# Patient Record
Sex: Female | Born: 1950 | Race: White | Hispanic: No | Marital: Married | Smoking: Never smoker
Health system: Southern US, Community
[De-identification: ages and names within clinical notes are randomized; demographics above are authoritative.]

## PROBLEM LIST (undated history)

## (undated) DIAGNOSIS — I10 Essential (primary) hypertension: Secondary | ICD-10-CM

## (undated) DIAGNOSIS — R55 Syncope and collapse: Secondary | ICD-10-CM

## (undated) DIAGNOSIS — R252 Cramp and spasm: Secondary | ICD-10-CM

## (undated) DIAGNOSIS — Z9289 Personal history of other medical treatment: Secondary | ICD-10-CM

## (undated) DIAGNOSIS — J349 Unspecified disorder of nose and nasal sinuses: Secondary | ICD-10-CM

## (undated) DIAGNOSIS — R911 Solitary pulmonary nodule: Secondary | ICD-10-CM

## (undated) DIAGNOSIS — C4491 Basal cell carcinoma of skin, unspecified: Secondary | ICD-10-CM

## (undated) DIAGNOSIS — I208 Other forms of angina pectoris: Secondary | ICD-10-CM

## (undated) DIAGNOSIS — I2089 Other forms of angina pectoris: Secondary | ICD-10-CM

## (undated) DIAGNOSIS — E7800 Pure hypercholesterolemia, unspecified: Secondary | ICD-10-CM

## (undated) DIAGNOSIS — Z9889 Other specified postprocedural states: Secondary | ICD-10-CM

## (undated) DIAGNOSIS — C4490 Unspecified malignant neoplasm of skin, unspecified: Secondary | ICD-10-CM

## (undated) HISTORY — PX: CARDIAC CATHETERIZATION: SHX172

## (undated) HISTORY — DX: Other forms of angina pectoris: I20.89

## (undated) HISTORY — DX: Other forms of angina pectoris: I20.8

## (undated) HISTORY — PX: ABDOMINAL HYSTERECTOMY: SHX81

## (undated) HISTORY — DX: Unspecified disorder of nose and nasal sinuses: J34.9

## (undated) HISTORY — PX: CHOLECYSTECTOMY: SHX55

## (undated) HISTORY — DX: Syncope and collapse: R55

## (undated) HISTORY — PX: BASAL CELL CARCINOMA EXCISION: SHX1214

## (undated) HISTORY — DX: Essential (primary) hypertension: I10

## (undated) HISTORY — DX: Pure hypercholesterolemia, unspecified: E78.00

## (undated) HISTORY — DX: Personal history of other medical treatment: Z92.89

## (undated) HISTORY — DX: Basal cell carcinoma of skin, unspecified: C44.91

## (undated) HISTORY — DX: Solitary pulmonary nodule: R91.1

## (undated) HISTORY — DX: Cramp and spasm: R25.2

## (undated) HISTORY — PX: SQUAMOUS CELL CARCINOMA EXCISION: SHX2433

## (undated) HISTORY — DX: Other specified postprocedural states: Z98.890

## (undated) HISTORY — DX: Unspecified malignant neoplasm of skin, unspecified: C44.90

---

## 1998-05-04 ENCOUNTER — Other Ambulatory Visit: Admission: RE | Admit: 1998-05-04 | Discharge: 1998-05-04 | Payer: Self-pay | Admitting: *Deleted

## 2000-01-19 ENCOUNTER — Encounter: Admission: RE | Admit: 2000-01-19 | Discharge: 2000-01-19 | Payer: Self-pay | Admitting: Emergency Medicine

## 2000-01-19 ENCOUNTER — Encounter: Payer: Self-pay | Admitting: Emergency Medicine

## 2000-02-21 HISTORY — PX: CARDIAC CATHETERIZATION: SHX172

## 2000-05-09 ENCOUNTER — Other Ambulatory Visit: Admission: RE | Admit: 2000-05-09 | Discharge: 2000-05-09 | Payer: Self-pay | Admitting: *Deleted

## 2000-05-11 ENCOUNTER — Ambulatory Visit (HOSPITAL_COMMUNITY): Admission: RE | Admit: 2000-05-11 | Discharge: 2000-05-11 | Payer: Self-pay | Admitting: Cardiology

## 2000-05-29 ENCOUNTER — Ambulatory Visit (HOSPITAL_COMMUNITY): Admission: RE | Admit: 2000-05-29 | Discharge: 2000-05-29 | Payer: Self-pay | Admitting: Cardiology

## 2000-05-29 ENCOUNTER — Encounter: Payer: Self-pay | Admitting: Cardiology

## 2000-06-15 ENCOUNTER — Encounter: Payer: Self-pay | Admitting: Emergency Medicine

## 2000-06-15 ENCOUNTER — Encounter: Admission: RE | Admit: 2000-06-15 | Discharge: 2000-06-15 | Payer: Self-pay | Admitting: Emergency Medicine

## 2000-07-13 ENCOUNTER — Encounter: Payer: Self-pay | Admitting: Emergency Medicine

## 2000-07-13 ENCOUNTER — Encounter: Admission: RE | Admit: 2000-07-13 | Discharge: 2000-07-13 | Payer: Self-pay | Admitting: Emergency Medicine

## 2000-11-01 ENCOUNTER — Ambulatory Visit (HOSPITAL_COMMUNITY): Admission: RE | Admit: 2000-11-01 | Discharge: 2000-11-01 | Payer: Self-pay | Admitting: Cardiology

## 2002-04-10 ENCOUNTER — Encounter: Payer: Self-pay | Admitting: Emergency Medicine

## 2002-04-10 ENCOUNTER — Encounter: Admission: RE | Admit: 2002-04-10 | Discharge: 2002-04-10 | Payer: Self-pay | Admitting: Emergency Medicine

## 2002-04-10 ENCOUNTER — Other Ambulatory Visit: Admission: RE | Admit: 2002-04-10 | Discharge: 2002-04-10 | Payer: Self-pay | Admitting: *Deleted

## 2003-04-23 ENCOUNTER — Encounter: Admission: RE | Admit: 2003-04-23 | Discharge: 2003-04-23 | Payer: Self-pay | Admitting: Emergency Medicine

## 2003-06-02 ENCOUNTER — Encounter: Admission: RE | Admit: 2003-06-02 | Discharge: 2003-06-02 | Payer: Self-pay | Admitting: Emergency Medicine

## 2003-06-04 ENCOUNTER — Other Ambulatory Visit: Admission: RE | Admit: 2003-06-04 | Discharge: 2003-06-04 | Payer: Self-pay | Admitting: Obstetrics and Gynecology

## 2006-02-20 DIAGNOSIS — R55 Syncope and collapse: Secondary | ICD-10-CM

## 2006-02-20 HISTORY — DX: Syncope and collapse: R55

## 2006-03-15 ENCOUNTER — Encounter: Admission: RE | Admit: 2006-03-15 | Discharge: 2006-03-15 | Payer: Self-pay | Admitting: Cardiology

## 2007-01-08 ENCOUNTER — Encounter: Admission: RE | Admit: 2007-01-08 | Discharge: 2007-01-08 | Payer: Self-pay | Admitting: Obstetrics and Gynecology

## 2008-02-17 ENCOUNTER — Encounter: Admission: RE | Admit: 2008-02-17 | Discharge: 2008-02-17 | Payer: Self-pay | Admitting: Obstetrics and Gynecology

## 2008-09-06 ENCOUNTER — Ambulatory Visit: Payer: Self-pay | Admitting: Internal Medicine

## 2008-09-07 ENCOUNTER — Inpatient Hospital Stay (HOSPITAL_COMMUNITY): Admission: EM | Admit: 2008-09-07 | Discharge: 2008-09-07 | Payer: Self-pay | Admitting: Emergency Medicine

## 2010-01-12 ENCOUNTER — Ambulatory Visit: Payer: Self-pay | Admitting: Cardiology

## 2010-05-26 ENCOUNTER — Other Ambulatory Visit: Payer: Self-pay | Admitting: Obstetrics and Gynecology

## 2010-05-26 DIAGNOSIS — Z1231 Encounter for screening mammogram for malignant neoplasm of breast: Secondary | ICD-10-CM

## 2010-05-29 LAB — TROPONIN I: Troponin I: 0.01 ng/mL (ref 0.00–0.06)

## 2010-05-29 LAB — CARDIAC PANEL(CRET KIN+CKTOT+MB+TROPI)
CK, MB: 2.9 ng/mL (ref 0.3–4.0)
Relative Index: 1.2 (ref 0.0–2.5)
Total CK: 236 U/L — ABNORMAL HIGH (ref 7–177)
Troponin I: 0.02 ng/mL (ref 0.00–0.06)

## 2010-05-29 LAB — DIFFERENTIAL
Basophils Absolute: 0 10*3/uL (ref 0.0–0.1)
Basophils Relative: 0 % (ref 0–1)
Eosinophils Absolute: 0.2 10*3/uL (ref 0.0–0.7)
Eosinophils Relative: 4 % (ref 0–5)
Lymphocytes Relative: 44 % (ref 12–46)
Lymphs Abs: 2 10*3/uL (ref 0.7–4.0)
Monocytes Absolute: 0.4 10*3/uL (ref 0.1–1.0)
Monocytes Relative: 9 % (ref 3–12)
Neutro Abs: 2 10*3/uL (ref 1.7–7.7)
Neutrophils Relative %: 43 % (ref 43–77)

## 2010-05-29 LAB — POCT CARDIAC MARKERS
CKMB, poc: 2.2 ng/mL (ref 1.0–8.0)
Myoglobin, poc: 69.2 ng/mL (ref 12–200)
Troponin i, poc: <0.05 ng/mL (ref 0.00–0.09)
Troponin i, poc: <0.05 ng/mL (ref 0.00–0.09)

## 2010-05-29 LAB — BASIC METABOLIC PANEL
BUN: 8 mg/dL (ref 6–23)
Chloride: 109 mEq/L (ref 96–112)
Glucose, Bld: 96 mg/dL (ref 70–99)
Potassium: 3.5 mEq/L (ref 3.5–5.1)

## 2010-05-29 LAB — CBC
HCT: 37.5 % (ref 36.0–46.0)
Hemoglobin: 12.8 g/dL (ref 12.0–15.0)
MCHC: 34.2 g/dL (ref 30.0–36.0)
MCV: 88.6 fL (ref 78.0–100.0)
Platelets: 188 10*3/uL (ref 150–400)
RBC: 4.23 MIL/uL (ref 3.87–5.11)
RDW: 12.9 % (ref 11.5–15.5)
WBC: 4.6 10*3/uL (ref 4.0–10.5)

## 2010-05-29 LAB — CK TOTAL AND CKMB (NOT AT ARMC)
CK, MB: 3 ng/mL (ref 0.3–4.0)
Relative Index: 1.1 (ref 0.0–2.5)
Total CK: 264 U/L — ABNORMAL HIGH (ref 7–177)

## 2010-05-29 LAB — TSH: TSH: 2.358 u[IU]/mL (ref 0.350–4.500)

## 2010-06-01 ENCOUNTER — Ambulatory Visit: Payer: Self-pay

## 2010-06-03 ENCOUNTER — Ambulatory Visit
Admission: RE | Admit: 2010-06-03 | Discharge: 2010-06-03 | Disposition: A | Discharge status: Home / Self Care | Payer: PRIVATE HEALTH INSURANCE | Source: Ambulatory Visit | Attending: Obstetrics and Gynecology | Admitting: Obstetrics and Gynecology

## 2010-06-03 DIAGNOSIS — Z1231 Encounter for screening mammogram for malignant neoplasm of breast: Secondary | ICD-10-CM

## 2010-06-14 ENCOUNTER — Other Ambulatory Visit: Payer: Self-pay | Admitting: Cardiology

## 2010-06-14 DIAGNOSIS — E785 Hyperlipidemia, unspecified: Secondary | ICD-10-CM

## 2010-06-21 ENCOUNTER — Other Ambulatory Visit: Payer: Self-pay | Admitting: *Deleted

## 2010-06-21 DIAGNOSIS — E7800 Pure hypercholesterolemia, unspecified: Secondary | ICD-10-CM

## 2010-06-29 ENCOUNTER — Other Ambulatory Visit: Payer: Self-pay | Admitting: Cardiology

## 2010-07-05 NOTE — Discharge Summary (Signed)
[name], [name]              ACCOUNT NO.:  [id number]   MEDICAL RECORD NO.:  [id number]          PATIENT TYPE:  INP   LOCATION:  6532                         FACILITY:  [facility]   PHYSICIAN:  Colleen Can. Deborah Chalk, M.D.DATE OF BIRTH:  [birth year]   DATE OF ADMISSION:  09/06/2008  DATE OF DISCHARGE:  09/07/2008                               DISCHARGE SUMMARY   DISCHARGE DIAGNOSES:  1. Dizziness and headache with probable vasovagal response.  2. Hypertension.  3. 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  4. Hyperlipidemia.   HISTORY OF PRESENT ILLNESS:  [name] is a 60 year old white female who has  had a longstanding history of hypertension, hyperlipidemia as well as  being overweight.  She had cardiac catheterization in 2002 and had  normal coronaries, but has suffered from intermittent chest pain since  that time and is felt to have some degree of microvascular angina.  She  presents to the emergency room after having a headache that progressed  into dizziness associated with bilateral arm numbness and some mild  chest discomfort.  Her blood pressure was checked and was noted to be  over 200 systolic.  Her symptoms occurred while she was at church.  She  had been under more stress and being involved with more committee work  as well as most recently with [employer].  She was brought to  the hospital and was subsequently watched overnight for further  evaluation.   Please see the history and physical for further patient presentation and  profile.   LABORATORY DATA ON ADMISSION:  Her CBC was completely within normal  limits.  Her chemistries were within normal limits as well.  Troponins  were negative x2.  CK-MBs were negative x2.  TSH level is still pending.  Her chest x-ray showed no acute cardiopulmonary process.  Her CT scan of  the head showed no acute process except for mild sinusitis.  Her EKG was  unremarkable.   HOSPITAL COURSE:  The patient was admitted overnight.  Her blood  pressure was in the 140 systolic range.  She was started on some low-  dose hydrochlorothiazide.  She ruled out negative for myocardial  infarction.  By the following morning, her symptoms had basically abated  and she was back to her baseline.  She is felt to be a satisfactory  candidate for discharge today.  It will be our plan for her to have  outpatient stress testing later this week.   DISCHARGE CONDITION:  Stable.   DISCHARGE DIET:  Low-salt heart-healthy.   DISCHARGE MEDICATIONS:  We will be adding hydrochlorothiazide 25 mg a  day.  She will continue aspirin 81 mg a day, Lovaza 1 g three times a  day, Diovan 80 mg half a tablet daily, metoprolol ER 25 mg a day,  Crestor 5 mg a day, and Welchol 625 three tablets at bedtime.   Our office will make arrangements for her to have stress testing later  on this week.  We will then see her back in approximately 10 days,  certainly sooner  if any problems would arise in the interim.   Greater than 30 minutes was spent for discharge.      Sharlee Blew, N.P.      Colleen Can. Deborah Chalk, M.D.  Electronically Signed    LC/MEDQ  D:  09/07/2008  T:  09/07/2008  Job:  [id number]

## 2010-07-05 NOTE — H&P (Signed)
[name], [name] NO.:  [id number]   MEDICAL RECORD NO.:  [id number]          PATIENT TYPE:  EMS   LOCATION:  MAJO                         FACILITY:  [facility]   PHYSICIAN:  Wendi Snipes, MD DATE OF BIRTH:  Mar 12, 1950   DATE OF ADMISSION:  09/06/2008  DATE OF DISCHARGE:                              HISTORY & PHYSICAL   CARDIOLOGIST:  Colleen Can. Deborah Chalk, M.D.   CHIEF COMPLAINTS:  Dizziness and headache.   HISTORY OF PRESENT ILLNESS:  This is a 60 year old white female with a  history of chronic angina and hypertension who presents with dizziness  with the feeling of passing out, transient hypertension, and headache.  She states that she was sitting in church when the symptoms occurred.  The symptoms started with a headache, progressed to dizziness.  This was  associated with arm numbness and some mild chest discomfort.  She does  see Dr. Deborah Chalk for chronic angina and she had a normal cardiac  catheterization in 2002.  She never lost consciousness fully.  However,  blood pressure was checked and her systolic  was approximately 200.  She  does not report any otherwise, syncope, palpitations, increased lower  extremity edema, palpable paroxysmal nocturnal dyspnea, or orthopnea.  She also denies recent illnesses though she has been stressed at work  lately with the [employer].   PAST MEDICAL HISTORY:  1. Chronic angina, normal catheterization in 2002.  2. Hypertension.  3. Hyperlipidemia.   ALLERGIES:  No known drug allergies.   MEDICATIONS ON ADMISSION:  WelChol, Lovasa, Crestor, Lopressor, aspirin.  She did not know the doses   SOCIAL HISTORY:  She lives in [place] with her husband.  She works at  her church.  She does not smoke.   FAMILY HISTORY:  Mother had a myocardial infarction in her the 36s.  Her  father had his first myocardial infarction his 81s.   REVIEW OF SYSTEMS:  All 14 systems were reviewed, were negative except  as  mentioned in detail in the HPI.   PHYSICAL EXAMINATION:  VITAL SIGNS:  Blood pressure is 143/79,  respiratory rate is 16.  Her pulse is 59.  Oxygen saturation 98% on 2  liters nasal cannula.  GENERAL:  She is a 60 year old white female appearing her stated age, in  no acute distress.  HEENT:  Moist mucous membranes.  Pupils are equal, round, react to light  and accommodation.  Anicteric sclera.  NECK:  No jugular venous distention.  No thyromegaly.  CARDIOVASCULAR:  Regular rate and rhythm.  No murmurs, rubs or gallops.  LUNGS:  Clear to auscultation bilaterally.  ABDOMEN:  Nontender, nondistended.  Positive bowel sounds.  No masses.  EXTREMITIES:  No clubbing, cyanosis, or edema and 2+ pulses throughout.  NEUROLOGIC:  Alert and oriented x3.  Cranial nerves II-XII grossly  intact.  No focal neurologic deficits.  SKIN:  Warm, dry and intact.  No  rashes.  PSYCHIATRIC:  Mood and affect are appropriate.   RADIOLOGY:  Head CT without contrast showed no acute process with mild  sinusitis.  Chest x-ray showed no acute  cardiopulmonary process.   LABORATORY:  White blood cell count is 4.6, hematocrit is 37.5.  Her  platelet count is 188,000.  Her potassium is 3.5.  Her creatinine is  0.9.  Her cardiac markers are negative.   ASSESSMENT/PLAN:  This is a 60 year old white female with a history of  hypertension and angina, here with presyncope that was associated with  high blood pressure.  1. Presyncope.  This is very likely a vasovagal reaction;  however,      will check echocardiogram and thyroid panel.  Consider 24-hour      urine metanephrines to rule out pheochromocytoma if the symptoms      recur.  2. Hypertension.  Her blood pressure is currently mildly elevated.  We      add hydrochlorothiazide 25 mg daily to her regimen.      Wendi Snipes, MD  Electronically Signed     BHH/MEDQ  D:  09/07/2008  T:  09/07/2008  Job:  [id number]

## 2010-07-08 NOTE — Cardiovascular Report (Signed)
[place]  Patient:    [name], [name]                     MRN: [id number] Proc. Date: 05/11/00 Adm. Date:  [id number] Disc. Date: [id number] Attending:  Eleanora Neighbor                        Cardiac Catheterization  INDICATIONS:  Ms. Clifton James was referred for evaluation of a classical, exertional, anterior chest pain, classical for angina pectoris.  She has a history of hypertension.  She also has a history of hypercholesterolemia.  She has a strongly positive family history of heart disease.  PROCEDURE:  Left heart catheterization with selective coronary angiography, left ventricular angiography with abdominal aortography.  TYPE AND SITE OF ENTRY:  Percutaneous right femoral artery with Perclose.  CATHETERS:  A 6 French 4 curved Judkins right and left coronary catheters, 6 French pigtail ventriculographic catheter.  CONTRAST MATERIAL:  Omnipaque.  MEDICATIONS GIVEN PRIOR TO THE PROCEDURE:  Valium 10 mg p.o.  MEDICATIONS GIVEN DURING THE PROCEDURE:  Versed 2 mg IV.  COMMENTS:  The patient tolerated the procedure well.  HEMODYNAMIC DATA:  The aortic pressure was 129/68, LV is 130/16.  There was no aortic valve gradient noted on pullback.  ANGIOGRAPHIC DATA: 1. Left main:  Left main coronary artery is normal. 2. Left anterior descending:  The left anterior descending is a moderate sized    vessel that extends to and around the apex.  Somewhat tortuous distally.    There is a large diagonal vessel.  It is otherwise normal. 3. Intermediate coronary artery is normal. 4. Left circumflex:  Left circumflex is small but normal. 5. Right coronary artery:  Right coronary artery is a large dominant    vessel.  It is normal.  LEFT VENTRICULOGRAPHY:  Left ventricular angiogram was performed in the RAO position.  Overall cardiac size and silhouette were normal.  Left ventricular function was normal.  ABDOMINAL AORTOGRAM:  Abdominal aortogram  was performed using 30 cc of contrast at 20 cc/sec.  This revealed what was felt to be a significant right renal artery stenosis in the midportion of the vessel.  There was a LAO hand injection taken and it was very suggestive that that was in fact the situation.  It is hard to really define the exact severity of this lesion, but it dose appear to be quite focal in nature.  OVERALL IMPRESSION: 1. Normal left ventricular function. 2. Normal coronary arteries. 3. Probable right renal artery stenosis. DD:  05/11/00 TD:  05/14/00 Job: [id number]

## 2010-07-20 ENCOUNTER — Encounter: Payer: Self-pay | Admitting: *Deleted

## 2010-07-21 ENCOUNTER — Encounter: Payer: Self-pay | Admitting: Cardiology

## 2010-07-21 ENCOUNTER — Other Ambulatory Visit: Payer: Self-pay | Admitting: Cardiology

## 2010-07-21 ENCOUNTER — Ambulatory Visit (INDEPENDENT_AMBULATORY_CARE_PROVIDER_SITE_OTHER): Payer: PRIVATE HEALTH INSURANCE | Admitting: Cardiology

## 2010-07-21 ENCOUNTER — Other Ambulatory Visit (INDEPENDENT_AMBULATORY_CARE_PROVIDER_SITE_OTHER): Payer: PRIVATE HEALTH INSURANCE | Admitting: *Deleted

## 2010-07-21 DIAGNOSIS — E7800 Pure hypercholesterolemia, unspecified: Secondary | ICD-10-CM

## 2010-07-21 DIAGNOSIS — I209 Angina pectoris, unspecified: Secondary | ICD-10-CM | POA: Insufficient documentation

## 2010-07-21 DIAGNOSIS — I10 Essential (primary) hypertension: Secondary | ICD-10-CM

## 2010-07-21 DIAGNOSIS — E785 Hyperlipidemia, unspecified: Secondary | ICD-10-CM

## 2010-07-21 DIAGNOSIS — E782 Mixed hyperlipidemia: Secondary | ICD-10-CM | POA: Insufficient documentation

## 2010-07-21 LAB — HEPATIC FUNCTION PANEL
ALT: 25 U/L (ref 0–35)
AST: 34 U/L (ref 0–37)
Total Protein: 7 g/dL (ref 6.0–8.3)

## 2010-07-21 LAB — LDL CHOLESTEROL, DIRECT: Direct LDL: 159.9 mg/dL

## 2010-07-21 LAB — LIPID PANEL
Cholesterol: 225 mg/dL — ABNORMAL HIGH (ref 0–200)
HDL: 57 mg/dL (ref >39.00)
Total CHOL/HDL Ratio: 4
VLDL: 35.6 mg/dL (ref 0.0–40.0)

## 2010-07-21 LAB — BASIC METABOLIC PANEL
BUN: 13 mg/dL (ref 6–23)
Chloride: 104 mEq/L (ref 96–112)
Potassium: 3.6 mEq/L (ref 3.5–5.1)

## 2010-07-21 NOTE — Progress Notes (Signed)
Subjective:   [name] is seen today for followup visit. She has carried a diagnosis of microvascular angina with remote catheterization in 2002 which was normal. She had a stress Cardiolite study in July of 2010 which was normal. She had a vasovagal response in July of 2010 leading to hospitalization. In general, she has done well with weight loss and exercise program that has resulted in better control of blood pressure and hyperlipemia.  She currently has fatigue and shortness of breath. She has occasional chest discomfort but overall is doing reasonably well. 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 and in the past, that has led to periods of recurrent chest pain. I've encouraged her to continue to exercise and lose weight. She continues to have recurrent chest discomfort, she may be a candidate for repeat catheterization.  Current Outpatient Prescriptions  Medication Sig Dispense Refill  . aspirin 81 MG tablet Take 81 mg by mouth daily.        . Cetirizine HCl (ZYRTEC PO) Take 1 tablet by mouth as needed.        . colesevelam (WELCHOL) 625 MG tablet Take 1,875 mg by mouth at bedtime.        . hydrochlorothiazide 25 MG tablet Take 25 mg by mouth daily.        . metoprolol succinate (TOPROL-XL) 25 MG 24 hr tablet 25 mg daily.       Marland Kitchen omega-3 acid ethyl esters (LOVAZA) 1 G capsule Take 2 g by mouth at bedtime.        . rosuvastatin (CRESTOR) 5 MG tablet 1/2 TABLET EVERY OTHER DAY OR AS DIRECTED  15 each  3  . valsartan (DIOVAN) 40 MG tablet Take 20 mg by mouth daily.        Marland Kitchen DISCONTD: metoprolol succinate (TOPROL-XL) 25 MG 24 hr tablet TAKE ONE TABLET BY MOUTH TWICE DAILY  60 tablet  6    No Known Allergies  There is no problem list on file for this patient.   History  Smoking status  . Never Smoker   Smokeless tobacco  . Never Used    History  Alcohol Use No    Family History  Problem Relation Age of Onset  . Heart disease Father   . Heart attack  Mother     questionable  . Lung cancer Mother   . Coronary artery disease      strong family history     Review of Systems:   The patient denies any heat or cold intolerance.  No weight gain or weight loss.  The patient denies headaches or blurry vision.  There is no cough or sputum production.  The patient denies dizziness.  There is no hematuria or hematochezia.  The patient denies any muscle aches or arthritis.  The patient denies any rash.  The patient denies frequent falling or instability.  There is no history of depression or anxiety.  All other systems were reviewed and are negative.   Physical Exam:   Weight is 171. Blood pressure 116/70 sitting, heart rate 60.The head is normocephalic and atraumatic.  Pupils are equally round and reactive to light.  Sclerae nonicteric.  Conjunctiva is clear.  Oropharynx is unremarkable.  There's adequate oral airway.  Neck is supple there are no masses.  Thyroid is not enlarged.  There is no lymphadenopathy.  Lungs are clear.  Chest is symmetric.  Heart shows a regular rate and rhythm.  S1 and S2 are normal.  There is no murmur click or gallop.  Abdomen is soft normal bowel sounds.  There is no organomegaly.  Genital and rectal deferred.  Extremities are without edema.  Peripheral pulses are adequate.  Neurologically intact.  Full range of motion.  The patient is not depressed.  Skin is warm and dry.  Assessment / Plan:

## 2010-07-21 NOTE — Assessment & Plan Note (Signed)
We'll continue Crestor. We'll defer management of lipids to primary care with Dr.Badger

## 2010-07-21 NOTE — Assessment & Plan Note (Signed)
She has long history of intermittent chest discomfort and shortness of breath and fatigue. She had a negative catheterization was similar symptoms in 2002 and has had followup negative stress Cardiolite studies. We have treated this as a microvascular angina and she has improved with exercise and weight loss. I've encouraged her to return exercise and allow enough margin in her life for some appropriate rest.

## 2010-07-29 ENCOUNTER — Telehealth: Payer: Self-pay | Admitting: *Deleted

## 2010-07-29 NOTE — Telephone Encounter (Signed)
Message copied by Adolphus Birchwood on Fri Jul 29, 2010  2:00 PM ------      Message from: Roger Shelter      Created: Wed Jul 27, 2010  8:43 AM       Work on diet, recheck in six months.

## 2010-07-29 NOTE — Telephone Encounter (Signed)
Left message on cell # with lab results and to continue to work on diet.  Pt told to call back with any concerns and to have labs rechecked in six months.

## 2010-08-16 ENCOUNTER — Other Ambulatory Visit: Payer: Self-pay | Admitting: Cardiology

## 2010-10-19 ENCOUNTER — Ambulatory Visit (INDEPENDENT_AMBULATORY_CARE_PROVIDER_SITE_OTHER): Payer: PRIVATE HEALTH INSURANCE | Admitting: Cardiology

## 2010-10-19 ENCOUNTER — Encounter: Payer: Self-pay | Admitting: Cardiology

## 2010-10-19 DIAGNOSIS — R079 Chest pain, unspecified: Secondary | ICD-10-CM

## 2010-10-19 DIAGNOSIS — I10 Essential (primary) hypertension: Secondary | ICD-10-CM

## 2010-10-19 DIAGNOSIS — I209 Angina pectoris, unspecified: Secondary | ICD-10-CM

## 2010-10-19 DIAGNOSIS — I208 Other forms of angina pectoris: Secondary | ICD-10-CM

## 2010-10-19 DIAGNOSIS — E785 Hyperlipidemia, unspecified: Secondary | ICD-10-CM

## 2010-10-19 MED ORDER — NITROGLYCERIN 0.4 MG SL SUBL: 0.4000 mg | SUBLINGUAL_TABLET | SUBLINGUAL | Status: DC | PRN

## 2010-10-19 MED ORDER — ROSUVASTATIN CALCIUM 5 MG PO TABS: ORAL_TABLET | ORAL | Status: DC

## 2010-10-19 MED ORDER — ISOSORBIDE MONONITRATE ER 30 MG PO TB24: 30.0000 mg | ORAL_TABLET | Freq: Every day | ORAL | Status: DC

## 2010-10-19 NOTE — Assessment & Plan Note (Addendum)
Patient has chronic chest pain with moderate to heavy exertion.   This has been present for over 10 years.  She is currently at her baseline.  She had a normal cath in 3/02 and a normal cardiolite in 2012.  She is thought to have microvascular angina.  It is responsive to nitrates.  She is on a beta blocker, ARB, and low dose statin.  I am going to have her try Imdur 30 mg daily to see if it helps her symptoms.  If this does not help, ranolazine may be an option.  She should continue ASA 81 mg daily.

## 2010-10-19 NOTE — Patient Instructions (Signed)
Increase Crestor to 5mg  every other day.  Start Imdur (isosorbide) 30mg  daily.   Use nitroglycerin as needed for chest pain.  Schedule an appointment for fasting lab in 2 months--lipid profile/liver profile 786.50  413.0  Your physician wants you to follow-up in: 6 months with Dr Shirlee Latch. (February [id number]. You will receive a reminder letter in the mail two months in advance. If you don't receive a letter, please call our office to schedule the follow-up appointment.

## 2010-10-19 NOTE — Assessment & Plan Note (Signed)
Patient has probable microvascular angina as well as significantly elevated LDL.  She has a possible family history of premature CAD.  I think that her LDL should at least be below 100.  I am going to increase her statin to Crestor 5 mg qod with repeat lipids/LFTs in 2 months.  If she tolerates this, increase to 5 mg daily.  Increased statin may also help with microvascular angina via its positive effect on the endothelium.

## 2010-10-19 NOTE — Progress Notes (Signed)
PCP: Dr. Cyndia Bent  60 yo with history of microvascular angina and hyperlipidemia presents for cardiology followup.  She has been seen by Dr. Deborah Chalk in the past and is seen by me for the first time today.  Earlier in the summer, she had been having more chest pain symptoms.  Over the last few weeks, she has returned to baseline.  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  She has had chest pain for over 10 years.  She will get tightness in her chest sometimes radiating to the left arm as well as dyspnea when walking up hills or if she carries a heavy load up steps.  This tends to resolve if she takes nitroglycerine.  She walks 30-40 minutes a day for exercise with her husband and had no chest pain with this.  She rarely (1-2 times a year) gets a severe episode of chest pain at rest.  She had one of these several weeks ago.    She has had trouble in the past tolerating statins.  She is doing well now on a very low dose of Crestor but LDL is still high.   Labs (5/12): K 3.6, creatinine 0.9, TGs 178, HDL 57, LDL 160  ECG: NSR, normal  PMH: 1. Microvascular angina: Patient had left heart cath in 3/02 with normal coronaries.  She had a stress cardiolite in 7/10 that was normal.   2. Right renal artery stenosis noted on 3/12 cath 3. HTN 4. Vasovagal symptoms 5. Hyperlipidemia: Myalgias with several statins, tolerating low dose Crestor.   SH: Married, lives with husband [place] of [place].  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  3 daughters.  Nonsmoker, no ETOH.   FH: Mother died at 98 with lung cancer, she thinks mother had an MI in her 30s.  Father had an MI in his 8s.   ROS: All systems reviewed and negative except as per HPI.   Current Outpatient Prescriptions  Medication Sig Dispense Refill  . aspirin 81 MG tablet Take 81 mg by mouth daily.        . Cetirizine HCl (ZYRTEC PO) Take 1 tablet by mouth as needed.         . hydrochlorothiazide 25 MG tablet Take 25 mg by mouth daily.        . metoprolol succinate (TOPROL-XL) 25 MG 24 hr tablet 25 mg daily.       Marland Kitchen omega-3 acid ethyl esters (LOVAZA) 1 G capsule Take 2 g by mouth at bedtime.        . valsartan (DIOVAN) 80 MG tablet Take 20 mg by mouth daily.        . WELCHOL 625 MG tablet TAKE THREE TABLETS BY MOUTH EVERY DAY.  90 each  5  . DISCONTD: rosuvastatin (CRESTOR) 5 MG tablet 1/2 TABLET EVERY OTHER DAY OR AS DIRECTED  15 each  3  . isosorbide mononitrate (IMDUR) 30 MG 24 hr tablet Take 1 tablet (30 mg total) by mouth daily.  30 tablet  6  . nitroGLYCERIN (NITROSTAT) 0.4 MG SL tablet Place 1 tablet (0.4 mg total) under the tongue every 5 (five) minutes as needed for chest pain.  100 tablet  3  . rosuvastatin (CRESTOR) 5 MG tablet Take one tablet every other day  15 tablet  3    BP 104/69  Pulse 63  Ht 5\' 4"  (1.626 m)  Wt 172 lb  12.8 oz (78.382 kg)  BMI 29.66 kg/m2 General: NAD Neck: No JVD, no thyromegaly or thyroid nodule.  Lungs: Clear to auscultation bilaterally with normal respiratory effort. CV: Nondisplaced PMI.  Heart regular S1/S2, no S3/S4, no murmur.  No peripheral edema.  No carotid bruit.  Normal pedal pulses.  Abdomen: Soft, nontender, no hepatosplenomegaly, no distention.  Neurologic: Alert and oriented x 3.  Psych: Normal affect. Extremities: No clubbing or cyanosis.

## 2010-10-19 NOTE — Assessment & Plan Note (Signed)
BP is under good control.  Patient has possible right renal artery stenosis, but at this point I do not think there is an indication for further evaluation given well-controlled BP and normal creatinine.   Followup in 6 months.

## 2010-10-28 ENCOUNTER — Other Ambulatory Visit: Payer: Self-pay | Admitting: Nurse Practitioner

## 2010-12-15 ENCOUNTER — Other Ambulatory Visit (INDEPENDENT_AMBULATORY_CARE_PROVIDER_SITE_OTHER): Payer: PRIVATE HEALTH INSURANCE | Admitting: *Deleted

## 2010-12-15 DIAGNOSIS — I208 Other forms of angina pectoris: Secondary | ICD-10-CM

## 2010-12-15 DIAGNOSIS — R079 Chest pain, unspecified: Secondary | ICD-10-CM

## 2010-12-15 LAB — HEPATIC FUNCTION PANEL
ALT: 25 U/L (ref 0–35)
AST: 30 U/L (ref 0–37)
Alkaline Phosphatase: 45 U/L (ref 39–117)
Bilirubin, Direct: 0 mg/dL (ref 0.0–0.3)
Total Bilirubin: 0.5 mg/dL (ref 0.3–1.2)
Total Protein: 7.2 g/dL (ref 6.0–8.3)

## 2010-12-15 LAB — LIPID PANEL: Cholesterol: 196 mg/dL (ref 0–200)

## 2010-12-19 ENCOUNTER — Telehealth: Payer: Self-pay | Admitting: *Deleted

## 2010-12-19 DIAGNOSIS — E785 Hyperlipidemia, unspecified: Secondary | ICD-10-CM

## 2010-12-19 MED ORDER — ROSUVASTATIN CALCIUM 5 MG PO TABS: 5.0000 mg | ORAL_TABLET | Freq: Every day | ORAL | Status: DC

## 2010-12-19 NOTE — Telephone Encounter (Signed)
Notes Recorded by Jacqlyn Krauss, RN on 12/19/2010 at 2:36 PM Pt will increase crestor to 5mg  daily. She will return for fasting lipid/liver profile 02/16/11. Notes Recorded by Jacqlyn Krauss, RN on 12/19/2010 at 1:58 PM I discussed results with pt. Notes Recorded by Marca Ancona, MD on 12/18/2010 at 11:04 PM Better LDL. Would increase Crestor to 5 mg daily and check lipids/LFTs in 2 months.

## 2011-02-07 ENCOUNTER — Other Ambulatory Visit: Payer: Self-pay | Admitting: Nurse Practitioner

## 2011-02-07 DIAGNOSIS — R079 Chest pain, unspecified: Secondary | ICD-10-CM

## 2011-02-07 DIAGNOSIS — E785 Hyperlipidemia, unspecified: Secondary | ICD-10-CM

## 2011-02-07 DIAGNOSIS — I208 Other forms of angina pectoris: Secondary | ICD-10-CM

## 2011-02-07 MED ORDER — VALSARTAN 80 MG PO TABS: 20.0000 mg | ORAL_TABLET | Freq: Every day | ORAL | Status: DC

## 2011-02-07 MED ORDER — METOPROLOL SUCCINATE ER 25 MG PO TB24: 25.0000 mg | ORAL_TABLET | Freq: Every day | ORAL | Status: DC

## 2011-02-07 MED ORDER — OMEGA-3-ACID ETHYL ESTERS 1 G PO CAPS: 2.0000 g | ORAL_CAPSULE | Freq: Every day | ORAL | Status: DC

## 2011-02-07 MED ORDER — COLESEVELAM HCL 625 MG PO TABS: 1875.0000 mg | ORAL_TABLET | Freq: Every day | ORAL | Status: DC

## 2011-02-07 MED ORDER — ROSUVASTATIN CALCIUM 5 MG PO TABS: 5.0000 mg | ORAL_TABLET | Freq: Every day | ORAL | Status: DC

## 2011-02-16 ENCOUNTER — Other Ambulatory Visit: Payer: PRIVATE HEALTH INSURANCE | Admitting: *Deleted

## 2011-02-17 ENCOUNTER — Other Ambulatory Visit: Payer: Self-pay | Admitting: Cardiology

## 2011-04-12 ENCOUNTER — Other Ambulatory Visit (INDEPENDENT_AMBULATORY_CARE_PROVIDER_SITE_OTHER): Payer: PRIVATE HEALTH INSURANCE

## 2011-04-12 DIAGNOSIS — E785 Hyperlipidemia, unspecified: Secondary | ICD-10-CM

## 2011-04-12 LAB — LIPID PANEL
Cholesterol: 170 mg/dL (ref 0–200)
Total CHOL/HDL Ratio: 3
Triglycerides: 203 mg/dL — ABNORMAL HIGH (ref 0.0–149.0)

## 2011-04-12 LAB — HEPATIC FUNCTION PANEL
ALT: 26 U/L (ref 0–35)
Albumin: 4 g/dL (ref 3.5–5.2)
Total Bilirubin: 0.4 mg/dL (ref 0.3–1.2)

## 2011-04-19 ENCOUNTER — Ambulatory Visit (INDEPENDENT_AMBULATORY_CARE_PROVIDER_SITE_OTHER): Payer: PRIVATE HEALTH INSURANCE | Admitting: Cardiology

## 2011-04-19 ENCOUNTER — Encounter: Payer: Self-pay | Admitting: Cardiology

## 2011-04-19 VITALS — BP 110/78 | HR 63 | Ht 64.0 in | Wt 179.4 lb

## 2011-04-19 DIAGNOSIS — R079 Chest pain, unspecified: Secondary | ICD-10-CM

## 2011-04-19 DIAGNOSIS — E785 Hyperlipidemia, unspecified: Secondary | ICD-10-CM

## 2011-04-19 DIAGNOSIS — I209 Angina pectoris, unspecified: Secondary | ICD-10-CM

## 2011-04-19 NOTE — Patient Instructions (Addendum)
Your physician recommends that you return for  a FASTING lipid profile /BMET 786.50  This is scheduled for Tuesday August 27,2013 after 8:30am and before 1:30pm  Your physician wants you to follow-up in: 6 months with Dr Shirlee Latch.(August 2013 --a day or two after your lab on August 27,2013) You will receive a reminder letter in the mail two months in advance. If you don't receive a letter, please call our office to schedule the follow-up appointment.

## 2011-04-20 ENCOUNTER — Other Ambulatory Visit: Payer: Self-pay | Admitting: Cardiology

## 2011-04-20 NOTE — Assessment & Plan Note (Signed)
Patient has chronic chest pain with moderate to heavy exertion and also at rest.  She will have severe episodes several times a year.   This has been present for over 10 years.  She is currently at her baseline.  She had a normal cath in 3/02 and a normal cardiolite in 2012.  She is thought to have microvascular angina.  It is responsive to nitrates.  She is on a beta blocker, ARB, and low dose statin. She was unable to tolerate Imdur.  Ranolazine may be an option in the future.  She should continue ASA 81 mg daily.

## 2011-04-20 NOTE — Progress Notes (Signed)
PCP: Dr. Cyndia Bent  61 yo with history of microvascular angina and hyperlipidemia presents for cardiology followup. She has had chest pain for over 10 years.  She will get tightness in her chest sometimes radiating to the left arm as well as dyspnea when walking up hills or if she carries a heavy load up steps.  This tends to resolve if she takes nitroglycerin.  She walks 30-40 minutes a day for exercise with her husband and had no chest pain with this.  She rarely (1-2 times a year) gets a severe episode of chest pain at rest.   Since last appointment, she had 2 episodes of severe chest pain.   Both were at rest and both resolved with NTG.  She has had not had any recent chest pain.  At last appointment, I tried her on Imdur but she was unable to tolerate it because it made her heart feel like it was racing.  She is tolerating Crestor, alternating 5 mg daily with 2.5 mg daily.  She gets too much leg pain if she increases the Crestor to 5 mg daily.   Labs (5/12): K 3.6, creatinine 0.9, TGs 178, HDL 57, LDL 160 Labs (2/13): HDL 58, LDL 86, TGs 203  ECG: NSR, normal  PMH: 1. Microvascular angina: Patient had left heart cath in 3/02 with normal coronaries.  She had a stress cardiolite in 7/10 that was normal.  Unable to tolerate Imdur. 2. Right renal artery stenosis noted on 3/12 cath 3. HTN 4. Vasovagal symptoms 5. Hyperlipidemia: Myalgias with several statins, tolerating low dose Crestor.   SH: Married, lives with husband [place] of [place].  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  3 daughters.  Nonsmoker, no ETOH.   FH: Mother died at 49 with lung cancer, she thinks mother had an MI in her 30s.  Father had an MI in his 53s.    Current Outpatient Prescriptions  Medication Sig Dispense Refill  . aspirin 81 MG tablet Take 81 mg by mouth daily.        . Cetirizine HCl (ZYRTEC PO) Take 1 tablet by mouth as needed.        . colesevelam (WELCHOL) 625 MG tablet Take 3 tablets (1,875 mg  total) by mouth daily.  90 tablet  6  . hydrochlorothiazide 25 MG tablet TAKE ONE TABLET BY MOUTH EVERY DAY  30 tablet  5  . metoprolol succinate (TOPROL-XL) 25 MG 24 hr tablet Take 1 tablet (25 mg total) by mouth daily.  30 tablet  6  . nitroGLYCERIN (NITROSTAT) 0.4 MG SL tablet Place 1 tablet (0.4 mg total) under the tongue every 5 (five) minutes as needed for chest pain.  100 tablet  3  . rosuvastatin (CRESTOR) 5 MG tablet Take 1 tablet (5 mg total) by mouth daily.  30 tablet  6  . valsartan (DIOVAN) 80 MG tablet Take 0.5 tablets (40 mg total) by mouth daily.  60 tablet  6  . DISCONTD: omega-3 acid ethyl esters (LOVAZA) 1 G capsule Take 2 capsules (2 g total) by mouth daily.  60 capsule  6  . LOVAZA 1 G capsule TAKE 2 CAPSULES BY MOUTH ONCE DAILY  60 capsule  6    BP 110/78  Pulse 63  Ht 5\' 4"  (1.626 m)  Wt 179 lb 6.4 oz (81.375 kg)  BMI 30.79 kg/m2 General: NAD Neck: No JVD, no thyromegaly or thyroid nodule.  Lungs: Clear to auscultation bilaterally with normal respiratory  effort. CV: Nondisplaced PMI.  Heart regular S1/S2, no S3/S4, no murmur.  No peripheral edema.  No carotid bruit.  Normal pedal pulses.  Abdomen: Soft, nontender, no hepatosplenomegaly, no distention.  Neurologic: Alert and oriented x 3.  Psych: Normal affect. Extremities: No clubbing or cyanosis.

## 2011-04-20 NOTE — Assessment & Plan Note (Signed)
Lipids are much better on current statin regimen.  I would ideally like to see LDL < 70 (known vascular disease with renal artery stenosis), but she has not been able to tolerate a higher dose of Crestor.

## 2011-05-08 ENCOUNTER — Other Ambulatory Visit: Payer: Self-pay | Admitting: Nurse Practitioner

## 2011-06-01 ENCOUNTER — Other Ambulatory Visit: Payer: Self-pay | Admitting: Cardiology

## 2011-06-01 NOTE — Telephone Encounter (Signed)
Refilled diovan 

## 2011-07-10 ENCOUNTER — Other Ambulatory Visit: Payer: Self-pay | Admitting: Cardiology

## 2011-07-12 ENCOUNTER — Other Ambulatory Visit: Payer: Self-pay | Admitting: *Deleted

## 2011-07-12 DIAGNOSIS — E785 Hyperlipidemia, unspecified: Secondary | ICD-10-CM

## 2011-07-12 MED ORDER — ROSUVASTATIN CALCIUM 5 MG PO TABS: 5.0000 mg | ORAL_TABLET | Freq: Every day | ORAL | Status: DC

## 2011-10-17 ENCOUNTER — Ambulatory Visit (INDEPENDENT_AMBULATORY_CARE_PROVIDER_SITE_OTHER): Payer: PRIVATE HEALTH INSURANCE | Admitting: *Deleted

## 2011-10-17 DIAGNOSIS — I10 Essential (primary) hypertension: Secondary | ICD-10-CM

## 2011-10-17 DIAGNOSIS — E785 Hyperlipidemia, unspecified: Secondary | ICD-10-CM

## 2011-10-17 LAB — BASIC METABOLIC PANEL
BUN: 11 mg/dL (ref 6–23)
CO2: 30 mEq/L (ref 19–32)
Chloride: 103 mEq/L (ref 96–112)
Creatinine, Ser: 0.8 mg/dL (ref 0.4–1.2)
Glucose, Bld: 91 mg/dL (ref 70–99)

## 2011-10-17 LAB — LDL CHOLESTEROL, DIRECT: Direct LDL: 102.3 mg/dL

## 2011-10-18 ENCOUNTER — Other Ambulatory Visit: Payer: Self-pay | Admitting: Nurse Practitioner

## 2011-10-19 ENCOUNTER — Other Ambulatory Visit: Payer: Self-pay | Admitting: *Deleted

## 2011-10-19 DIAGNOSIS — E876 Hypokalemia: Secondary | ICD-10-CM

## 2011-11-03 ENCOUNTER — Other Ambulatory Visit (INDEPENDENT_AMBULATORY_CARE_PROVIDER_SITE_OTHER): Payer: PRIVATE HEALTH INSURANCE

## 2011-11-03 DIAGNOSIS — E876 Hypokalemia: Secondary | ICD-10-CM

## 2011-11-03 LAB — BASIC METABOLIC PANEL
CO2: 27 mEq/L (ref 19–32)
Chloride: 104 mEq/L (ref 96–112)
Sodium: 140 mEq/L (ref 135–145)

## 2011-11-06 ENCOUNTER — Ambulatory Visit: Payer: PRIVATE HEALTH INSURANCE | Admitting: Cardiology

## 2011-11-06 ENCOUNTER — Other Ambulatory Visit: Payer: Self-pay | Admitting: Cardiology

## 2011-11-06 MED ORDER — HYDROCHLOROTHIAZIDE 25 MG PO TABS: 25.0000 mg | ORAL_TABLET | Freq: Every day | ORAL | Status: DC

## 2011-11-14 ENCOUNTER — Ambulatory Visit: Payer: PRIVATE HEALTH INSURANCE | Admitting: Cardiology

## 2011-11-16 ENCOUNTER — Emergency Department (HOSPITAL_COMMUNITY)
Admission: EM | Admit: 2011-11-16 | Discharge: 2011-11-16 | Disposition: A | Discharge status: Home / Self Care | Payer: PRIVATE HEALTH INSURANCE | Attending: Emergency Medicine | Admitting: Emergency Medicine

## 2011-11-16 ENCOUNTER — Emergency Department (HOSPITAL_COMMUNITY): Payer: PRIVATE HEALTH INSURANCE

## 2011-11-16 ENCOUNTER — Encounter (HOSPITAL_COMMUNITY): Payer: Self-pay | Admitting: Emergency Medicine

## 2011-11-16 DIAGNOSIS — Z7982 Long term (current) use of aspirin: Secondary | ICD-10-CM | POA: Insufficient documentation

## 2011-11-16 DIAGNOSIS — I209 Angina pectoris, unspecified: Secondary | ICD-10-CM

## 2011-11-16 DIAGNOSIS — I2510 Atherosclerotic heart disease of native coronary artery without angina pectoris: Secondary | ICD-10-CM | POA: Insufficient documentation

## 2011-11-16 DIAGNOSIS — E785 Hyperlipidemia, unspecified: Secondary | ICD-10-CM

## 2011-11-16 DIAGNOSIS — R079 Chest pain, unspecified: Secondary | ICD-10-CM

## 2011-11-16 DIAGNOSIS — I10 Essential (primary) hypertension: Secondary | ICD-10-CM

## 2011-11-16 LAB — CBC WITH DIFFERENTIAL/PLATELET
Basophils Absolute: 0 10*3/uL (ref 0.0–0.1)
Basophils Relative: 1 % (ref 0–1)
Eosinophils Absolute: 0.2 10*3/uL (ref 0.0–0.7)
Eosinophils Relative: 4 % (ref 0–5)
HCT: 38.8 % (ref 36.0–46.0)
Hemoglobin: 13.2 g/dL (ref 12.0–15.0)
Lymphocytes Relative: 46 % (ref 12–46)
MCH: 29.5 pg (ref 26.0–34.0)
MCHC: 34 g/dL (ref 30.0–36.0)
Monocytes Absolute: 0.4 10*3/uL (ref 0.1–1.0)
Neutro Abs: 1.8 10*3/uL (ref 1.7–7.7)
RDW: 12.9 % (ref 11.5–15.5)

## 2011-11-16 LAB — COMPREHENSIVE METABOLIC PANEL
AST: 40 U/L — ABNORMAL HIGH (ref 0–37)
Albumin: 3.8 g/dL (ref 3.5–5.2)
Calcium: 9.8 mg/dL (ref 8.4–10.5)
Chloride: 103 mEq/L (ref 96–112)
Creatinine, Ser: 0.87 mg/dL (ref 0.50–1.10)
Total Protein: 7.2 g/dL (ref 6.0–8.3)

## 2011-11-16 LAB — TROPONIN I: Troponin I: <0.3 ng/mL (ref <0.30)

## 2011-11-16 LAB — CK TOTAL AND CKMB (NOT AT ARMC)
CK, MB: 2.4 ng/mL (ref 0.3–4.0)
Relative Index: 1.5 (ref 0.0–2.5)
Total CK: 160 U/L (ref 7–177)

## 2011-11-16 LAB — POCT I-STAT TROPONIN I

## 2011-11-16 MED ORDER — POTASSIUM CHLORIDE CRYS ER 20 MEQ PO TBCR
40.0000 meq | EXTENDED_RELEASE_TABLET | Freq: Once | ORAL | Status: AC
  Administered 2011-11-16: 40 meq via ORAL
  Filled 2011-11-16: qty 2

## 2011-11-16 MED ORDER — ASPIRIN 81 MG PO CHEW
324.0000 mg | CHEWABLE_TABLET | Freq: Once | ORAL | Status: AC
  Administered 2011-11-16: 324 mg via ORAL
  Filled 2011-11-16: qty 4

## 2011-11-16 NOTE — Consult Note (Signed)
Consult Note  Patient ID: [name] MRN: [id number], SOB: 1951/01/18 61 y.o. Date of Encounter: 11/16/2011, 4:05 PM  Primary Physician: Eartha Inch, MD Primary Cardiologist: Dr. Shirlee Latch  Chief Complaint: chest pain  HPI: 61 y.o. female w/ PMHx significant for chronic microvascular angina (No CAD by cath 2002, nl myoview 2010), HTN, HLD, Syncope, and probable right renal artery stenosis by cath who presented to [facility] on 11/16/2011 with complaints of chest pain.  She was last seen in clinic by Dr. Shirlee Latch on 04/19/11 at which time it was noted she has had chest pain for over 20yrs that can occur with exertion or at rest and usually resolves with NTG. She was tried on Imdur but could not tolerate it because of palpitations. It was noted that she may benefit from Ranexa.   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 Upon return on Sunday she developed nausea, vomiting and diarrhea ~ q30 mins for ~8hrs as well as fever and achy muscles. (Other people from the trip also developed these GI symptoms). From Monday until today she felt weak and achy, but no further n/v/d. She was able to tolerate liquids, but very little food. Today was feeling better and decided to go out to eat for some soup. Around 1pm she was in the car going out to eat when she developed sudden onset substernal chest pressure with radiation to her neck. Felt like she couldn't swallow and was sob. Took 2 SL NTG without relief so she presented to the ER. The pain was relieved by the time she arrived in the ER and lasted a total of . This was the same type of rest pain she has had in the past, but slightly more severe.   In the ED EKG revealed NSR 68bpm nonspecific T wave flattening V3-6. CXR is without acute cardiopulmonary abnormalities. Labs are significant for normal poc troponin, K+ 3.3, AST/ALT 40/40, otherwise unremarkable CBC/BMET. No further chest pain  since arrival.    Past Medical History  Diagnosis Date  . Hypertension   . Hypercholesteremia   . Syncope 2008    syncopal episode, probably related to excessive stress, activity, and fatigue      . 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 Shortness of breath   . Edema of lower extremity   . Fatigue   . Sinus problem   . History of cardiovascular stress test 09/09/2008    EF - 72%  /  normal nuclear study    2002 Cardiac Cath HEMODYNAMIC DATA: The aortic pressure was 129/68, LV is 130/16. There was no aortic valve gradient noted on pullback.  ANGIOGRAPHIC DATA:  1. Left main: Left main coronary artery is normal.  2. Left anterior descending: The left anterior descending is a moderate sized vessel that extends to and around the apex. Somewhat tortuous distally. There is a large diagonal vessel. It is otherwise normal.  3. Intermediate coronary artery is normal.  4. Left circumflex: Left circumflex is small but normal.  5. Right coronary artery: Right coronary artery is a large dominant vessel. It is normal.  LEFT VENTRICULOGRAPHY: Left ventricular angiogram was performed in the RAO position. Overall cardiac size and silhouette were normal. Left ventricular function was normal.  ABDOMINAL AORTOGRAM: Abdominal aortogram was performed using 30 cc of contrast at 20 cc/sec. This revealed what was felt to be a significant right renal  artery stenosis in the midportion of the vessel. There was a LAO hand injection taken and it was very suggestive that that was in fact the situation. It is hard to really define the exact severity of this lesion, but it dose appear to be quite focal in nature.  OVERALL IMPRESSION:  1. Normal left ventricular function.  2. Normal coronary arteries.  3. Probable right renal artery stenosis.  Surgical History:  Past Surgical History  Procedure Date  . Cardiac catheterization 2002    normal     Home Meds: Medication Sig  aspirin EC 81 MG  tablet Take 81 mg by mouth daily.  colesevelam (WELCHOL) 625 MG tablet Take 1,875 mg by mouth daily.  hydrochlorothiazide (HYDRODIURIL) 25 MG tablet Take 1 tablet (25 mg total) by mouth daily.  metoprolol succinate (TOPROL-XL) 25 MG 24 hr tablet Take 1 tablet (25 mg total) by mouth daily.  omega-3 acid ethyl esters (LOVAZA) 1 G capsule Take by mouth daily.  rosuvastatin (CRESTOR) 5 MG tablet Take 1 tablet (5 mg total) by mouth daily.  valsartan (DIOVAN) 80 MG tablet Take 20 mg by mouth daily.  nitroGLYCERIN (NITROSTAT) 0.4 MG SL tablet Place 1 tablet (0.4 mg total) under the tongue every 5 (five) minutes as needed for chest pain.    Allergies: No Known Allergies  History   Social History  . Marital Status: Married    Spouse Name: N/A    Number of Children: N/A  . Years of Education: N/A   Occupational History  . Not on file.   Social History Main Topics  . Smoking status: Never Smoker   . Smokeless tobacco: Never Used  . Alcohol Use: No  . Drug Use: No  . Sexually Active: Not on file   Other Topics Concern  . Not on file   Social History Narrative  . No narrative on file     Family History  Problem Relation Age of Onset  . Heart disease Father   . Heart attack Mother     questionable  . Lung cancer Mother   . Coronary artery disease      strong family history     Review of Systems: General: (+)  chills, fever; negative for night sweats or weight changes.  Cardiovascular: (+)  chest pain, shortness of breath, dyspnea on exertion; negative for edema, orthopnea, palpitations, paroxysmal nocturnal dyspnea  Dermatological: negative for rash Respiratory: negative for cough or wheezing Urologic: negative for hematuria Abdominal: (+)nausea, vomiting, diarrhea, abd pain; negative for  bright red blood per rectum, melena, or hematemesis Neurologic: negative for visual changes, syncope, or dizziness All other systems reviewed and are otherwise negative except as noted  above.  Labs:   Component Value Date   WBC 4.4 11/16/2011   HGB 13.2 11/16/2011   HCT 38.8 11/16/2011   MCV 86.8 11/16/2011   PLT 193 11/16/2011    Lab 11/16/11 1335  NA 141  K 3.3*  CL 103  CO2 29  BUN 11  CREATININE 0.87  CALCIUM 9.8  PROT 7.2  BILITOT 0.4  ALKPHOS 46  ALT 40*  AST 40*  GLUCOSE 91   No results found for this basename: CKTOTAL:4,CKMB:4,TROPONINI:4 in the last 72 hours  Radiology/Studies:   11/16/2011 - Chest 2 View Findings: Cardiomediastinal silhouette is unremarkable.  No acute infiltrate or pleural effusion.  No pulmonary edema.  Bony thorax is unremarkable.  IMPRESSION: No active disease.  No significant change.        EKG:  11/16/11 @ 1326 - NSR 68bpm T wave flattening V3-6  Physical Exam: Blood pressure 117/68, pulse 66, temperature 98.4 F (36.9 C), temperature source Oral, resp. rate 16, SpO2 94.00%. General: Well developed, white female in no acute distress. Head: Normocephalic, atraumatic, sclera non-icteric, nares are without discharge Neck: Supple. Negative for carotid bruits. JVD not elevated. Lungs: Clear bilaterally to auscultation without wheezes, rales, or rhonchi. Breathing is unlabored. Heart: RRR with S1 S2. No murmurs, rubs, or gallops appreciated. Abdomen: Soft, non-tender, non-distended with normoactive bowel sounds. No rebound/guarding. No obvious abdominal masses. Msk:  Strength and tone appear normal for age. Extremities: No edema. No clubbing or cyanosis. Distal pedal pulses are intact and equal bilaterally. Neuro: Alert and oriented X 3. Moves all extremities spontaneously. Psych:  Responds to questions appropriately with a normal affect.    ASSESSMENT AND PLAN:  61 y.o. female w/ PMHx significant for chronic microvascular angina (No CAD by cath 2002, nl myoview 2010), HTN, HLD, Syncope, and probable right renal artery stenosis by cath who presented to [facility] on 11/16/2011 with complaints of chest pain.  1. Chest  pain 2. Chronic microvascular angina with nl coronaries by cath 2002 and nl myoview 2010 3. Recent GI illness w/ n/v/d 4. Hypokalemia 5. Hypertension 6. Hyperlipidemia  Patient has history of chronic angina with normal coronaries by cath 2002 and nl myoview 2010. She had GI illness 4 days ago that she is now recovered from, but had sudden onset substernal chest pain at rest today that is identical to her chronic chest pain, but slightly more severe. It was eventually relieved with NTG after ~ 45 minutes. She is now chest pain free. Poc troponin is normal. She is afebrile without leukocytosis. CXR without acute findings. EKG does show nonspecific T wave flattening in V3-6, but no ST changes. Do not suspect ACS or myocarditis. She is not tachycardic, tachypneic, or hypoxic. No signs or symptoms of DVT. Low suspicion for PE. Will check one set of cardiac enzymes. If normal she can be discharged from a cardiac standpoint with follow up with Dr. Shirlee Latch (has appt already on 10/15). She was instructed to return to the ED if symptoms return and are not relieved with NTG. Supplement K+. Encourage po intake.   Signed, HOPE, JESSICA PA-C 11/16/2011, 4:05 PM  Patient seen and examined.  Agree with findings of J Hope as noted above. Patient with history of microvascular disease with angina.  Has intermittent CP relieved with NTG.  She is recovering for GI illness that was severe.  Today had episode of CP that was like her usual but to 2 NTG to resolve.   Currently asymptomatic.  On exam:  LUngs:  CTA; Cardiac:  RRR No S3.  No murmurs  Abd:  No signif tenderness.  Ext:  No edema EKG without acute changes.  Initial trop neg.  K was low at 3.3.  Overall I am not convinced of active angina.  Her symptms earlier today may have been a little more signif than usual because of recent illness (K low, may be a little dry).   I would recomm check I more trop  Replete K.  If lab ok D/C home.  Has  appt on Oct 14.in clinic  .  2.  HTN  Adequate control.  3.  HL  Continue statin.  Dietrich Pates 5:08 PM 11/16/2011

## 2011-11-16 NOTE — ED Notes (Signed)
I-stat troponin 0.00 

## 2011-11-16 NOTE — ED Notes (Signed)
Unable to talk to the doctor yet.  Now there is an admitting doctor at the bedside

## 2011-11-16 NOTE — ED Notes (Signed)
Pt alert talking to the med tech

## 2011-11-16 NOTE — ED Notes (Signed)
Updated pt of longest wait time in department.  Advised pt to notify this RN if any change in condition or needs. 

## 2011-11-16 NOTE — ED Notes (Signed)
Onset 4 days ago nausea emesis with fever and then diarrhea.  Today chest pain took nitro 2 nitro's with relief 0/10 chest pain however throat "closing feeling" Airway intact bilateral equal chest rise and fall.

## 2011-11-16 NOTE — ED Notes (Signed)
Pt up to the br no chest pain

## 2011-11-16 NOTE — ED Provider Notes (Signed)
History     CSN: [id number]  Arrival date & time 11/16/11  1321   First MD Initiated Contact with Patient 11/16/11 1508      Chief Complaint  Patient presents with  . Chest Pain    (Consider location/radiation/quality/duration/timing/severity/associated sxs/prior treatment) HPI Pt has history of microvascular coronary artery disease. She has episodic angina with exertion reliefed with rest and NTG. Today while at rest had substernal chest pressure radiating to neck around 1300 and lasting 45 min. Took NTG but was not as effective as normally is. Pt is currently pain free. Pain she had today is similar in character as previous anginal spells though longer and more intense. Last cath 2002.  Past Medical History  Diagnosis Date  . Hypertension   . Hypercholesteremia   . Syncope 2008    syncopal episode, probably related to excessive stress, activity, and fatigue      . 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 Shortness of breath   . Edema of lower extremity   . Fatigue   . Sinus problem   . History of cardiovascular stress test 09/09/2008    EF - 72%  /  normal nuclear study    Past Surgical History  Procedure Date  . Cardiac catheterization 2002    normal    Family History  Problem Relation Age of Onset  . Heart disease Father   . Heart attack Mother     questionable  . Lung cancer Mother   . Coronary artery disease      strong family history     History  Substance Use Topics  . Smoking status: Never Smoker   . Smokeless tobacco: Never Used  . Alcohol Use: No    OB History    Grav Para Term Preterm Abortions TAB SAB Ect Mult Living                  Review of Systems  Constitutional: Negative for fever and chills.  Respiratory: Positive for shortness of breath. Negative for cough.   Cardiovascular: Positive for chest pain. Negative for palpitations and leg swelling.  Gastrointestinal: Negative for nausea, vomiting, abdominal pain, diarrhea  and constipation.  Musculoskeletal: Negative for back pain.  Skin: Negative for pallor, rash and wound.  Neurological: Negative for dizziness, weakness, light-headedness, numbness and headaches.    Allergies  Review of patient's allergies indicates no known allergies.  Home Medications   Current Outpatient Rx  Name Route Sig Dispense Refill  . ASPIRIN EC 81 MG PO TBEC Oral Take 81 mg by mouth daily.    . COLESEVELAM HCL 625 MG PO TABS Oral Take 1,875 mg by mouth daily.    Marland Kitchen HYDROCHLOROTHIAZIDE 25 MG PO TABS Oral Take 1 tablet (25 mg total) by mouth daily. 30 tablet 2  . METOPROLOL SUCCINATE ER 25 MG PO TB24 Oral Take 1 tablet (25 mg total) by mouth daily. 90 tablet 2  . OMEGA-3-ACID ETHYL ESTERS 1 G PO CAPS Oral Take by mouth daily.    Marland Kitchen ROSUVASTATIN CALCIUM 5 MG PO TABS Oral Take 1 tablet (5 mg total) by mouth daily. 30 tablet 6  . VALSARTAN 80 MG PO TABS Oral Take 20 mg by mouth daily.    Marland Kitchen NITROGLYCERIN 0.4 MG SL SUBL Sublingual Place 1 tablet (0.4 mg total) under the tongue every 5 (five) minutes as needed for chest pain. 100 tablet 3    BP 145/66  Pulse 64  Temp 98.4  F (36.9 C) (Oral)  Resp 18  SpO2 100%  Physical Exam  Nursing note and vitals reviewed. Constitutional: She is oriented to person, place, and time. She appears well-developed and well-nourished. No distress.  HENT:  Head: Normocephalic and atraumatic.  Mouth/Throat: Oropharynx is clear and moist.  Eyes: EOM are normal. Pupils are equal, round, and reactive to light.  Neck: Normal range of motion. Neck supple.  Cardiovascular: Normal rate and regular rhythm.   Pulmonary/Chest: Effort normal and breath sounds normal. No respiratory distress. She has no wheezes. She has no rales.  Abdominal: Soft. Bowel sounds are normal. She exhibits no distension and no mass. There is no tenderness. There is no rebound and no guarding.  Musculoskeletal: Normal range of motion. She exhibits no edema and no tenderness.    Neurological: She is alert and oriented to person, place, and time.       No focal motor or sensory deficits  Skin: Skin is warm and dry. No rash noted. No erythema.  Psychiatric: She has a normal mood and affect. Her behavior is normal.    ED Course  Procedures (including critical care time)  Labs Reviewed  CBC WITH DIFFERENTIAL - Abnormal; Notable for the following:    Neutrophils Relative 40 (*)     All other components within normal limits  COMPREHENSIVE METABOLIC PANEL - Abnormal; Notable for the following:    Potassium 3.3 (*)     AST 40 (*)     ALT 40 (*)     GFR calc non Af Amer 71 (*)     GFR calc Af Amer 82 (*)     All other components within normal limits  POCT I-STAT TROPONIN I  CK TOTAL AND CKMB  TROPONIN I   Dg Chest 2 View  11/16/2011  *RADIOLOGY REPORT*  Clinical Data: Chest pain, shortness of breath  CHEST - 2 VIEW  Comparison: 09/06/2008  Findings: Cardiomediastinal silhouette is unremarkable.  No acute infiltrate or pleural effusion.  No pulmonary edema.  Bony thorax is unremarkable.  IMPRESSION: No active disease.  No significant change.   Original Report Authenticated By: Natasha Mead, M.D.      1. Angina pectoris syndrome   2. Hyperlipemia   3. Hypertension      Date: 11/16/2011  Rate: 68  Rhythm: normal sinus rhythm  QRS Axis: normal  Intervals: normal  ST/T Wave abnormalities: nonspecific T wave changes  Conduction Disutrbances:none  Narrative Interpretation:   Old EKG Reviewed: changes noted Question flattening of Twave in III compared to old EKG   MDM  Discussed with Trish from [facility] and will eval in ED.   Pt seen by cardiology and cleared to go home and f/u with cards as outpt. Pt is comfortable with plan.       Loren Racer, MD 11/16/11 1725

## 2011-12-05 ENCOUNTER — Encounter: Payer: Self-pay | Admitting: Cardiology

## 2011-12-05 ENCOUNTER — Ambulatory Visit (INDEPENDENT_AMBULATORY_CARE_PROVIDER_SITE_OTHER): Payer: PRIVATE HEALTH INSURANCE | Admitting: Cardiology

## 2011-12-05 VITALS — BP 132/70 | HR 70 | Ht 64.0 in | Wt 175.0 lb

## 2011-12-05 DIAGNOSIS — R079 Chest pain, unspecified: Secondary | ICD-10-CM

## 2011-12-05 DIAGNOSIS — E785 Hyperlipidemia, unspecified: Secondary | ICD-10-CM

## 2011-12-05 DIAGNOSIS — I209 Angina pectoris, unspecified: Secondary | ICD-10-CM

## 2011-12-05 LAB — HEPATIC FUNCTION PANEL
Albumin: 3.7 g/dL (ref 3.5–5.2)
Total Protein: 7.1 g/dL (ref 6.0–8.3)

## 2011-12-05 LAB — BASIC METABOLIC PANEL
Chloride: 102 mEq/L (ref 96–112)
GFR: 78.72 mL/min (ref >60.00)
Potassium: 3.2 mEq/L — ABNORMAL LOW (ref 3.5–5.1)

## 2011-12-05 NOTE — Patient Instructions (Addendum)
Your physician recommends that you have  lab work today--BMET/liver profile /TSH.  Your physician wants you to follow-up in: 6 months with Dr Shirlee Latch. ( April 2014). You will receive a reminder letter in the mail two months in advance. If you don't receive a letter, please call our office to schedule the follow-up appointment.

## 2011-12-05 NOTE — Progress Notes (Signed)
Patient ID: [name], female   DOB: [birth year], 61 y.o.   MRN: [id number] PCP: Dr. Cyndia Bent  61 yo with history of microvascular angina and hyperlipidemia presents for cardiology followup. She has had chest pain for over 10 years.  She will get tightness in her chest sometimes radiating to the left arm as well as dyspnea when walking up hills or if she carries a heavy load up steps.  This tends to resolve if she takes nitroglycerin.  She walks 30-40 minutes a day for exercise with her husband and had no chest pain with this.  She rarely (1-2 times a year) gets a severe episode of chest pain at rest.   Since last appointment, she had an episode of severe chest tightness while riding in the car with her husband.  This was on 9/27.  He took her to the ER.  She was observed for several hours with negative serial cardiac enzymes.  She had also had a GI illness at the same time with profuse diarrhea.  She has had no severe chest pain episodes since that time.  She is tolerating Crestor 5 mg daily with only mild muscle aches.   Labs (5/12): K 3.6, creatinine 0.9, TGs 178, HDL 57, LDL 160 Labs (2/13): HDL 58, LDL 86, TGs 203 Labs (8/13): HDL 59, LDL 102, LFTs normal Labs (9/13): AST 40, ALT 40, K 3.3, creatinine 0.87  PMH: 1. Microvascular angina: Patient had left heart cath in 3/02 with normal coronaries.  She had a stress cardiolite in 7/10 that was normal.  Unable to tolerate Imdur. 2. Right renal artery stenosis noted on 3/12 cath 3. HTN 4. Vasovagal symptoms 5. Hyperlipidemia: Myalgias with several statins, tolerating low dose Crestor.   SH: Married, lives with husband [place].  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  3 daughters.  Nonsmoker, no ETOH.   FH: Mother died at 60 with lung cancer, she thinks mother had an MI in her 30s.  Father had an MI in his 40s.    Current Outpatient Prescriptions  Medication Sig Dispense Refill  . aspirin EC 81 MG tablet Take 81 mg by  mouth daily.      . colesevelam (WELCHOL) 625 MG tablet Take 1,875 mg by mouth daily.      . hydrochlorothiazide (HYDRODIURIL) 25 MG tablet Take 1 tablet (25 mg total) by mouth daily.  30 tablet  2  . metoprolol succinate (TOPROL-XL) 25 MG 24 hr tablet Take 1 tablet (25 mg total) by mouth daily.  90 tablet  2  . nitroGLYCERIN (NITROSTAT) 0.4 MG SL tablet Place 0.4 mg under the tongue every 5 (five) minutes as needed.      Marland Kitchen omega-3 acid ethyl esters (LOVAZA) 1 G capsule Take by mouth daily.      . rosuvastatin (CRESTOR) 5 MG tablet Take 1 tablet (5 mg total) by mouth daily.  30 tablet  6  . valsartan (DIOVAN) 80 MG tablet Take 20 mg by mouth daily.      Marland Kitchen DISCONTD: nitroGLYCERIN (NITROSTAT) 0.4 MG SL tablet Place 1 tablet (0.4 mg total) under the tongue every 5 (five) minutes as needed for chest pain.  100 tablet  3    BP 132/70  Pulse 70  Ht 5\' 4"  (1.626 m)  Wt 175 lb (79.379 kg)  BMI 30.04 kg/m2 General: NAD Neck: No JVD, no thyromegaly or thyroid nodule.  Lungs: Clear to auscultation bilaterally with normal respiratory effort.  CV: Nondisplaced PMI.  Heart regular S1/S2, no S3/S4, no murmur.  No peripheral edema.  No carotid bruit.  Normal pedal pulses.  Abdomen: Soft, nontender, no hepatosplenomegaly, no distention.  Neurologic: Alert and oriented x 3.  Psych: Normal affect. Extremities: No clubbing or cyanosis.   Assessment/Plan  Angina pectoris syndrome  Patient has chronic chest pain with moderate to heavy exertion as well as rare episodes at rest. She will have severe episodes several times a year (last on 9/27). This pattern has been present for over 10 years. She is currently at her baseline. She had a normal cath in 3/02 and a normal cardiolite in 2010. She is thought to have microvascular angina. It is responsive to nitrates. She is on a beta blocker, ARB, and low dose statin. She was unable to tolerate Imdur. Ranolazine may be an option in the future. She should continue ASA  81 mg daily.  No changes today. Hyperlipemia  I would ideally like to see LDL < 70 (known vascular disease with renal artery stenosis), but she has not been able to tolerate a higher dose of Crestor.  Elevated LFTs LFTs were elevated in 9/13 in the setting of diarrheal illness.  I will repeat LFTs today to make sure they are back to normal, especially given statin use.   [name] 12/05/2011

## 2011-12-06 ENCOUNTER — Other Ambulatory Visit: Payer: Self-pay | Admitting: Cardiology

## 2011-12-06 ENCOUNTER — Other Ambulatory Visit: Payer: Self-pay | Admitting: *Deleted

## 2011-12-06 DIAGNOSIS — E876 Hypokalemia: Secondary | ICD-10-CM

## 2011-12-06 MED ORDER — POTASSIUM CHLORIDE CRYS ER 20 MEQ PO TBCR: 20.0000 meq | EXTENDED_RELEASE_TABLET | Freq: Every day | ORAL | Status: DC

## 2011-12-21 ENCOUNTER — Other Ambulatory Visit (INDEPENDENT_AMBULATORY_CARE_PROVIDER_SITE_OTHER): Payer: PRIVATE HEALTH INSURANCE

## 2011-12-21 DIAGNOSIS — E876 Hypokalemia: Secondary | ICD-10-CM

## 2011-12-21 LAB — BASIC METABOLIC PANEL
CO2: 29 mEq/L (ref 19–32)
GFR: 75.39 mL/min (ref >60.00)
Glucose, Bld: 84 mg/dL (ref 70–99)
Potassium: 3.8 mEq/L (ref 3.5–5.1)
Sodium: 140 mEq/L (ref 135–145)

## 2012-02-13 ENCOUNTER — Other Ambulatory Visit: Payer: Self-pay | Admitting: Cardiology

## 2012-04-16 ENCOUNTER — Other Ambulatory Visit: Payer: Self-pay | Admitting: *Deleted

## 2012-04-16 MED ORDER — METOPROLOL SUCCINATE ER 25 MG PO TB24: 25.0000 mg | ORAL_TABLET | Freq: Every day | ORAL | Status: DC

## 2012-04-19 ENCOUNTER — Other Ambulatory Visit: Payer: Self-pay | Admitting: Cardiology

## 2012-05-14 ENCOUNTER — Other Ambulatory Visit: Payer: Self-pay | Admitting: Cardiology

## 2012-05-29 ENCOUNTER — Other Ambulatory Visit: Payer: Self-pay | Admitting: Nurse Practitioner

## 2012-05-29 ENCOUNTER — Ambulatory Visit (INDEPENDENT_AMBULATORY_CARE_PROVIDER_SITE_OTHER): Payer: PRIVATE HEALTH INSURANCE | Admitting: Cardiology

## 2012-05-29 ENCOUNTER — Encounter: Payer: Self-pay | Admitting: Cardiology

## 2012-05-29 VITALS — BP 145/76 | HR 65 | Ht 64.0 in | Wt 184.0 lb

## 2012-05-29 DIAGNOSIS — I10 Essential (primary) hypertension: Secondary | ICD-10-CM

## 2012-05-29 DIAGNOSIS — I209 Angina pectoris, unspecified: Secondary | ICD-10-CM

## 2012-05-29 DIAGNOSIS — E785 Hyperlipidemia, unspecified: Secondary | ICD-10-CM

## 2012-05-29 DIAGNOSIS — R079 Chest pain, unspecified: Secondary | ICD-10-CM

## 2012-05-29 LAB — LIPID PANEL
HDL: 54.1 mg/dL (ref >39.00)
Triglycerides: 142 mg/dL (ref 0.0–149.0)
VLDL: 28.4 mg/dL (ref 0.0–40.0)

## 2012-05-29 LAB — BASIC METABOLIC PANEL
GFR: 74.24 mL/min (ref >60.00)
Glucose, Bld: 90 mg/dL (ref 70–99)
Potassium: 3.4 mEq/L — ABNORMAL LOW (ref 3.5–5.1)
Sodium: 140 mEq/L (ref 135–145)

## 2012-05-29 LAB — HEPATIC FUNCTION PANEL
ALT: 41 U/L — ABNORMAL HIGH (ref 0–35)
Albumin: 4 g/dL (ref 3.5–5.2)
Total Bilirubin: 0.8 mg/dL (ref 0.3–1.2)

## 2012-05-29 MED ORDER — METOPROLOL SUCCINATE ER 50 MG PO TB24: 50.0000 mg | ORAL_TABLET | Freq: Every day | ORAL | Status: DC

## 2012-05-29 NOTE — Patient Instructions (Addendum)
Your physician recommends that you have lab work today--lipid profile/liver profile/BMET.   Your physician has requested that you have en exercise stress myoview. For further information please visit [email or web address] Please follow instruction sheet, as given.   Increase Toprol XL(metoprolol succinate) to 50mg  daily. You can take 2 of your 25mg  tablets daily at the same time and use your current supply.   Your physician wants you to follow-up in: 4 months with Dr Shirlee Latch. (August 2014).You will receive a reminder letter in the mail two months in advance. If you don't receive a letter, please call our office to schedule the follow-up appointment.

## 2012-05-29 NOTE — Progress Notes (Signed)
Patient ID: [name], female   DOB: 04/11/1950, 62 y.o.   MRN: [id number] PCP: Dr. Cyndia Bent  62 yo with history of microvascular angina and hyperlipidemia presents for cardiology followup. She has had chest pain for over 10 years.  She has had a pattern of tightness in her chest sometimes radiating to the left arm as well as dyspnea when walking up hills or if she carries a heavy load up steps.  This tends to resolve if she takes nitroglycerin.    Since last appointment, she has not been as active because husband has had some health problems.  She recently has tried to start exercising again.  She is short of breath walking up hills or stairs (did not notice this before).  She also is getting more exertional chest tightness than in the past.  She is tolerating Crestor 5 mg daily with only mild muscle aches.   Labs (5/12): K 3.6, creatinine 0.9, TGs 178, HDL 57, LDL 160 Labs (2/13): HDL 58, LDL 86, TGs 203 Labs (8/13): HDL 59, LDL 102, LFTs normal Labs (9/13): AST 40, ALT 40, K 3.3, creatinine 0.87 Labs (10/13): K 3.8, creatinine 0.8, LFTs normal  ECG: NSR, T wave inversion in III  PMH: 1. Microvascular angina: Patient had left heart cath in 3/02 with normal coronaries.  She had a stress cardiolite in 7/10 that was normal.  Unable to tolerate Imdur. 2. Right renal artery stenosis noted on 3/12 cath 3. HTN 4. Vasovagal symptoms 5. Hyperlipidemia: Myalgias with several statins, tolerating low dose Crestor.   SH: Married, lives with husband [place].  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  3 daughters.  Nonsmoker, no ETOH.   FH: Mother died at 6 with lung cancer, she thinks mother had an MI in her 30s.  Father had an MI in his 7s.   ROS: All systems reviewed and negative except as per HPI.    Current Outpatient Prescriptions  Medication Sig Dispense Refill  . aspirin EC 81 MG tablet Take 81 mg by mouth daily.      . colesevelam (WELCHOL) 625 MG tablet Take  1,875 mg by mouth daily.      . hydrochlorothiazide (HYDRODIURIL) 25 MG tablet TAKE 1 TABLET BY MOUTH DAILY.  30 tablet  6  . LOVAZA 1 G capsule TAKE 2 CAPSULES BY MOUTH ONCE DAILY  60 capsule  PRN  . nitroGLYCERIN (NITROSTAT) 0.4 MG SL tablet Place 0.4 mg under the tongue every 5 (five) minutes as needed.      . rosuvastatin (CRESTOR) 5 MG tablet Take 1 tablet (5 mg total) by mouth daily.  30 tablet  6  . valsartan (DIOVAN) 80 MG tablet Take 20 mg by mouth daily.      . metoprolol succinate (TOPROL-XL) 50 MG 24 hr tablet Take 1 tablet (50 mg total) by mouth daily. Take with or immediately following a meal.  90 tablet  3   No current facility-administered medications for this visit.    BP 145/76  Pulse 65  Ht 5\' 4"  (1.626 m)  Wt 184 lb (83.462 kg)  BMI 31.57 kg/m2 General: NAD Neck: No JVD, no thyromegaly or thyroid nodule.  Lungs: Clear to auscultation bilaterally with normal respiratory effort. CV: Nondisplaced PMI.  Heart regular S1/S2, no S3/S4, no murmur.  No peripheral edema.  No carotid bruit.  Normal pedal pulses.  Abdomen: Soft, nontender, no hepatosplenomegaly, no distention.  Neurologic: Alert and oriented x 3.  Psych: Normal affect. Extremities: No clubbing or cyanosis.   Assessment/Plan  Angina pectoris syndrome  Patient has had chronic chest pain with moderate to heavy exertion as well as rare episodes at rest. This pattern has been present for over 10 years. She is currently at her baseline. She had a normal cath in 3/02 and a normal cardiolite in 2010. She is thought to have microvascular angina. It is responsive to nitrates. She is on a beta blocker, ARB, and low dose statin. She was unable to tolerate Imdur. She should continue ASA 81 mg daily.  Her chest pain symptoms have worsened lately after a period of decreased exercise.  I think that we probably ought to get a Lexiscan Sestamibi given the worsening of exertional symptoms.  A normal Sestamibi would be reassuring.   I will also increase Toprol XL to 50 mg daily to see if this helps with exertional chest pain.  Hyperlipemia  I will check lipids/LFTs today.  Continue Crestor.   Marca Ancona 05/29/2012

## 2012-05-30 ENCOUNTER — Other Ambulatory Visit: Payer: Self-pay | Admitting: *Deleted

## 2012-05-30 DIAGNOSIS — E785 Hyperlipidemia, unspecified: Secondary | ICD-10-CM

## 2012-05-30 DIAGNOSIS — E876 Hypokalemia: Secondary | ICD-10-CM

## 2012-05-30 MED ORDER — ROSUVASTATIN CALCIUM 10 MG PO TABS: 10.0000 mg | ORAL_TABLET | Freq: Every day | ORAL | Status: DC

## 2012-05-30 MED ORDER — POTASSIUM CHLORIDE CRYS ER 20 MEQ PO TBCR: 20.0000 meq | EXTENDED_RELEASE_TABLET | Freq: Every day | ORAL | Status: DC

## 2012-06-03 ENCOUNTER — Other Ambulatory Visit: Payer: Self-pay | Admitting: *Deleted

## 2012-06-03 MED ORDER — COLESEVELAM HCL 625 MG PO TABS: 1875.0000 mg | ORAL_TABLET | Freq: Every day | ORAL | Status: DC

## 2012-06-05 ENCOUNTER — Other Ambulatory Visit: Payer: Self-pay | Admitting: *Deleted

## 2012-06-05 MED ORDER — COLESEVELAM HCL 625 MG PO TABS: 1875.0000 mg | ORAL_TABLET | Freq: Every day | ORAL | Status: DC

## 2012-06-11 ENCOUNTER — Ambulatory Visit (HOSPITAL_COMMUNITY): Payer: PRIVATE HEALTH INSURANCE | Attending: Cardiology | Admitting: Radiology

## 2012-06-11 VITALS — BP 142/84 | HR 66 | Ht 64.0 in | Wt 181.0 lb

## 2012-06-11 DIAGNOSIS — R0989 Other specified symptoms and signs involving the circulatory and respiratory systems: Secondary | ICD-10-CM | POA: Insufficient documentation

## 2012-06-11 DIAGNOSIS — R0789 Other chest pain: Secondary | ICD-10-CM | POA: Insufficient documentation

## 2012-06-11 DIAGNOSIS — R0602 Shortness of breath: Secondary | ICD-10-CM

## 2012-06-11 DIAGNOSIS — I10 Essential (primary) hypertension: Secondary | ICD-10-CM | POA: Insufficient documentation

## 2012-06-11 DIAGNOSIS — R0609 Other forms of dyspnea: Secondary | ICD-10-CM | POA: Insufficient documentation

## 2012-06-11 DIAGNOSIS — R61 Generalized hyperhidrosis: Secondary | ICD-10-CM | POA: Insufficient documentation

## 2012-06-11 DIAGNOSIS — R5381 Other malaise: Secondary | ICD-10-CM | POA: Insufficient documentation

## 2012-06-11 DIAGNOSIS — R5383 Other fatigue: Secondary | ICD-10-CM | POA: Insufficient documentation

## 2012-06-11 DIAGNOSIS — I471 Supraventricular tachycardia, unspecified: Secondary | ICD-10-CM

## 2012-06-11 DIAGNOSIS — R079 Chest pain, unspecified: Secondary | ICD-10-CM

## 2012-06-11 DIAGNOSIS — I4949 Other premature depolarization: Secondary | ICD-10-CM

## 2012-06-11 DIAGNOSIS — Z8249 Family history of ischemic heart disease and other diseases of the circulatory system: Secondary | ICD-10-CM | POA: Insufficient documentation

## 2012-06-11 DIAGNOSIS — E785 Hyperlipidemia, unspecified: Secondary | ICD-10-CM | POA: Insufficient documentation

## 2012-06-11 MED ORDER — TECHNETIUM TC 99M SESTAMIBI GENERIC - CARDIOLITE
10.0000 | Freq: Once | INTRAVENOUS | Status: AC | PRN
  Administered 2012-06-11: 10 via INTRAVENOUS

## 2012-06-11 MED ORDER — TECHNETIUM TC 99M SESTAMIBI GENERIC - CARDIOLITE
30.0000 | Freq: Once | INTRAVENOUS | Status: AC | PRN
  Administered 2012-06-11: 30 via INTRAVENOUS

## 2012-06-11 NOTE — Progress Notes (Addendum)
[facility] SITE 3 NUCLEAR MED [address] [place], [place] [address] [phone]    Cardiology Nuclear Med Study  [name] is a 62 y.o. female     MRN : [id number]     DOB: [birth year]  Procedure Date: 06/11/2012  Nuclear Med Background Indication for Stress Test:  Evaluation for Ischemia History:  '02 Cath:normal coronaries; '10 OZH:YQMVHQ; h/o Renal Artery Stenosis Cardiac Risk Factors: Family History - CAD, Hypertension and Lipids  Symptoms:  Chest Pain/Tightness with and without Exertion>(L) Arm/Jaw (last episode of chest discomfort was Sunday), Diaphoresis, DOE, Fatigue and Rapid HR   Nuclear Pre-Procedure Caffeine/Decaff Intake:  None NPO After: 8:30pm   Lungs:  Clear. O2 Sat: 97% on room air. IV 0.9% NS with Angio Cath:  22g  IV Site: R Antecubital  IV Started by:  Milana Na, EMT-P  Chest Size (in):  40 Cup Size: D  Height: 5\' 4"  (1.626 m)  Weight:  181 lb (82.101 kg)  BMI:  Body mass index is 31.05 kg/(m^2). Tech Comments:  No Rx this am    Nuclear Med Study 1 or 2 day study: 1 day  Stress Test Type:  Stress  Reading MD: Kristeen Miss, MD  Order Authorizing Provider:  Marca Ancona, MD  Resting Radionuclide: Technetium 54m Sestamibi  Resting Radionuclide Dose: 11.0 mCi   Stress Radionuclide:  Technetium 53m Sestamibi  Stress Radionuclide Dose: 33.0 mCi           Stress Protocol Rest HR: 66 Stress HR: 142  Rest BP: 142/84 Stress BP: 203/75  Exercise Time (min): 4:46 METS: 6.7   Predicted Max HR: 159 bpm % Max HR: 89.31 bpm Rate Pressure Product: 46962   Dose of Adenosine (mg):  n/a Dose of Lexiscan: n/a mg  Dose of Atropine (mg): n/a Dose of Dobutamine: n/a mcg/kg/min (at max HR)  Stress Test Technologist: Smiley Houseman, CMA-N  Nuclear Technologist:  Domenic Polite, CNMT     Rest Procedure:  Myocardial perfusion imaging was performed at rest 4 hours post stress injection by intravenous administration of Thallous Chloride TI201,  wait 20-30 minutes, and image.   Rest ECG: NSR - Normal EKG  Stress Procedure:  The patient exercised on the treadmill utilizing the Bruce Protocol for 4:46 minutes. The patient stopped due to chest tightness.  Technetium 66m Sestamibi was injected at peak exercise and myocardial perfusion imaging was performed after a brief delay.  Stress ECG: No significant change from baseline ECG  QPS Raw Data Images:  Normal; no motion artifact; normal heart/lung ratio. Stress Images:  There is mild apical thinning with normal uptake in other regions. Rest Images:  There is mild apical thinning with normal uptake in other regions. Subtraction (SDS):  No evidence of ischemia. Transient Ischemic Dilatation (Normal <1.22):  0.89 Lung/Heart Ratio (Normal <0.45):  0.38  Quantitative Gated Spect Images QGS EDV:  63 ml QGS ESV:  13 ml  Impression Exercise Capacity:  Poor exercise capacity. BP Response:  Normal blood pressure response. Clinical Symptoms:  No significant symptoms noted. ECG Impression:  No significant ST segment change suggestive of ischemia. Comparison with Prior Nuclear Study: No significant change from previous study  Overall Impression:  Low risk stress nuclear study.  There is apical thinning .  This is likely an artifact.  The LV contracts normally.  The apex contracts especially well.   LV Ejection Fraction: 80%.  LV Wall Motion:  NL LV Function; NL Wall Motion    Low risk,  probably normal.  Please inform patient.   Marca Ancona 06/13/2012

## 2012-06-12 ENCOUNTER — Other Ambulatory Visit: Payer: Self-pay | Admitting: Cardiology

## 2012-06-12 NOTE — Telephone Encounter (Signed)
Patient Instructions    Your physician recommends that you have lab work today--lipid profile/liver profile/BMET.  Your physician has requested that you have en exercise stress myoview. For further information please visit [email or web address] Please follow instruction sheet, as given.  Increase Toprol XL(metoprolol succinate) to 50mg  daily. You can take 2 of your 25mg  tablets daily at the same time and use your current supply.  Your physician wants you to follow-up in: 4 months with Dr Shirlee Latch. (August 2014).You will receive a reminder letter in the mail two months in advance. If you don't receive a letter, please call our office to schedule the follow-up appointment.      Provider Department Encounter #   05/29/2012 7:34 AM Marca Ancona, MD [facility] [id number]

## 2012-06-13 NOTE — Progress Notes (Signed)
Pt.notified

## 2012-06-22 ENCOUNTER — Emergency Department (HOSPITAL_COMMUNITY): Payer: PRIVATE HEALTH INSURANCE

## 2012-06-22 ENCOUNTER — Encounter (HOSPITAL_COMMUNITY): Payer: Self-pay | Admitting: *Deleted

## 2012-06-22 DIAGNOSIS — E7800 Pure hypercholesterolemia, unspecified: Secondary | ICD-10-CM | POA: Insufficient documentation

## 2012-06-22 DIAGNOSIS — R799 Abnormal finding of blood chemistry, unspecified: Secondary | ICD-10-CM | POA: Insufficient documentation

## 2012-06-22 DIAGNOSIS — R0602 Shortness of breath: Secondary | ICD-10-CM | POA: Insufficient documentation

## 2012-06-22 DIAGNOSIS — R079 Chest pain, unspecified: Principal | ICD-10-CM | POA: Insufficient documentation

## 2012-06-22 DIAGNOSIS — I209 Angina pectoris, unspecified: Secondary | ICD-10-CM | POA: Insufficient documentation

## 2012-06-22 DIAGNOSIS — I10 Essential (primary) hypertension: Secondary | ICD-10-CM | POA: Insufficient documentation

## 2012-06-22 LAB — POCT I-STAT TROPONIN I: Troponin i, poc: 0 ng/mL (ref 0.00–0.08)

## 2012-06-22 LAB — CBC
MCHC: 35.4 g/dL (ref 30.0–36.0)
MCV: 84.2 fL (ref 78.0–100.0)
Platelets: 211 10*3/uL (ref 150–400)
RDW: 13 % (ref 11.5–15.5)
WBC: 6.8 10*3/uL (ref 4.0–10.5)

## 2012-06-22 LAB — BASIC METABOLIC PANEL
Chloride: 102 mEq/L (ref 96–112)
Creatinine, Ser: 0.84 mg/dL (ref 0.50–1.10)
GFR calc Af Amer: 85 mL/min — ABNORMAL LOW (ref >90)
GFR calc non Af Amer: 74 mL/min — ABNORMAL LOW (ref >90)
Potassium: 3.7 mEq/L (ref 3.5–5.1)

## 2012-06-22 NOTE — ED Notes (Addendum)
Chest pains that started at 2120; took x3  ntg. Sl. 0.4 mg 5 minutes apart with no relief; substernal cp. Had a choking sensation at same time. Hx. Of angina. Felt bad last x 2 days. Cardiologists double crestor and klorkon. Stress test x 2 weeks ago and normal - had cp during test.

## 2012-06-22 NOTE — ED Notes (Signed)
Old and new EKG given to Dr Rivka Spring

## 2012-06-23 ENCOUNTER — Encounter (HOSPITAL_COMMUNITY): Payer: Self-pay | Admitting: *Deleted

## 2012-06-23 ENCOUNTER — Observation Stay (HOSPITAL_COMMUNITY)
Admission: EM | Admit: 2012-06-23 | Discharge: 2012-06-23 | Disposition: A | Discharge status: Home / Self Care | Payer: PRIVATE HEALTH INSURANCE | Attending: Family Medicine | Admitting: Family Medicine

## 2012-06-23 DIAGNOSIS — E785 Hyperlipidemia, unspecified: Secondary | ICD-10-CM | POA: Diagnosis present

## 2012-06-23 DIAGNOSIS — I10 Essential (primary) hypertension: Secondary | ICD-10-CM | POA: Diagnosis present

## 2012-06-23 DIAGNOSIS — E782 Mixed hyperlipidemia: Secondary | ICD-10-CM | POA: Diagnosis present

## 2012-06-23 DIAGNOSIS — R079 Chest pain, unspecified: Secondary | ICD-10-CM

## 2012-06-23 DIAGNOSIS — I209 Angina pectoris, unspecified: Secondary | ICD-10-CM | POA: Diagnosis present

## 2012-06-23 LAB — CBC
HCT: 33.6 % — ABNORMAL LOW (ref 36.0–46.0)
MCHC: 35.1 g/dL (ref 30.0–36.0)
MCV: 83.4 fL (ref 78.0–100.0)
RDW: 13 % (ref 11.5–15.5)

## 2012-06-23 LAB — BASIC METABOLIC PANEL
BUN: 13 mg/dL (ref 6–23)
CO2: 30 mEq/L (ref 19–32)
Chloride: 103 mEq/L (ref 96–112)
Creatinine, Ser: 0.78 mg/dL (ref 0.50–1.10)

## 2012-06-23 MED ORDER — ISOSORBIDE MONONITRATE ER 30 MG PO TB24: 30.0000 mg | ORAL_TABLET | Freq: Every evening | ORAL | Status: DC | PRN

## 2012-06-23 MED ORDER — ASPIRIN EC 325 MG PO TBEC
325.0000 mg | DELAYED_RELEASE_TABLET | Freq: Every day | ORAL | Status: DC
  Administered 2012-06-23: 325 mg via ORAL
  Filled 2012-06-23: qty 1

## 2012-06-23 MED ORDER — ISOSORBIDE MONONITRATE ER 30 MG PO TB24
30.0000 mg | ORAL_TABLET | Freq: Every day | ORAL | Status: DC
  Administered 2012-06-23: 30 mg via ORAL
  Filled 2012-06-23: qty 1

## 2012-06-23 MED ORDER — NITROGLYCERIN 2 % TD OINT
0.5000 [in_us] | TOPICAL_OINTMENT | Freq: Four times a day (QID) | TRANSDERMAL | Status: DC
  Filled 2012-06-23: qty 30

## 2012-06-23 MED ORDER — SODIUM CHLORIDE 0.9 % IV SOLN: INTRAVENOUS | Status: DC

## 2012-06-23 MED ORDER — ALUM & MAG HYDROXIDE-SIMETH 200-200-20 MG/5ML PO SUSP: 30.0000 mL | Freq: Four times a day (QID) | ORAL | Status: DC | PRN

## 2012-06-23 MED ORDER — HYDROMORPHONE HCL PF 1 MG/ML IJ SOLN: 0.5000 mg | INTRAMUSCULAR | Status: DC | PRN

## 2012-06-23 MED ORDER — ACETAMINOPHEN 650 MG RE SUPP: 650.0000 mg | Freq: Four times a day (QID) | RECTAL | Status: DC | PRN

## 2012-06-23 MED ORDER — ENOXAPARIN SODIUM 40 MG/0.4ML ~~LOC~~ SOLN
40.0000 mg | Freq: Every day | SUBCUTANEOUS | Status: DC
  Administered 2012-06-23: 40 mg via SUBCUTANEOUS
  Filled 2012-06-23: qty 0.4

## 2012-06-23 MED ORDER — ONDANSETRON HCL 4 MG/2ML IJ SOLN: 4.0000 mg | Freq: Four times a day (QID) | INTRAMUSCULAR | Status: DC | PRN

## 2012-06-23 MED ORDER — ACETAMINOPHEN 325 MG PO TABS
650.0000 mg | ORAL_TABLET | Freq: Four times a day (QID) | ORAL | Status: DC | PRN
  Administered 2012-06-23: 650 mg via ORAL
  Filled 2012-06-23: qty 2

## 2012-06-23 MED ORDER — ALBUTEROL SULFATE (5 MG/ML) 0.5% IN NEBU: 2.5000 mg | INHALATION_SOLUTION | RESPIRATORY_TRACT | Status: AC | PRN

## 2012-06-23 MED ORDER — ACETAMINOPHEN 325 MG PO TABS
650.0000 mg | ORAL_TABLET | Freq: Once | ORAL | Status: AC
Start: 2012-06-23 — End: 2012-06-23
  Administered 2012-06-23: 650 mg via ORAL
  Filled 2012-06-23: qty 2

## 2012-06-23 MED ORDER — ZOLPIDEM TARTRATE 5 MG PO TABS: 5.0000 mg | ORAL_TABLET | Freq: Every evening | ORAL | Status: DC | PRN

## 2012-06-23 MED ORDER — ONDANSETRON HCL 4 MG/2ML IJ SOLN: 4.0000 mg | Freq: Three times a day (TID) | INTRAMUSCULAR | Status: DC | PRN

## 2012-06-23 MED ORDER — OXYCODONE HCL 5 MG PO TABS
5.0000 mg | ORAL_TABLET | ORAL | Status: DC | PRN
  Administered 2012-06-23: 5 mg via ORAL
  Filled 2012-06-23: qty 1

## 2012-06-23 MED ORDER — ONDANSETRON HCL 4 MG PO TABS: 4.0000 mg | ORAL_TABLET | Freq: Four times a day (QID) | ORAL | Status: DC | PRN

## 2012-06-23 NOTE — ED Provider Notes (Signed)
History     CSN: [id number]  Arrival date & time 06/22/12  2228   First MD Initiated Contact with Patient 06/23/12 0016      Chief Complaint  Patient presents with  . Chest Pain   HPI [name] is a 62 y.o. female the past medical history significant for hypertension, hypercholesterolemia and microvascular angina. Patient sees Dr. Shirlee Latch for her cardiology, recently had a stress test on 06/11/2012 which was negative. Patient had chest pain this evening it was different than usual, it started while she was driving her car, it did not respond to nitroglycerin x3, it was associated with nausea, no radiation, no diaphoresis, some shortness of breath, no vomiting, no abdominal pain, no headaches.  Patient says she had some vertigo this evening as well. She's had vertigo in the past. Patient says she was recently started on Klor-Con again, she says this has made her feel like choking in the past and she feels like she is having that same sensation this evening. Patient says she had diarrhea 2 days ago denies any bloody or black stools.   Past Medical History  Diagnosis Date  . Hypertension   . Hypercholesteremia   . Syncope 2008    syncopal episode, probably related to excessive stress, activity, and fatigue      . 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 Shortness of breath   . Edema of lower extremity   . Fatigue   . Sinus problem   . History of cardiovascular stress test 09/09/2008    EF - 72%  /  normal nuclear study    Past Surgical History  Procedure Laterality Date  . Cardiac catheterization  2002    normal  . Cardiac catheterization      Family History  Problem Relation Age of Onset  . Heart disease Father   . Heart attack Mother     questionable  . Lung cancer Mother   . Coronary artery disease      strong family history     History  Substance Use Topics  . Smoking status: Never Smoker   . Smokeless tobacco: Never Used  . Alcohol Use: No     OB History   Grav Para Term Preterm Abortions TAB SAB Ect Mult Living                  Review of Systems At least 10pt or greater review of systems completed and are negative except where specified in the HPI.  Allergies  Review of patient's allergies indicates no known allergies.  Home Medications   Current Outpatient Rx  Name  Route  Sig  Dispense  Refill  . aspirin EC 81 MG tablet   Oral   Take 81 mg by mouth daily.         . colesevelam (WELCHOL) 625 MG tablet   Oral   Take 3 tablets (1,875 mg total) by mouth daily.   90 tablet   1   . DIOVAN 80 MG tablet      TAKE 1/4 TABLET BY MOUTH ONCE DAILY   30 tablet   1   . hydrochlorothiazide (HYDRODIURIL) 25 MG tablet      TAKE 1 TABLET BY MOUTH DAILY.   30 tablet   6   . LOVAZA 1 G capsule      TAKE 2 CAPSULES BY MOUTH ONCE DAILY   60 capsule   PRN   . metoprolol succinate (  TOPROL-XL) 50 MG 24 hr tablet   Oral   Take 1 tablet (50 mg total) by mouth daily. Take with or immediately following a meal.   90 tablet   3   . nitroGLYCERIN (NITROSTAT) 0.4 MG SL tablet   Sublingual   Place 0.4 mg under the tongue every 5 (five) minutes as needed.         . potassium chloride SA (K-DUR,KLOR-CON) 20 MEQ tablet   Oral   Take 1 tablet (20 mEq total) by mouth daily.   90 tablet   3   . rosuvastatin (CRESTOR) 10 MG tablet   Oral   Take 1 tablet (10 mg total) by mouth daily.   90 tablet   0   . valsartan (DIOVAN) 80 MG tablet   Oral   Take 20 mg by mouth daily.           BP 138/77  Pulse 67  Temp(Src) 97.6 F (36.4 C) (Oral)  Resp 18  SpO2 99%  Physical Exam  Nursing notes reviewed.  Electronic medical record reviewed. VITAL SIGNS:   Filed Vitals:   06/22/12 2237 06/23/12 0023  BP: 147/63 138/77  Pulse: 67   Temp: 98 F (36.7 C) 97.6 F (36.4 C)  TempSrc: Oral Oral  Resp: 14 18  SpO2: 96% 99%   CONSTITUTIONAL: Awake, oriented, appears non-toxic HENT: Atraumatic, normocephalic,  oral mucosa pink and moist, airway patent. Nares patent without drainage. External ears normal. EYES: Conjunctiva clear, EOMI, PERRLA NECK: Trachea midline, non-tender, supple CARDIOVASCULAR: Normal heart rate, Normal rhythm, No murmurs, rubs, gallops PULMONARY/CHEST: Clear to auscultation, no rhonchi, wheezes, or rales. Symmetrical breath sounds. Non-tender. ABDOMINAL: Non-distended, soft, non-tender - no rebound or guarding.  BS normal. NEUROLOGIC: Non-focal, moving all four extremities, no gross sensory or motor deficits. EXTREMITIES: No clubbing, cyanosis, or edema SKIN: Warm, Dry, No erythema, No rash  ED Course  Procedures (including critical care time)  Date: 06/23/2012  Rate: 65  Rhythm: normal sinus rhythm  QRS Axis: normal  Intervals: normal  ST/T Wave abnormalities: normal  Conduction Disutrbances: none  Narrative Interpretation: Normal sinus rhythm, no change from prior EKGs, no ST elevation or depression  Labs Reviewed  CBC - Abnormal; Notable for the following:    HCT 35.3 (*)    All other components within normal limits  BASIC METABOLIC PANEL - Abnormal; Notable for the following:    Glucose, Bld 103 (*)    GFR calc non Af Amer 74 (*)    GFR calc Af Amer 85 (*)    All other components within normal limits  POCT I-STAT TROPONIN I   Dg Chest 2 View  06/22/2012  *RADIOLOGY REPORT*  Clinical Data: Chest pain  CHEST - 2 VIEW  Comparison: 11/16/2011  Findings: Lungs are clear. No pleural effusion or pneumothorax. The cardiomediastinal contours are within normal limits. The visualized bones and soft tissues are without significant appreciable abnormality.  IMPRESSION: No radiographic evidence of acute cardiopulmonary process.   Original Report Authenticated By: Jearld Lesch, M.D.      1. Chest pain       MDM  [name] is a 62 y.o. female history of chronic anginal chest pain thought to be microvascular angina, last catheterization about 10 years ago.  Recent stress test 4/22 was negative.  Patient's pain is typically relieved by nitroglycerin but was not tonight, concern for ACS versus an STEMI, crescendo angina,, troponin is negative, EKG is unremarkable, nonischemic, discussed with Dr. Mayford Knife  on call for at [facility] cardiology, patient to be admitted to hospitalist service for rule out ACS patient is admitted stable.        Jones Skene, MD 06/23/12 [phone]

## 2012-06-23 NOTE — Discharge Summary (Signed)
Physician Discharge Summary  [name] WGN:[id number] DOB: [birth year] DOA: 06/23/2012  PCP: Eartha Inch, MD  Admit date: 06/23/2012 Discharge date: 06/23/2012  Time spent: 35 minutes  Recommendations for Outpatient Follow-up:  1. Followup with regular cardiologist Dr. Shirlee Latch in one to 2 weeks 2. Follow up with regular M.D. in 1 week  3. Started this admission and his long-acting med  Discharge Diagnoses:  Active Problems:   Angina pectoris syndrome   Hypertension   Hyperlipemia   Discharge Condition: Stable  Diet recommendation: Healthy low-salt   Filed Weights   06/23/12 0444  Weight: 83.19 kg (183 lb 6.4 oz)    History of present illness:  62 year old Caucasian female admitted 5 40,014 chest pain. She recently had a nuclear stress test 06/09/2012 with low-risk stress nuclear with apical thinning and EF of 80%. She had seen Dr. Shirlee Latch on 05/30/2038 chronic shortness of breath discomfort. In she's had a element of chest pain for 10 years. This tends to resolve nitroglycerin. She has microvascular angina and was noted to really tolerate Imdur.] She ruled out for cardiac enzymes and had a discussion with Dr. Lawanna Kobus cardiology who recommended filed in once again. We'll start this at night. Ever f care physician as well as her cardiologist and follow one to 2 weeks for further workup.   Discharge Exam: Filed Vitals:   06/22/12 2237 06/23/12 0023 06/23/12 0444 06/23/12 0830  BP: 147/63 138/77 136/76 112/61  Pulse: 67  63 65  Temp: 98 F (36.7 C) 97.6 F (36.4 C) 97.5 F (36.4 C) 98.4 F (36.9 C)  TempSrc: Oral Oral  Oral  Resp: 14 18 20 18   Height:   5\' 4"  (1.626 m)   Weight:   83.19 kg (183 lb 6.4 oz)   SpO2: 96% 99% 95% 97%   Doing fiar no apparent distress  General: nad Cardiovascular: s1 s2 no m/r/g Respiratory: Chest clear  Discharge Instructions   Future Appointments Provider Department Dept Phone   08/01/2012 7:45 AM [facility] [employer]  Main Office [place]) [phone]       Medication List    TAKE these medications       aspirin EC 81 MG tablet  Take 81 mg by mouth daily.     colesevelam 625 MG tablet  Commonly known as:  WELCHOL  Take 3 tablets (1,875 mg total) by mouth daily.     hydrochlorothiazide 25 MG tablet  Commonly known as:  HYDRODIURIL  Take 25 mg by mouth daily.     isosorbide mononitrate 30 MG 24 hr tablet  Commonly known as:  IMDUR  Take 1 tablet (30 mg total) by mouth at bedtime and may repeat dose one time if needed.     metoprolol succinate 50 MG 24 hr tablet  Commonly known as:  TOPROL-XL  Take 1 tablet (50 mg total) by mouth daily. Take with or immediately following a meal.     nitroGLYCERIN 0.4 MG SL tablet  Commonly known as:  NITROSTAT  Place 0.4 mg under the tongue every 5 (five) minutes as needed.     omega-3 acid ethyl esters 1 G capsule  Commonly known as:  LOVAZA  Take 2 g by mouth daily.     potassium chloride SA 20 MEQ tablet  Commonly known as:  K-DUR,KLOR-CON  Take 1 tablet (20 mEq total) by mouth daily.     rosuvastatin 10 MG tablet  Commonly known as:  CRESTOR  Take 1 tablet (10 mg total) by mouth  daily.     valsartan 80 MG tablet  Commonly known as:  DIOVAN  Take 20 mg by mouth daily.       No Known Allergies    The results of significant diagnostics from this hospitalization (including imaging, microbiology, ancillary and laboratory) are listed below for reference.    Significant Diagnostic Studies: Dg Chest 2 View  06/22/2012  *RADIOLOGY REPORT*  Clinical Data: Chest pain  CHEST - 2 VIEW  Comparison: 11/16/2011  Findings: Lungs are clear. No pleural effusion or pneumothorax. The cardiomediastinal contours are within normal limits. The visualized bones and soft tissues are without significant appreciable abnormality.  IMPRESSION: No radiographic evidence of acute cardiopulmonary process.   Original Report Authenticated By: Jearld Lesch, M.D.      Microbiology: No results found for this or any previous visit (from the past 240 hour(s)).   Labs: Basic Metabolic Panel:  Recent Labs Lab 06/22/12 2255 06/23/12 0530  NA 140 140  K 3.7 3.5  CL 102 103  CO2 29 30  GLUCOSE 103* 104*  BUN 15 13  CREATININE 0.84 0.78  CALCIUM 9.6 9.7   Liver Function Tests: No results found for this basename: AST, ALT, ALKPHOS, BILITOT, PROT, ALBUMIN,  in the last 168 hours No results found for this basename: LIPASE, AMYLASE,  in the last 168 hours No results found for this basename: AMMONIA,  in the last 168 hours CBC:  Recent Labs Lab 06/22/12 2255 06/23/12 0530  WBC 6.8 6.2  HGB 12.5 11.8*  HCT 35.3* 33.6*  MCV 84.2 83.4  PLT 211 179   Cardiac Enzymes:  Recent Labs Lab 06/23/12 0353 06/23/12 1020  TROPONINI <0.30 <0.30   BNP: BNP (last 3 results) No results found for this basename: PROBNP,  in the last 8760 hours CBG: No results found for this basename: GLUCAP,  in the last 168 hours     Signed:  Rhetta Mura  [facility] 06/23/2012, 1:52 PM

## 2012-06-23 NOTE — H&P (Signed)
[facility] History and Physical  [name] ZOX:[id number] DOB: [birth year] DOA: 06/23/2012  Referring physician: EDP PCP: Eartha Inch, MD  Specialists:   Chief Complaint: CHest Pain  HPI: [name] is a 62 y.o. female who presents to the ED with complaints of 8/10 Substernal Chest pain that was unrelieved after 3 SL NTGs.  She reports that the pain was like a heaviness, and her usual chest pain is usually relieved by 3 NTGs.   She reports having nausea but  No vomiting or diaphoresis associated with the pain, and there was no radiation of the pain.   The pain lasted 2.5 hours.   She was evluated in the ED and her initial workup was negative for EKG changes  And her troponin level was within normal limits.   She was referred for a cardiac rule out.  Her cardiologist is Dr. Shirlee Latch.      Review of Systems: The patient denies anorexia, fever, weight loss, vision loss, decreased hearing, hoarseness, syncope, dyspnea on exertion, peripheral edema, balance deficits, hemoptysis, abdominal pain, melena, hematochezia, severe indigestion/heartburn, hematuria, incontinence, genital sores, muscle weakness, suspicious skin lesions, transient blindness, difficulty walking, depression, unusual weight change, abnormal bleeding, enlarged lymph nodes, angioedema, and breast masses.    Past Medical History  Diagnosis Date  . Hypertension   . Hypercholesteremia   . Syncope 2008    syncopal episode, probably related to excessive stress, activity, and fatigue      . 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 Shortness of breath   . Edema of lower extremity   . Fatigue   . Sinus problem   . History of cardiovascular stress test 09/09/2008    EF - 72%  /  normal nuclear study   Past Surgical History  Procedure Laterality Date  . Cardiac catheterization  2002    normal  . Cardiac catheterization      Medications:  HOME MEDS: Prior to Admission medications   Medication  Sig Start Date End Date Taking? Authorizing Provider  aspirin EC 81 MG tablet Take 81 mg by mouth daily.   Yes Historical Provider, MD  colesevelam (WELCHOL) 625 MG tablet Take 3 tablets (1,875 mg total) by mouth daily. 06/05/12  Yes Laurey Morale, MD  hydrochlorothiazide (HYDRODIURIL) 25 MG tablet Take 25 mg by mouth daily.   Yes Historical Provider, MD  metoprolol succinate (TOPROL-XL) 50 MG 24 hr tablet Take 1 tablet (50 mg total) by mouth daily. Take with or immediately following a meal. 05/29/12  Yes Laurey Morale, MD  nitroGLYCERIN (NITROSTAT) 0.4 MG SL tablet Place 0.4 mg under the tongue every 5 (five) minutes as needed. 10/19/10  Yes Laurey Morale, MD  omega-3 acid ethyl esters (LOVAZA) 1 G capsule Take 2 g by mouth daily.   Yes Historical Provider, MD  potassium chloride SA (K-DUR,KLOR-CON) 20 MEQ tablet Take 1 tablet (20 mEq total) by mouth daily. 05/30/12  Yes Laurey Morale, MD  rosuvastatin (CRESTOR) 10 MG tablet Take 1 tablet (10 mg total) by mouth daily. 05/30/12  Yes Laurey Morale, MD  valsartan (DIOVAN) 80 MG tablet Take 20 mg by mouth daily.   Yes Historical Provider, MD    Allergies:  No Known Allergies   Social History:  reports that she has never smoked. She has never used smokeless tobacco. She reports that she does not drink alcohol or use illicit drugs.   Family History: Family History  Problem  Relation Age of Onset  . Heart disease Father   . Heart attack Mother     questionable  . Lung cancer Mother   . Coronary artery disease      strong family history      Physical Exam:  GEN:  Pleasant  Well nourished and well developed Caucasian Female examined  and in no acute distress; cooperative with exam Filed Vitals:   06/22/12 2237 06/23/12 0023  BP: 147/63 138/77  Pulse: 67   Temp: 98 F (36.7 C) 97.6 F (36.4 C)  TempSrc: Oral Oral  Resp: 14 18  SpO2: 96% 99%   Blood pressure 138/77, pulse 67, temperature 97.6 F (36.4 C), temperature source  Oral, resp. rate 18, SpO2 99.00%. PSYCH: She is alert and oriented x4; does not appear anxious does not appear depressed; affect is normal HEENT: Normocephalic and Atraumatic, Mucous membranes pink; PERRLA; EOM intact; Fundi:  Benign;  No scleral icterus, Nares: Patent, Oropharynx: Clear, Fair Dentition, Neck:  FROM, no cervical lymphadenopathy nor thyromegaly or carotid bruit; no JVD; Breasts:: Not examined CHEST WALL: No tenderness CHEST: Normal respiration, clear to auscultation bilaterally HEART: Regular rate and rhythm; no murmurs rubs or gallops BACK: No kyphosis or scoliosis; no CVA tenderness ABDOMEN: Positive Bowel Sounds, soft non-tender; no masses, no organomegaly.   Rectal Exam: Not done EXTREMITIES: No cyanosis, clubbing or edema; no ulcerations. Genitalia: not examined PULSES: 2+ and symmetric SKIN: Normal hydration no rash or ulceration CNS: Cranial nerves 2-12 grossly intact no focal neurologic deficit   Labs & Imaging Results for orders placed during the hospital encounter of 06/23/12 (from the past 48 hour(s))  CBC     Status: Abnormal   Collection Time    06/22/12 10:55 PM      Result Value Range   WBC 6.8  4.0 - 10.5 K/uL   RBC 4.19  3.87 - 5.11 MIL/uL   Hemoglobin 12.5  12.0 - 15.0 g/dL   HCT 16.1 (*) 09.6 - 04.5 %   MCV 84.2  78.0 - 100.0 fL   MCH 29.8  26.0 - 34.0 pg   MCHC 35.4  30.0 - 36.0 g/dL   RDW 40.9  81.1 - 91.4 %   Platelets 211  150 - 400 K/uL  BASIC METABOLIC PANEL     Status: Abnormal   Collection Time    06/22/12 10:55 PM      Result Value Range   Sodium 140  135 - 145 mEq/L   Potassium 3.7  3.5 - 5.1 mEq/L   Chloride 102  96 - 112 mEq/L   CO2 29  19 - 32 mEq/L   Glucose, Bld 103 (*) 70 - 99 mg/dL   BUN 15  6 - 23 mg/dL   Creatinine, Ser 7.82  0.50 - 1.10 mg/dL   Calcium 9.6  8.4 - 95.6 mg/dL   GFR calc non Af Amer 74 (*) >90 mL/min   GFR calc Af Amer 85 (*) >90 mL/min   Comment:            The eGFR has been calculated     using the  CKD EPI equation.     This calculation has not been     validated in all clinical     situations.     eGFR's persistently     <90 mL/min signify     possible Chronic Kidney Disease.  POCT I-STAT TROPONIN I     Status: None   Collection Time  06/22/12 11:05 PM      Result Value Range   Troponin i, poc 0.00  0.00 - 0.08 ng/mL   Comment 3            Comment: Due to the release kinetics of cTnI,     a negative result within the first hours     of the onset of symptoms does not rule out     myocardial infarction with certainty.     If myocardial infarction is still suspected,     repeat the test at appropriate intervals.    EKG: Independently reviewed.   Assessment/Plan Active Problems:   Angina pectoris syndrome   Hypertension   Hyperlipemia  1.  Angina Pectoris Syndrome- admitted for Cardiac Rule Out,  Cycle Troponins, and placed of Nitropaste, O2, and ASA.     2.  HTN-   Monitor and continue home medications , Metoprolol, Diovan, and hctz.     3.  Hyperlipidemia-  Continue Crestor and Welchol.    4.  DVT prophylaxis with Lovenox.      Code Status:    FULL CODE Family Communication:     No Family at Bedside Disposition Plan:    Return to Home on Discharge  Time spent:  27 Minutes  Ron Parker [facility] Pager [phone]  If 7PM-7AM, please contact night-coverage [email or web address] Password [facility] 06/23/2012, 3:48 AM

## 2012-06-25 ENCOUNTER — Telehealth: Payer: Self-pay | Admitting: Cardiology

## 2012-06-25 MED ORDER — RANOLAZINE ER 500 MG PO TB12: 500.0000 mg | ORAL_TABLET | Freq: Two times a day (BID) | ORAL | Status: DC

## 2012-06-25 NOTE — Telephone Encounter (Signed)
Recent normal stress test.  Stop Imdur due to headache, restart Toprol XL at prior dose.  Would start ranolazine 500 mg bid for microvascular angina and arrange followup with ECG in 1 week with PA.

## 2012-06-25 NOTE — Telephone Encounter (Signed)
Spoke with patient. Pt states she went to ED over the weekend with chest pain. Pt was told she did not have MI. Chest pain was the same as in the past but a little worse and not relieved with 3 NTG. Pt was told to start Imdur 30mg  daily. It has given her a terrible headache that is keeping her up at night.  She was also told to stop metoprolol since she started Imdur because Imdur might lower her BP too much. She was told she might need a heart cath but would let Dr Shirlee Latch make that decision.  I will forward to Dr Shirlee Latch for review.

## 2012-06-25 NOTE — Telephone Encounter (Signed)
LMTCB

## 2012-06-25 NOTE — Telephone Encounter (Signed)
New problem    Pt went to ER with chest pain and physician put her on Imdur 30mg . Pt is having headaches from this. Pt was told to stop a medication Metoprolol that Dr Shirlee Latch put her on and she want to talk to you concerning this.

## 2012-06-25 NOTE — Telephone Encounter (Signed)
Pt advised, verbalized understanding. Pt states previous dose of Toprol XL is 25mg  daily not 50mg  daily.

## 2012-06-27 ENCOUNTER — Telehealth: Payer: Self-pay | Admitting: Cardiology

## 2012-07-05 ENCOUNTER — Telehealth: Payer: Self-pay | Admitting: Cardiology

## 2012-07-05 NOTE — Telephone Encounter (Signed)
Spoke with pt and told her we did not have any samples of Ranexa in office at this time.

## 2012-07-05 NOTE — Telephone Encounter (Signed)
New problem   Pt want to know if we have samples of Ranexa for her to come and pick up. Please call pt.

## 2012-07-08 NOTE — Telephone Encounter (Signed)
The cost will be prohibitive with her other medications. I have reviewed with Dr Shirlee Latch. He recommended pt keep appt with Bing Neighbors on 07/10/12 even though she has not started Ranexa yet and since she was in [facility] 06/23/12. Will plan to provide a written prescription for Ranexa along with copay card for pt to take to pharmacy to see if cost would be reasonable for her with copay card. Pt aware of the above.

## 2012-07-08 NOTE — Telephone Encounter (Signed)
No samples of Ranexa 500mg  at current time, do have co pay card. LMTCB for pt to discuss.

## 2012-07-08 NOTE — Telephone Encounter (Signed)
LMTCB

## 2012-07-08 NOTE — Telephone Encounter (Signed)
Would give her copay card and samples

## 2012-07-08 NOTE — Telephone Encounter (Signed)
Spoke with pt. Pt states she pays a percentage of the cost of her medications.

## 2012-07-10 ENCOUNTER — Encounter: Payer: Self-pay | Admitting: Physician Assistant

## 2012-07-10 ENCOUNTER — Ambulatory Visit (INDEPENDENT_AMBULATORY_CARE_PROVIDER_SITE_OTHER): Payer: PRIVATE HEALTH INSURANCE | Admitting: Physician Assistant

## 2012-07-10 VITALS — BP 138/90 | HR 64 | Ht 64.0 in | Wt 183.4 lb

## 2012-07-10 DIAGNOSIS — E785 Hyperlipidemia, unspecified: Secondary | ICD-10-CM

## 2012-07-10 DIAGNOSIS — R5381 Other malaise: Secondary | ICD-10-CM

## 2012-07-10 DIAGNOSIS — I209 Angina pectoris, unspecified: Secondary | ICD-10-CM

## 2012-07-10 DIAGNOSIS — R5383 Other fatigue: Secondary | ICD-10-CM

## 2012-07-10 DIAGNOSIS — I10 Essential (primary) hypertension: Secondary | ICD-10-CM

## 2012-07-10 MED ORDER — AMLODIPINE BESYLATE 2.5 MG PO TABS: 2.5000 mg | ORAL_TABLET | Freq: Every day | ORAL | Status: DC

## 2012-07-10 MED ORDER — RANOLAZINE ER 500 MG PO TB12: 500.0000 mg | ORAL_TABLET | Freq: Two times a day (BID) | ORAL | Status: DC

## 2012-07-10 NOTE — Progress Notes (Signed)
[address], [address] [place], [place]  [address] Phone: [phone] Fax:  [phone]  Date:  07/10/2012   ID:  [name], [name] 31-May-1950, MRN [id number]  PCP:  Eartha Inch, MD  Primary Cardiologist:  Dr. Marca Ancona     History of Present Illness: [name] is a 62 y.o. female who returns for follow up. She has a hx of microvascular angina and hyperlipidemia.  She has had chest pain for over 10 years. She has had a pattern of tightness in her chest sometimes radiating to the left arm as well as dyspnea when walking up hills or if she carries a heavy load up steps. This tends to resolve if she takes nitroglycerin.  Last seen by Dr. Shirlee Latch in 05/2012. She was having worsening chest pain.  ETT-Myoview 06/12/12: Low risk, apical thinning, no ischemia, EF 80%.  Patient was then admitted 5/4 with chest pain. Cardiac markers remained normal. She was placed back on isosorbide. She could not tolerate this due to headaches. It was recommended that she try Ranexa 500 mg bid.  However, this was too expensive.  She notes her chest pain is improved.  It did last longer than usual.  Still feels fatigued.  Does not take K+ on consistent basis.  Still notes exertional chest heaviness.  Notes assoc arm/jaw pain, dyspnea, diaphoresis and nausea.  She denies syncope, orthopnea, PND, edema.    Labs (5/12): K 3.6, creatinine 0.9, TGs 178, HDL 57, LDL 160  Labs (2/13): HDL 58, LDL 86, TGs 203  Labs (8/13): HDL 59, LDL 102, LFTs normal  Labs (9/13): AST 40, ALT 40, K 3.3, creatinine 0.87  Labs (10/13): K 3.8, creatinine 0.8, LFTs normal  Labs (5/14):   K 3.5, Cr 0.78, Hgb 11.8   Wt Readings from Last 3 Encounters:  07/10/12 183 lb 6.4 oz (83.19 kg)  06/23/12 183 lb 6.4 oz (83.19 kg)  06/11/12 181 lb (82.101 kg)    Past Medical History: 1. Microvascular angina: Patient had left heart cath in 3/02 with normal coronaries. She had a stress cardiolite in 7/10 that was normal. Unable  to tolerate Imdur. ETT-Myoview 06/12/12: Low risk, apical thinning, no ischemia, EF 80% 2. Right renal artery stenosis noted on 3/12 cath  3. HTN  4. Vasovagal symptoms  5. Hyperlipidemia: Myalgias with several statins, tolerating low dose Crestor.   Current Outpatient Prescriptions  Medication Sig Dispense Refill  . aspirin EC 81 MG tablet Take 81 mg by mouth daily.      . colesevelam (WELCHOL) 625 MG tablet Take 3 tablets (1,875 mg total) by mouth daily.  90 tablet  1  . hydrochlorothiazide (HYDRODIURIL) 25 MG tablet Take 25 mg by mouth daily.      . metoprolol succinate (TOPROL-XL) 25 MG 24 hr tablet Take 1 tablet (25 mg total) by mouth daily.      . nitroGLYCERIN (NITROSTAT) 0.4 MG SL tablet Place 0.4 mg under the tongue every 5 (five) minutes as needed.      Marland Kitchen omega-3 acid ethyl esters (LOVAZA) 1 G capsule Take 2 g by mouth daily.      . potassium chloride SA (K-DUR,KLOR-CON) 20 MEQ tablet Take 1 tablet (20 mEq total) by mouth daily.  90 tablet  3  . rosuvastatin (CRESTOR) 10 MG tablet Take 1 tablet (10 mg total) by mouth daily.  90 tablet  0  . valsartan (DIOVAN) 80 MG tablet Take 20 mg by mouth daily.  No current facility-administered medications for this visit.    Allergies:   No Known Allergies  Social History:  The patient  reports that she has never smoked. She has never used smokeless tobacco. She reports that she does not drink alcohol or use illicit drugs.   ROS:  Please see the history of present illness.   She notes a nonproductive cough.   All other systems reviewed and negative.   PHYSICAL EXAM: VS:  BP 138/90  Pulse 64  Ht 5\' 4"  (1.626 m)  Wt 183 lb 6.4 oz (83.19 kg)  BMI 31.47 kg/m2 Well nourished, well developed, in no acute distress HEENT: normal Neck: no JVD Cardiac:  normal S1, S2; RRR; no murmur Lungs:  clear to auscultation bilaterally, no wheezing, rhonchi or rales Abd: soft, nontender, no hepatomegaly Ext: no edema Skin: warm and dry Neuro:   CNs 2-12 intact, no focal abnormalities noted  EKG:  NSR, HR 64, normal axis, QTc 437     ASSESSMENT AND PLAN:  1. Chest Pain: She has a long history of chest discomfort felt to be secondary to microvascular angina. She had a recent low risk Myoview.  Cardiac markers were normal during her recent admission for chest pain.  She has been intolerant to Imdur due to headaches. Ranexa is too expensive. We discussed trying amlodipine. She is willing to try this. It was made known to me that we also have a co-pay card for Ranexa. I have given her the option of trying Ranexa versus amlodipine 2.5 mg daily. If she starts amlodipine, she will hold her Diovan and keep an eye on her blood pressures. If she starts Ranexa, we will arrange a follow up ECG in 1 week. 2. Hypertension: Controlled.  She will try to take K+ more consistently.   3. Hyperlipidemia: Continue statin. 4. Fatigue:  May be related to HypoK+.  If persists with taking K+ regularly, would repeat TSH (TSH normal in 11/2011). 5. Disposition: Follow up with me in one month.  Signed, Tereso Newcomer, PA-C  3:16 PM 07/10/2012

## 2012-07-10 NOTE — Patient Instructions (Addendum)
A PRESCRIPTION FOR RANEXA 500 MG 1 TABLET TWICE DAILY AND AN RX FOR AMLODIPINE 2.5 MG DAILY HAVE BOTH BEEN SENT TO THE [facility]  IF YOU CHOOSE TO TAKE RANEXA YOU WILL CONTINUE THE DIOVAN HOWEVER; IF YOU CHOOSE TO TAKE THE AMLODIPINE YOU WILL STOP THE DIOVAN; PLEASE CALL [phone] SCOTT WEAVER, PAC TO LET us KNOW WHICH MEDICATION YOU HAVE DECIDED ON TAKING (RANEXA OR AMLODIPINE)  IF YOU DO CHOOSE RANEXA YOU WILL NEED AN EKG 1 WEEK AFTER STARTING; IF SO THEN PLEASE CALL THE OFFICE TO SCHEDULE FOR EKG TO BE DONE WITH NURSE VISIT  MONITOR BP AND CALL IF > 140/90  PLEASE FOLLOW UP IN ABOUT 4 WEEKS WITH SCOTT WEAVER, PAC SAME DAY DR. Shirlee Latch IS IN THE OFFICE

## 2012-07-12 ENCOUNTER — Ambulatory Visit: Payer: PRIVATE HEALTH INSURANCE | Admitting: Physician Assistant

## 2012-07-26 ENCOUNTER — Telehealth: Payer: Self-pay | Admitting: Cardiology

## 2012-07-26 DIAGNOSIS — I10 Essential (primary) hypertension: Secondary | ICD-10-CM

## 2012-07-26 MED ORDER — AMLODIPINE BESYLATE 2.5 MG PO TABS: 2.5000 mg | ORAL_TABLET | Freq: Two times a day (BID) | ORAL | Status: DC

## 2012-07-26 NOTE — Telephone Encounter (Signed)
Follow up  ° ° ° ° °Pt is returning your call  °

## 2012-07-26 NOTE — Telephone Encounter (Signed)
lmtcb

## 2012-07-26 NOTE — Telephone Encounter (Signed)
New problem   Pt wanted to discuss medication and wanted to let someone know that she went to the ER yesterday but didn't go in

## 2012-07-26 NOTE — Telephone Encounter (Signed)
Pt had an episode yesterday that was the same as the May occurrence. She was sitting/ not doing anything. Felt cp that started in her throat and maintained in her throat/ chest/neck. Pt was then in car with spouse, she took nitro x 3 without help so they drove to [facility] which was a 35 min drive, the pain subsided while in the [facility] parking lot so they went home, pt is feeling weak and tired today.  I reviewed with Tereso Newcomer PA. Pt to increase amlodipine to 2.5 mg BID , bp avg, 144/63, 136/62, 129/54, current 122/68 Try Maalox before meals 3 x day Try OTC prilosec Go to Ed this weekend with further episodes Call  Monday  With further questions or concerns Pt aware I will forward to dr/nurse.

## 2012-08-01 ENCOUNTER — Other Ambulatory Visit: Payer: PRIVATE HEALTH INSURANCE

## 2012-08-05 ENCOUNTER — Encounter: Payer: Self-pay | Admitting: Physician Assistant

## 2012-08-05 ENCOUNTER — Other Ambulatory Visit (INDEPENDENT_AMBULATORY_CARE_PROVIDER_SITE_OTHER): Payer: PRIVATE HEALTH INSURANCE

## 2012-08-05 ENCOUNTER — Ambulatory Visit (INDEPENDENT_AMBULATORY_CARE_PROVIDER_SITE_OTHER): Payer: PRIVATE HEALTH INSURANCE | Admitting: Physician Assistant

## 2012-08-05 VITALS — BP 132/80 | HR 69 | Ht 64.0 in | Wt 185.4 lb

## 2012-08-05 DIAGNOSIS — I10 Essential (primary) hypertension: Secondary | ICD-10-CM

## 2012-08-05 DIAGNOSIS — E876 Hypokalemia: Secondary | ICD-10-CM

## 2012-08-05 DIAGNOSIS — R079 Chest pain, unspecified: Secondary | ICD-10-CM

## 2012-08-05 LAB — HEPATIC FUNCTION PANEL
Bilirubin, Direct: 0 mg/dL (ref 0.0–0.3)
Total Bilirubin: 0.5 mg/dL (ref 0.3–1.2)
Total Protein: 7 g/dL (ref 6.0–8.3)

## 2012-08-05 LAB — LIPID PANEL
Cholesterol: 175 mg/dL (ref 0–200)
LDL Cholesterol: 84 mg/dL (ref 0–99)
VLDL: 36.8 mg/dL (ref 0.0–40.0)

## 2012-08-05 NOTE — Progress Notes (Signed)
[address], [address] [place], [place]  [address] Phone: [phone] Fax:  [phone]  Date:  08/05/2012   ID:  [name], DOB Aug 06, 1950, MRN [id number]  PCP:  Eartha Inch, MD  Primary Cardiologist:  Dr. Marca Ancona     History of Present Illness: [name] is a 62 y.o. female who returns for follow up. She has a hx of microvascular angina and hyperlipidemia.  She has had chest pain for over 10 years. She has had a pattern of tightness in her chest sometimes radiating to the left arm as well as dyspnea when walking up hills or if she carries a heavy load up steps. This tends to resolve if she takes nitroglycerin.  Last seen by Dr. Shirlee Latch in 05/2012. She was having worsening chest pain.  ETT-Myoview 06/12/12: Low risk, apical thinning, no ischemia, EF 80%.  Patient was then admitted 5/4 with chest pain. Cardiac markers remained normal. She was placed back on isosorbide. She could not tolerate this due to headaches. It was recommended that she try Ranexa 500 mg bid.  However, this was too expensive.  I saw her 07/10/12 in follow up.  We discussed starting Amlodipine vs trying to obtain Ranexa.  She started Amlodipine.  This was titrated to bid recently b/c of recurrent symptoms.  Since increasing the dose of Amlodipine, she has not had further CP.  No dyspnea, syncope, orthopnea, edema.    Labs (5/12): K 3.6, creatinine 0.9, TGs 178, HDL 57, LDL 160  Labs (2/13): HDL 58, LDL 86, TGs 203  Labs (8/13): HDL 59, LDL 102, LFTs normal  Labs (9/13): AST 40, ALT 40, K 3.3, creatinine 0.87  Labs (10/13): K 3.8, creatinine 0.8, LFTs normal  Labs (5/14):   K 3.5, Cr 0.78, Hgb 11.8   Wt Readings from Last 3 Encounters:  08/05/12 185 lb 6.4 oz (84.097 kg)  07/10/12 183 lb 6.4 oz (83.19 kg)  06/23/12 183 lb 6.4 oz (83.19 kg)    Past Medical History: 1. Microvascular angina: Patient had left heart cath in 3/02 with normal coronaries. She had a stress cardiolite in 7/10  that was normal. Unable to tolerate Imdur. ETT-Myoview 06/12/12: Low risk, apical thinning, no ischemia, EF 80% 2. Right renal artery stenosis noted on 3/12 cath  3. HTN  4. Vasovagal symptoms  5. Hyperlipidemia: Myalgias with several statins, tolerating low dose Crestor.   Current Outpatient Prescriptions  Medication Sig Dispense Refill  . amLODipine (NORVASC) 2.5 MG tablet Take 1 tablet (2.5 mg total) by mouth 2 (two) times daily.  180 tablet  3  . aspirin EC 81 MG tablet Take 81 mg by mouth daily.      . colesevelam (WELCHOL) 625 MG tablet Take 3 tablets (1,875 mg total) by mouth daily.  90 tablet  1  . hydrochlorothiazide (HYDRODIURIL) 25 MG tablet Take 25 mg by mouth daily.      . metoprolol succinate (TOPROL-XL) 25 MG 24 hr tablet Take 1 tablet (25 mg total) by mouth daily.      . nitroGLYCERIN (NITROSTAT) 0.4 MG SL tablet Place 0.4 mg under the tongue every 5 (five) minutes as needed.      Marland Kitchen omega-3 acid ethyl esters (LOVAZA) 1 G capsule Take 2 g by mouth daily.      . potassium chloride SA (K-DUR,KLOR-CON) 20 MEQ tablet Take 1 tablet (20 mEq total) by mouth daily.  90 tablet  3  . rosuvastatin (CRESTOR) 10 MG tablet Take  1 tablet (10 mg total) by mouth daily.  90 tablet  0   No current facility-administered medications for this visit.    Allergies:   No Known Allergies  Social History:  The patient  reports that she has never smoked. She has never used smokeless tobacco. She reports that she does not drink alcohol or use illicit drugs.    PHYSICAL EXAM: VS:  BP 132/80  Pulse 69  Ht 5\' 4"  (1.626 m)  Wt 185 lb 6.4 oz (84.097 kg)  BMI 31.81 kg/m2 Well nourished, well developed, in no acute distress HEENT: normal Neck: no JVD Cardiac:  normal S1, S2; RRR; no murmur Lungs:  clear to auscultation bilaterally, no wheezing, rhonchi or rales Abd: soft, nontender, no hepatomegaly Ext: no edema Skin: warm and dry Neuro:  CNs 2-12 intact, no focal abnormalities noted  EKG:  NSR,  HR 69, no changes from prior tracings     ASSESSMENT AND PLAN:  1. Chest Pain:  Improved on current regimen.  Continue current Rx.  She cannot afford Ranexa.  If she has worsening symptoms, could try Imipramine or L-arginine.   2. Hypertension:  Controlled.     3. Hyperlipidemia: Continue statin. 4. Disposition: Follow up with Dr. Marca Ancona in October or sooner prn.  Signed, Tereso Newcomer, PA-C  9:14 AM 08/05/2012

## 2012-08-05 NOTE — Patient Instructions (Addendum)
TRY TO TAKE NORVASC ALL IN THE MORNING AND SEE IF THIS HELPS  PLEASE FOLLOW UP WITH DR. Shirlee Latch IN 11/2012

## 2012-08-09 ENCOUNTER — Telehealth: Payer: Self-pay | Admitting: Cardiology

## 2012-08-09 NOTE — Telephone Encounter (Signed)
New problem ° ° ° °Pt calling for lab results °

## 2012-08-09 NOTE — Telephone Encounter (Signed)
Pt notified of lab results

## 2012-12-11 ENCOUNTER — Other Ambulatory Visit: Payer: Self-pay | Admitting: Cardiology

## 2012-12-24 ENCOUNTER — Ambulatory Visit (INDEPENDENT_AMBULATORY_CARE_PROVIDER_SITE_OTHER): Payer: PRIVATE HEALTH INSURANCE | Admitting: Cardiology

## 2012-12-24 ENCOUNTER — Encounter: Payer: Self-pay | Admitting: Cardiology

## 2012-12-24 VITALS — BP 128/86 | HR 63 | Ht 64.0 in | Wt 181.0 lb

## 2012-12-24 DIAGNOSIS — E785 Hyperlipidemia, unspecified: Secondary | ICD-10-CM

## 2012-12-24 DIAGNOSIS — I10 Essential (primary) hypertension: Secondary | ICD-10-CM

## 2012-12-24 DIAGNOSIS — I209 Angina pectoris, unspecified: Secondary | ICD-10-CM

## 2012-12-24 NOTE — Progress Notes (Signed)
Patient ID: [name], female   DOB: 1950-09-03, 62 y.o.   MRN: [id number] PCP: Dr. Cyndia Bent  62 yo with history of microvascular angina and hyperlipidemia presents for cardiology followup. She has had chest pain for over 10 years.  She has had a pattern of tightness in her chest sometimes radiating to the left arm as well as dyspnea when walking up hills or if she carries a heavy load up steps.  This tends to resolve if she takes nitroglycerin.  Last functional study was an ETT-Cardiolite in 4/14.  She walked for 4:46, there was no evidence for ischemia or infarction.  She has been unable to tolerate Imdur and ranolazine was too expensive.  Amlodipine 2.5 mg daily actually has helped considerably with her symptoms.  She is now able to walk up a hill slowly without chest pain.  She has not taken NTG lately.  No exertional dyspnea.  She is tolerating Crestor 5 mg daily with only mild muscle aches.   Labs (5/12): K 3.6, creatinine 0.9, TGs 178, HDL 57, LDL 160 Labs (2/13): HDL 58, LDL 86, TGs 203 Labs (8/13): HDL 59, LDL 102, LFTs normal Labs (9/13): AST 40, ALT 40, K 3.3, creatinine 0.87 Labs (10/13): K 3.8, creatinine 0.8, LFTs normal Labs (5/14): K 3.5, creatinine 0.78 Labs (6/14): LDL 84, HDL 54  PMH: 1. Microvascular angina: Patient had left heart cath in 3/02 with normal coronaries.  She had a stress cardiolite in 7/10 that was normal.  Unable to tolerate Imdur. 2. Right renal artery stenosis noted on 3/12 cath 3. HTN 4. Vasovagal symptoms 5. Hyperlipidemia: Myalgias with several statins, tolerating low dose Crestor.   SH: Married, lives with husband [place].  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  3 daughters.  Nonsmoker, no ETOH.   FH: Mother died at 20 with lung cancer, she thinks mother had an MI in her 30s.  Father had an MI in his 52s.    Current Outpatient Prescriptions  Medication Sig Dispense Refill  . amLODipine (NORVASC) 2.5 MG tablet Take 1 tablet  (2.5 mg total) by mouth 2 (two) times daily.  180 tablet  3  . aspirin EC 81 MG tablet Take 81 mg by mouth daily.      . hydrochlorothiazide (HYDRODIURIL) 25 MG tablet TAKE 1 TABLET BY MOUTH DAILY.  30 tablet  6  . LOVAZA 1 G capsule TAKE 2 CAPSULES BY MOUTH ONCE DAILY  60 capsule  PRN  . metoprolol succinate (TOPROL-XL) 25 MG 24 hr tablet Take 1 tablet (25 mg total) by mouth daily.      . nitroGLYCERIN (NITROSTAT) 0.4 MG SL tablet Place 0.4 mg under the tongue every 5 (five) minutes as needed.      . rosuvastatin (CRESTOR) 10 MG tablet TAKE 0.5 TABLET BY MOUTH DAILY.      . WELCHOL 625 MG tablet TAKE 3 TABLETS BY MOUTH DAILY.  90 tablet  3   No current facility-administered medications for this visit.    BP 128/86  Pulse 63  Ht 5\' 4"  (1.626 m)  Wt 82.101 kg (181 lb)  BMI 31.05 kg/m2  SpO2 94% General: NAD Neck: No JVD, no thyromegaly or thyroid nodule.  Lungs: Clear to auscultation bilaterally with normal respiratory effort. CV: Nondisplaced PMI.  Heart regular S1/S2, no S3/S4, no murmur.  No peripheral edema.  No carotid bruit.  Normal pedal pulses.  Abdomen: Soft, nontender, no hepatosplenomegaly, no distention.  Neurologic:  Alert and oriented x 3.  Psych: Normal affect. Extremities: No clubbing or cyanosis.   Assessment/Plan  Angina pectoris syndrome  Patient has had chronic chest pain with moderate to heavy exertion as well as rare episodes at rest. This pattern has been present for over 10 years. She is currently actually doing better than her baseline after starting amlodipine. She had a normal cath in 3/02 and a normal cardiolite in 4/14. She is thought to have microvascular angina. It is responsive to nitrates. She is on a beta blocker, ARB, ASA 81, amlodipine, and low dose statin. She was unable to tolerate Imdur.   As she is doing well, I will continue her current regimen.  Hyperlipemia  Good lipids in 6/14 on Crestor.   Marca Ancona 12/24/2012

## 2012-12-24 NOTE — Patient Instructions (Signed)
Your physician wants you to follow-up in: 6 months with Dr Shirlee Latch. (May 2015). You will receive a reminder letter in the mail two months in advance. If you don't receive a letter, please call our office to schedule the follow-up appointment.   Your physician recommends that you return for a FASTING lipid profile: in 6 months when you see Dr Shirlee Latch.

## 2012-12-27 ENCOUNTER — Ambulatory Visit: Payer: PRIVATE HEALTH INSURANCE | Admitting: Cardiology

## 2013-03-13 ENCOUNTER — Other Ambulatory Visit: Payer: Self-pay

## 2013-03-13 ENCOUNTER — Other Ambulatory Visit: Payer: Self-pay | Admitting: Family Medicine

## 2013-03-13 DIAGNOSIS — Z1231 Encounter for screening mammogram for malignant neoplasm of breast: Secondary | ICD-10-CM

## 2013-04-01 ENCOUNTER — Ambulatory Visit
Admission: RE | Admit: 2013-04-01 | Discharge: 2013-04-01 | Disposition: A | Discharge status: Home / Self Care | Payer: PRIVATE HEALTH INSURANCE | Source: Ambulatory Visit

## 2013-04-01 DIAGNOSIS — Z1231 Encounter for screening mammogram for malignant neoplasm of breast: Secondary | ICD-10-CM

## 2013-04-14 ENCOUNTER — Other Ambulatory Visit: Payer: Self-pay | Admitting: Cardiology

## 2013-07-15 ENCOUNTER — Other Ambulatory Visit: Payer: Self-pay | Admitting: Cardiology

## 2013-07-16 ENCOUNTER — Other Ambulatory Visit: Payer: Self-pay

## 2013-07-16 MED ORDER — METOPROLOL SUCCINATE ER 25 MG PO TB24: 25.0000 mg | ORAL_TABLET | Freq: Every day | ORAL | Status: DC

## 2013-07-30 ENCOUNTER — Ambulatory Visit (INDEPENDENT_AMBULATORY_CARE_PROVIDER_SITE_OTHER): Payer: BC Managed Care – PPO | Admitting: Cardiology

## 2013-07-30 ENCOUNTER — Encounter: Payer: Self-pay | Admitting: Cardiology

## 2013-07-30 VITALS — BP 142/70 | HR 61 | Ht 64.0 in | Wt 187.0 lb

## 2013-07-30 DIAGNOSIS — I10 Essential (primary) hypertension: Secondary | ICD-10-CM

## 2013-07-30 DIAGNOSIS — E785 Hyperlipidemia, unspecified: Secondary | ICD-10-CM

## 2013-07-30 NOTE — Patient Instructions (Signed)
Your physician recommends that you have a FASTING lipid profile today.  Your physician wants you to follow-up in: 6 months with Dr Aundra Dubin. (December 2015). You will receive a reminder letter in the mail two months in advance. If you don't receive a letter, please call our office to schedule the follow-up appointment.

## 2013-07-31 ENCOUNTER — Other Ambulatory Visit: Payer: Self-pay | Admitting: Physician Assistant

## 2013-07-31 LAB — LIPID PANEL
CHOL/HDL RATIO: 3
CHOLESTEROL: 164 mg/dL (ref 0–200)
HDL: 51.3 mg/dL (ref >39.00)
LDL CALC: 66 mg/dL (ref 0–99)
NonHDL: 112.7
TRIGLYCERIDES: 235 mg/dL — AB (ref 0.0–149.0)
VLDL: 47 mg/dL — AB (ref 0.0–40.0)

## 2013-08-01 ENCOUNTER — Telehealth: Payer: Self-pay | Admitting: *Deleted

## 2013-08-01 NOTE — Addendum Note (Signed)
Addended by: Katrine Coho on: 08/01/2013 07:17 AM   Modules accepted: Orders

## 2013-08-01 NOTE — Telephone Encounter (Signed)
Confirmed with patient she takes the amlodipine once daily, refill to [place]

## 2013-08-01 NOTE — Progress Notes (Signed)
Patient ID: [name], female   DOB: [birth year], 63 y.o.   MRN: [id number] PCP: Dr. Melford Gilmore  63 yo with history of microvascular angina and hyperlipidemia presents for cardiology followup. She has had chest pain for over 10 years.  She has had a pattern of tightness in her chest sometimes radiating to the left arm as well as dyspnea when walking up hills or if she carries a heavy load up steps.  This tends to resolve if she takes nitroglycerin.  Last functional study was an ETT-Cardiolite in 4/14.  She walked for 4:46, there was no evidence for ischemia or infarction.  She has been unable to tolerate Imdur and ranolazine was too expensive.  Amlodipine 2.5 mg daily actually has helped considerably with her symptoms.  She is getting chest pain only with walking up a steep hill, and then the chest pain is mild.  She actually was able to go hiking in the mountains in [place] recently.  No exertional dyspnea.  She is tolerating Crestor 5 mg daily with no myalgias.  Labs (5/12): K 3.6, creatinine 0.9, TGs 178, HDL 57, LDL 160 Labs (2/13): HDL 58, LDL 86, TGs 203 Labs (8/13): HDL 59, LDL 102, LFTs normal Labs (9/13): AST 40, ALT 40, K 3.3, creatinine 0.87 Labs (10/13): K 3.8, creatinine 0.8, LFTs normal Labs (5/14): K 3.5, creatinine 0.78 Labs (6/14): LDL 84, HDL 54  ECG: NSR, normal  PMH: 1. Microvascular angina: Patient had left heart cath in 3/02 with normal coronaries.  She had a stress cardiolite in 7/10 that was normal.  Unable to tolerate Imdur. 2. Right renal artery stenosis noted on 3/12 cath 3. HTN 4. Vasovagal symptoms 5. Hyperlipidemia: Myalgias with several statins, tolerating low dose Crestor.   SH: Married, lives with husband [name].  He is a Company secretary and she helps out at his church.  3 daughters.  Nonsmoker, no ETOH.   FH: Mother died at 88 with lung cancer, she thinks mother had an MI in her 60s.  Father had an MI in his 4s.    Current Outpatient Prescriptions   Medication Sig Dispense Refill  . amLODipine (NORVASC) 2.5 MG tablet Take 1 tablet (2.5 mg total) by mouth 2 (two) times daily.  180 tablet  3  . aspirin EC 81 MG tablet Take 81 mg by mouth daily.      . hydrochlorothiazide (HYDRODIURIL) 25 MG tablet TAKE 1 TABLET BY MOUTH DAILY.  30 tablet  3  . LOVAZA 1 G capsule TAKE 2 CAPSULES BY MOUTH ONCE DAILY  60 capsule  PRN  . metoprolol succinate (TOPROL-XL) 25 MG 24 hr tablet Take 1 tablet (25 mg total) by mouth daily.  30 tablet  3  . nitroGLYCERIN (NITROSTAT) 0.4 MG SL tablet Place 0.4 mg under the tongue every 5 (five) minutes as needed.      . rosuvastatin (CRESTOR) 10 MG tablet TAKE 0.5 TABLET BY MOUTH DAILY.      [name] 625 MG tablet TAKE 3 TABLETS BY MOUTH ONCE DAILY  90 tablet  3   No current facility-administered medications for this visit.    BP 142/70  Pulse 61  Ht 5\' 4"  (1.626 m)  Wt 84.823 kg (187 lb)  BMI 32.08 kg/m2 General: NAD Neck: No JVD, no thyromegaly or thyroid nodule.  Lungs: Clear to auscultation bilaterally with normal respiratory effort. CV: Nondisplaced PMI.  Heart regular S1/S2, no S3/S4, no murmur.  No peripheral edema.  No carotid bruit.  Normal pedal pulses.  Abdomen: Soft, nontender, no hepatosplenomegaly, no distention.  Neurologic: Alert and oriented x 3.  Psych: Normal affect. Extremities: No clubbing or cyanosis.   Assessment/Plan  Angina pectoris syndrome  Patient has had chronic chest pain with moderate to heavy exertion as well as rare episodes at rest. This pattern has been present for over 10 years. She had a normal cath in 3/02 and a normal cardiolite in 4/14. She is thought to have microvascular angina. She was unable to tolerate Imdur.  Amlodipine 2.5 mg daily is helping a lot, and she really has minimal symptoms now.  Hyperlipemia  Repeat lipids.   [name] 08/01/2013

## 2013-08-05 ENCOUNTER — Telehealth: Payer: Self-pay | Admitting: Cardiology

## 2013-08-05 NOTE — Telephone Encounter (Signed)
Spoke with patient about recent lab results 

## 2013-08-05 NOTE — Telephone Encounter (Signed)
New message  ° ° °Returning call back to nurse.  °

## 2013-09-01 ENCOUNTER — Other Ambulatory Visit: Payer: Self-pay | Admitting: Cardiology

## 2013-11-20 ENCOUNTER — Other Ambulatory Visit: Payer: Self-pay | Admitting: Cardiology

## 2013-11-20 ENCOUNTER — Other Ambulatory Visit (HOSPITAL_COMMUNITY): Payer: Self-pay | Admitting: Family Medicine

## 2013-11-20 DIAGNOSIS — R1011 Right upper quadrant pain: Secondary | ICD-10-CM

## 2013-11-20 DIAGNOSIS — R1012 Left upper quadrant pain: Secondary | ICD-10-CM

## 2013-11-26 ENCOUNTER — Ambulatory Visit (HOSPITAL_COMMUNITY)
Admission: RE | Admit: 2013-11-26 | Discharge: 2013-11-26 | Disposition: A | Discharge status: Home / Self Care | Payer: BC Managed Care – PPO | Source: Ambulatory Visit | Attending: Family Medicine | Admitting: Family Medicine

## 2013-11-26 ENCOUNTER — Other Ambulatory Visit (HOSPITAL_COMMUNITY): Payer: Self-pay | Admitting: Family Medicine

## 2013-11-26 DIAGNOSIS — K219 Gastro-esophageal reflux disease without esophagitis: Secondary | ICD-10-CM | POA: Diagnosis not present

## 2013-11-26 DIAGNOSIS — K449 Diaphragmatic hernia without obstruction or gangrene: Secondary | ICD-10-CM | POA: Diagnosis not present

## 2013-11-26 DIAGNOSIS — R1012 Left upper quadrant pain: Secondary | ICD-10-CM

## 2013-11-26 DIAGNOSIS — R1011 Right upper quadrant pain: Secondary | ICD-10-CM

## 2013-12-15 ENCOUNTER — Telehealth: Payer: Self-pay | Admitting: Cardiology

## 2013-12-15 NOTE — Telephone Encounter (Signed)
Pt states she had an episode of chest pain in August while taking care of her grandchildren in [place], similar to her previous symptoms.  Dr Aundra Dubin advised and states probably OK for surgery but suggested appt prior to surgery. Appt scheduled with Chelsea Primus 12/19/13 for office visit before gall bladder surgery, pt advised.

## 2013-12-15 NOTE — Telephone Encounter (Signed)
If no new symptoms since last appointment, may undergo surgery. Continue beta blocker peri-op.

## 2013-12-15 NOTE — Telephone Encounter (Signed)
New message     Pt need clearance for gallbladder surgery.  Please call.  They do not have a form to fax

## 2013-12-15 NOTE — Telephone Encounter (Signed)
Will forward to Dr Arvin Collard.  I was unable to get in touch with patient.

## 2013-12-19 ENCOUNTER — Encounter: Payer: Self-pay | Admitting: Physician Assistant

## 2013-12-19 ENCOUNTER — Ambulatory Visit (INDEPENDENT_AMBULATORY_CARE_PROVIDER_SITE_OTHER): Payer: BC Managed Care – PPO | Admitting: Physician Assistant

## 2013-12-19 VITALS — BP 138/80 | HR 49 | Ht 64.0 in | Wt 175.0 lb

## 2013-12-19 DIAGNOSIS — Z01810 Encounter for preprocedural cardiovascular examination: Secondary | ICD-10-CM

## 2013-12-19 DIAGNOSIS — I10 Essential (primary) hypertension: Secondary | ICD-10-CM

## 2013-12-19 DIAGNOSIS — R001 Bradycardia, unspecified: Secondary | ICD-10-CM

## 2013-12-19 DIAGNOSIS — E785 Hyperlipidemia, unspecified: Secondary | ICD-10-CM

## 2013-12-19 DIAGNOSIS — I209 Angina pectoris, unspecified: Secondary | ICD-10-CM

## 2013-12-19 MED ORDER — NITROGLYCERIN 0.4 MG SL SUBL: 0.4000 mg | SUBLINGUAL_TABLET | SUBLINGUAL | Status: DC | PRN

## 2013-12-19 MED ORDER — ROSUVASTATIN CALCIUM 5 MG PO TABS: 5.0000 mg | ORAL_TABLET | Freq: Every day | ORAL | Status: DC

## 2013-12-19 NOTE — Patient Instructions (Signed)
Your physician wants you to follow-up in:  6 months with Dr. Aundra Dubin.  You will receive a reminder letter in the mail two months in advance. If you don't receive a letter, please call our office to schedule the follow-up appointment.   Your physician has requested that you have an exercise stress myoview. For further information please visit [email or web address]. Please follow instruction sheet, as given.

## 2013-12-19 NOTE — Progress Notes (Signed)
Christina Office Note   Date:  12/19/2013   ID:  Cartina, Brousseau [birth year], MRN [id number]  PCP:  Chesley Noon, MD  Primary Cardiologist:  Dr. [name]     History of Present Illness: [name] is a 63 y.o. female with a hx of microvascular angina and hyperlipidemia.  She has had chest pain for over 10 years. She has had a pattern of tightness in her chest sometimes radiating to the left arm as well as dyspnea when walking up hills or if she carries a heavy load up steps. This tends to resolve if she takes nitroglycerin.     She has been unable to tolerate nitrates due to headaches. Ranexa was too expensive.  Last seen by Dr. Aundra Dubin 07/2013. Symptoms were improved on amlodipine.  The patient has had symptomatic cholelithiasis and needs cholecystectomy with Dr. Randolm Idol in [place]. Date is to be determined. She presents for surgical clearance. She was visiting her daughter in [place], who had just given birth, in August and had a severe episode of chest discomfort.  It was a stressful situation and she was rushing to get somewhere. She had severe left-sided chest discomfort with associated jaw and arm pain, dyspnea and diaphoresis. She took 3 nitroglycerin and her symptoms eventually resolved. She felt exhausted the next day. Otherwise, she has had fairly well controlled anginal symptoms with exertion on her current dose of amlodipine. She denies syncope, orthopnea, PND or edema.   Studies  - LHC (3/02):  Normal coronary arteries.  Left ventricular function was normal.  - ETT-Myoview (06/12/12): Low risk, apical thinning, no ischemia, EF 80%.   Labs: Labs (10/13): K 3.8, creatinine 0.8, LFTs normal  Labs (5/14):   K 3.5, Cr 0.78, Hgb 11.8    Wt Readings from Last 3 Encounters:  12/19/13 175 lb (79.379 kg)  07/30/13 187 lb (84.823 kg)  12/24/12 181 lb (82.101 kg)     Past Medical History: 1. Microvascular angina: Patient had left heart cath in 3/02 with  normal coronaries. She had a stress cardiolite in 7/10 that was normal. Unable to tolerate Imdur. Ranexa too expensive. ETT-Myoview 06/12/12: Low risk, apical thinning, no ischemia, EF 80% 2. Right renal artery stenosis noted on 3/02 cath  3. HTN  4. Vasovagal symptoms  5. Hyperlipidemia: Myalgias with several statins, tolerating low dose Crestor.   Current Outpatient Prescriptions  Medication Sig Dispense Refill  . amLODipine (NORVASC) 2.5 MG tablet Take 1 tablet (2.5 mg total) by mouth daily.  180 tablet  3  . aspirin EC 81 MG tablet Take 81 mg by mouth daily.      . hydrochlorothiazide (HYDRODIURIL) 25 MG tablet TAKE 1 TABLET BY MOUTH DAILY.  30 tablet  3  . LOVAZA 1 G capsule TAKE 2 CAPSULES BY MOUTH ONCE DAILY  60 capsule  PRN  . metoprolol succinate (TOPROL-XL) 25 MG 24 hr tablet TAKE 1 TABLET BY MOUTH DAILY.  30 tablet  3  . nitroGLYCERIN (NITROSTAT) 0.4 MG SL tablet Place 0.4 mg under the tongue every 5 (five) minutes as needed.      . rosuvastatin (CRESTOR) 10 MG tablet TAKE 0.5 TABLET BY MOUTH DAILY.      [name] 625 MG tablet TAKE 3 TABLETS BY MOUTH ONCE DAILY  90 tablet  3   No current facility-administered medications for this visit.    Allergies:   Allergies  Allergen Reactions  . Aspirin Diarrhea and Nausea Only    Can  take COATED a baby aspirin    Social History:  The patient  reports that she has never smoked. She has never used smokeless tobacco. She reports that she does not drink alcohol or use illicit drugs.   Family History:  The patient's family history includes Cancer in her mother; Coronary artery disease in an other family member; Diabetes in her maternal grandfather; Heart attack in her mother; Heart disease in her father; Hypertension in her father; Lung cancer in her mother; Stroke in her maternal grandfather.   ROS:  Please see HPI. She has noted occ dizziness. All other systems reviewed and are negative.   PHYSICAL EXAM: VS:  BP 138/80  Pulse 49   Ht 5\' 4"  (1.626 m)  Wt 175 lb (79.379 kg)  BMI 30.02 kg/m2 Well nourished, well developed, in no acute distress HEENT: normal Neck: no JVD Vascular:  No carotid bruits. Cardiac:  normal S1, S2; RRR; no murmur Lungs:  clear to auscultation bilaterally, no wheezing, rhonchi or rales Abd: soft, nontender, no hepatomegaly Ext: no edema Skin: warm and dry Neuro:  CNs 2-12 intact, no focal abnormalities noted  EKG:  Sinus brady, HR 49, normal axis, no change from prior tracing     ASSESSMENT AND PLAN:  1.  Preoperative cardiovascular examination:  She needs GB surgery for symptomatic cholelithiasis.  Anginal symptoms were fairly well controlled on Amlodipine.  However, she has had a recent episode of severe chest pain.  It has been > 12 mos since her last stress test.  I think it would be best to pursue risk stratification prior to proceeding with surgery under [facility].    -  Arrange ETT-Myoview. 2.  Angina pectoris syndrome:  As noted, proceed with stress testing.  Continue current dose of Amlodipine, beta blocker. We discussed how to take NTG for acute chest pain and when to go to the ED. 3.  Essential hypertension:  Controlled.  4.  Hyperlipemia:  Continue statin.  5.  Sinus bradycardia:  Asymptomatic.  Continue current dose of beta blocker.    Disposition:  FU with Dr. [name] in 6 mos or sooner if Myoview abnormal.    Signed, Versie Starks, MHS 12/19/2013 10:40 AM    [address], [place], [place]  [address] Phone: [phone]; Fax: [phone]

## 2013-12-22 ENCOUNTER — Ambulatory Visit (HOSPITAL_COMMUNITY): Payer: BC Managed Care – PPO | Attending: Cardiovascular Disease | Admitting: Radiology

## 2013-12-22 DIAGNOSIS — I209 Angina pectoris, unspecified: Secondary | ICD-10-CM

## 2013-12-22 DIAGNOSIS — R002 Palpitations: Secondary | ICD-10-CM | POA: Diagnosis not present

## 2013-12-22 DIAGNOSIS — R0609 Other forms of dyspnea: Secondary | ICD-10-CM | POA: Insufficient documentation

## 2013-12-22 DIAGNOSIS — R001 Bradycardia, unspecified: Secondary | ICD-10-CM

## 2013-12-22 DIAGNOSIS — I10 Essential (primary) hypertension: Secondary | ICD-10-CM | POA: Insufficient documentation

## 2013-12-22 DIAGNOSIS — R0789 Other chest pain: Secondary | ICD-10-CM | POA: Diagnosis not present

## 2013-12-22 MED ORDER — TECHNETIUM TC 99M SESTAMIBI GENERIC - CARDIOLITE
30.0000 | Freq: Once | INTRAVENOUS | Status: AC | PRN
  Administered 2013-12-22: 30 via INTRAVENOUS

## 2013-12-22 MED ORDER — TECHNETIUM TC 99M SESTAMIBI GENERIC - CARDIOLITE
10.0000 | Freq: Once | INTRAVENOUS | Status: AC | PRN
  Administered 2013-12-22: 10 via INTRAVENOUS

## 2013-12-22 NOTE — Progress Notes (Signed)
[name] [address] [place], [place] [address] [phone]    Cardiology Nuclear Med Study  [name] is a 63 y.o. female     MRN : [id number]     DOB: 05/01/1950  Procedure Date: 12/22/2013  Nuclear Med Background Indication for Stress Test:  Evaluation for Ischemia, and Pending Surgical Clearance for Cholecystectomy by Randolm Idol, MD Jule Ser) History:  '14 MPI: EF: 80% Low Risk  Cardiac Risk Factors: Hypertension  Symptoms:  Chest Tightness, Palpitations, and DOE   Nuclear Pre-Procedure Caffeine/Decaff Intake:  None> 12 hrs NPO After: 9:00pm   Lungs:  clear O2 Sat: 96% on room air. IV 0.9% NS with Angio Cath:  22g  IV Site: R Antecubital x 1, tolerated well IV Started by:  Irven Baltimore, RN  Chest Size (in):  38 Cup Size: D  Height: 5\' 4"  (1.626 m)  Weight:  176 lb (79.833 kg)  BMI:  Body mass index is 30.2 kg/(m^2). Tech Comments: No medications this am.  Patient held Toprol x 36 hrs. Irven Baltimore, RN.    Nuclear Med Study 1 or 2 day study: 1 day  Stress Test Type:  Stress  Reading MD: N/A  Order Authorizing Provider:  [name], MD, and Richardson Dopp, [facility]  Resting Radionuclide: Technetium 63m Sestamibi  Resting Radionuclide Dose: 11.0 mCi   Stress Radionuclide:  Technetium 9m Sestamibi  Stress Radionuclide Dose: 33.0 mCi           Stress Protocol Rest HR: 56 Stress HR: 137  Rest BP: 135/79 Stress BP: 198/75  Exercise Time (min): 7:00 METS: 8.50           Dose of Adenosine (mg):  n/a Dose of Lexiscan: n/a mg  Dose of Atropine (mg): n/a Dose of Dobutamine: n/a mcg/kg/min (at max HR)  Stress Test Technologist: Perrin Maltese, EMT-P  Nuclear Technologist:  Earl Many, CNMT     Rest Procedure:  Myocardial perfusion imaging was performed at rest 45 minutes following the intravenous administration of Technetium 30m Sestamibi. Rest ECG: NSR with non-specific ST-T wave changes  Stress Procedure:  The  patient exercised on the treadmill utilizing the Bruce Protocol for 7:00 minutes. The patient stopped due to fatigue, and 5/10 chest pain.  Technetium 70m Sestamibi was injected at peak exercise and myocardial perfusion imaging was performed after a brief delay. Stress ECG: Frequent PACs and atrial couplets during exercise. Sub-diagnostic (< 1 mm) horizontal ST depression is seen during recovery  QPS Raw Data Images:  Normal; no motion artifact; normal heart/lung ratio. Stress Images:  Normal homogeneous uptake in all areas of the myocardium. Rest Images:  Normal homogeneous uptake in all areas of the myocardium. Subtraction (SDS):  No evidence of ischemia. Transient Ischemic Dilatation (Normal <1.22):  0.84 Lung/Heart Ratio (Normal <0.45):  0.33  Quantitative Gated Spect Images QGS EDV:  69 ml QGS ESV:  18 ml  Impression Exercise Capacity:  Fair exercise capacity. Improved exercise time dompared to 2014. BP Response:  Normal blood pressure response. Clinical Symptoms:  No significant symptoms noted. ECG Impression:  No significant ST segment change suggestive of ischemia. Comparison with Prior Nuclear Study: No significant change from previous study  Overall Impression:  Normal stress nuclear study.  LV Ejection Fraction: 75%.  LV Wall Motion:  NL LV Function; NL Wall Motion   Sanda Klein, MD, [facility] [phone] office [phone] pager

## 2013-12-23 ENCOUNTER — Encounter: Payer: Self-pay | Admitting: Physician Assistant

## 2013-12-23 NOTE — Progress Notes (Signed)
ETT-Myoview (11/15):  Normal stress nuclear study. No ischemia.  LV Ejection Fraction: 75%  The patient does not require further cardiac testing prior to her noncardiac surgery. She may proceed at acceptable risk.  Signed,  Richardson Dopp, PA-C   12/23/2013 8:21 AM

## 2013-12-24 ENCOUNTER — Telehealth: Payer: Self-pay | Admitting: Cardiology

## 2013-12-24 NOTE — Telephone Encounter (Signed)
F/u    Pt stated to fax it to [facility] fax # [phone].

## 2013-12-24 NOTE — Telephone Encounter (Signed)
New problem   Pt need fax sent to [facility] fax # [phone] clearing her for surgery.

## 2013-12-24 NOTE — Telephone Encounter (Signed)
Please get the  physician's name and phone number  where she wants it faxed.  Thanks.

## 2013-12-24 NOTE — Telephone Encounter (Signed)
[facility] (medical records) can do this, they ask that we get the physician name and telephone number in addition to the fax number.  Thanks.

## 2013-12-25 NOTE — Telephone Encounter (Signed)
s/w pt in regards to metoprolol and migraine. I went over about migraines with pt advised that [place] did not feel the visual disturbance/ flashing lights was from metoprolol but felt more migrain related. Pt advised to f/u w/PCP. Pt said thank you.

## 2013-12-29 ENCOUNTER — Other Ambulatory Visit: Payer: Self-pay | Admitting: Cardiology

## 2014-02-05 ENCOUNTER — Ambulatory Visit: Payer: BC Managed Care – PPO | Admitting: Cardiology

## 2014-03-24 ENCOUNTER — Other Ambulatory Visit: Payer: Self-pay | Admitting: Cardiology

## 2014-05-26 ENCOUNTER — Other Ambulatory Visit: Payer: Self-pay

## 2014-05-26 DIAGNOSIS — Z1231 Encounter for screening mammogram for malignant neoplasm of breast: Secondary | ICD-10-CM

## 2014-06-04 ENCOUNTER — Ambulatory Visit
Admission: RE | Admit: 2014-06-04 | Discharge: 2014-06-04 | Disposition: A | Discharge status: Home / Self Care | Payer: BLUE CROSS/BLUE SHIELD | Source: Ambulatory Visit

## 2014-06-04 DIAGNOSIS — Z1231 Encounter for screening mammogram for malignant neoplasm of breast: Secondary | ICD-10-CM

## 2014-06-19 IMAGING — CR DG CHEST 2V
2 series · 2 of 2 positions shown · non-contrast
Comparison: 11/16/2011

CLINICAL DATA: Chest pain

CHEST - 2 VIEW

[w chest pa]
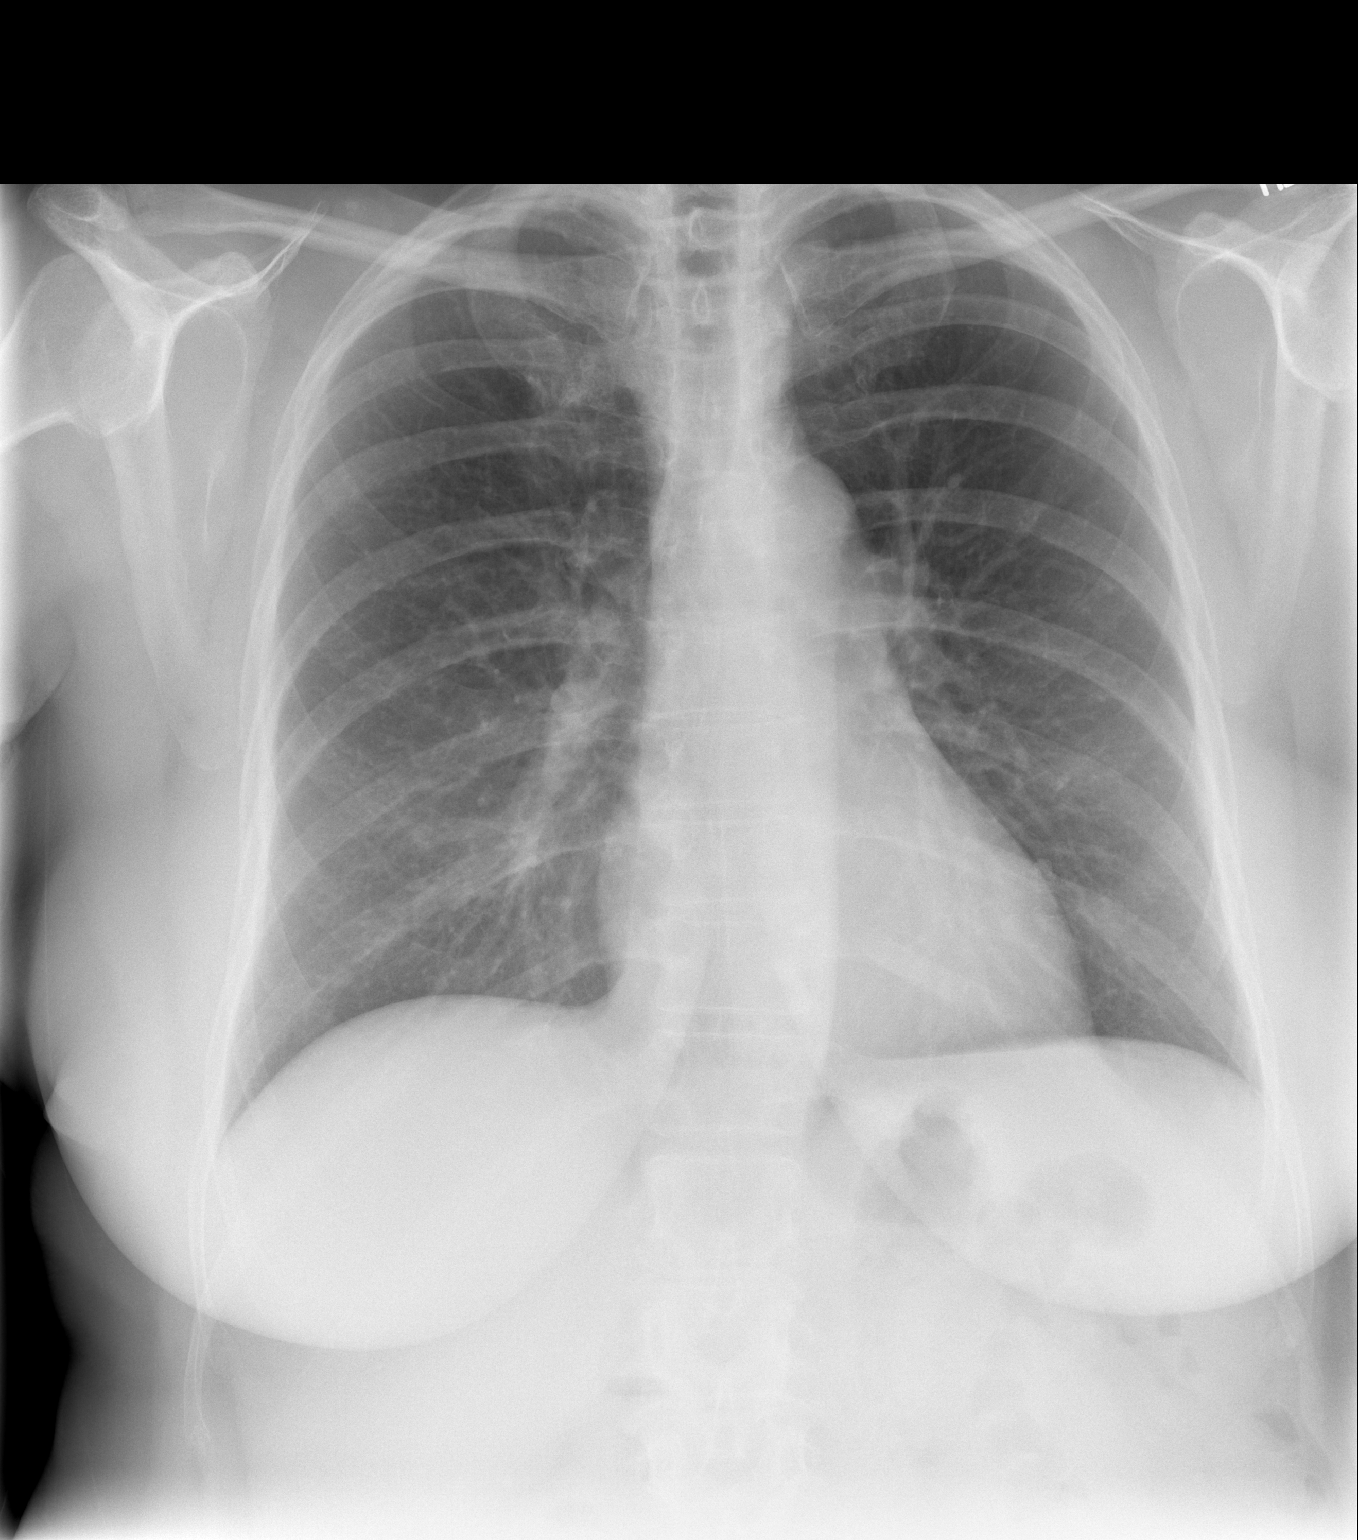

[w chest lat]
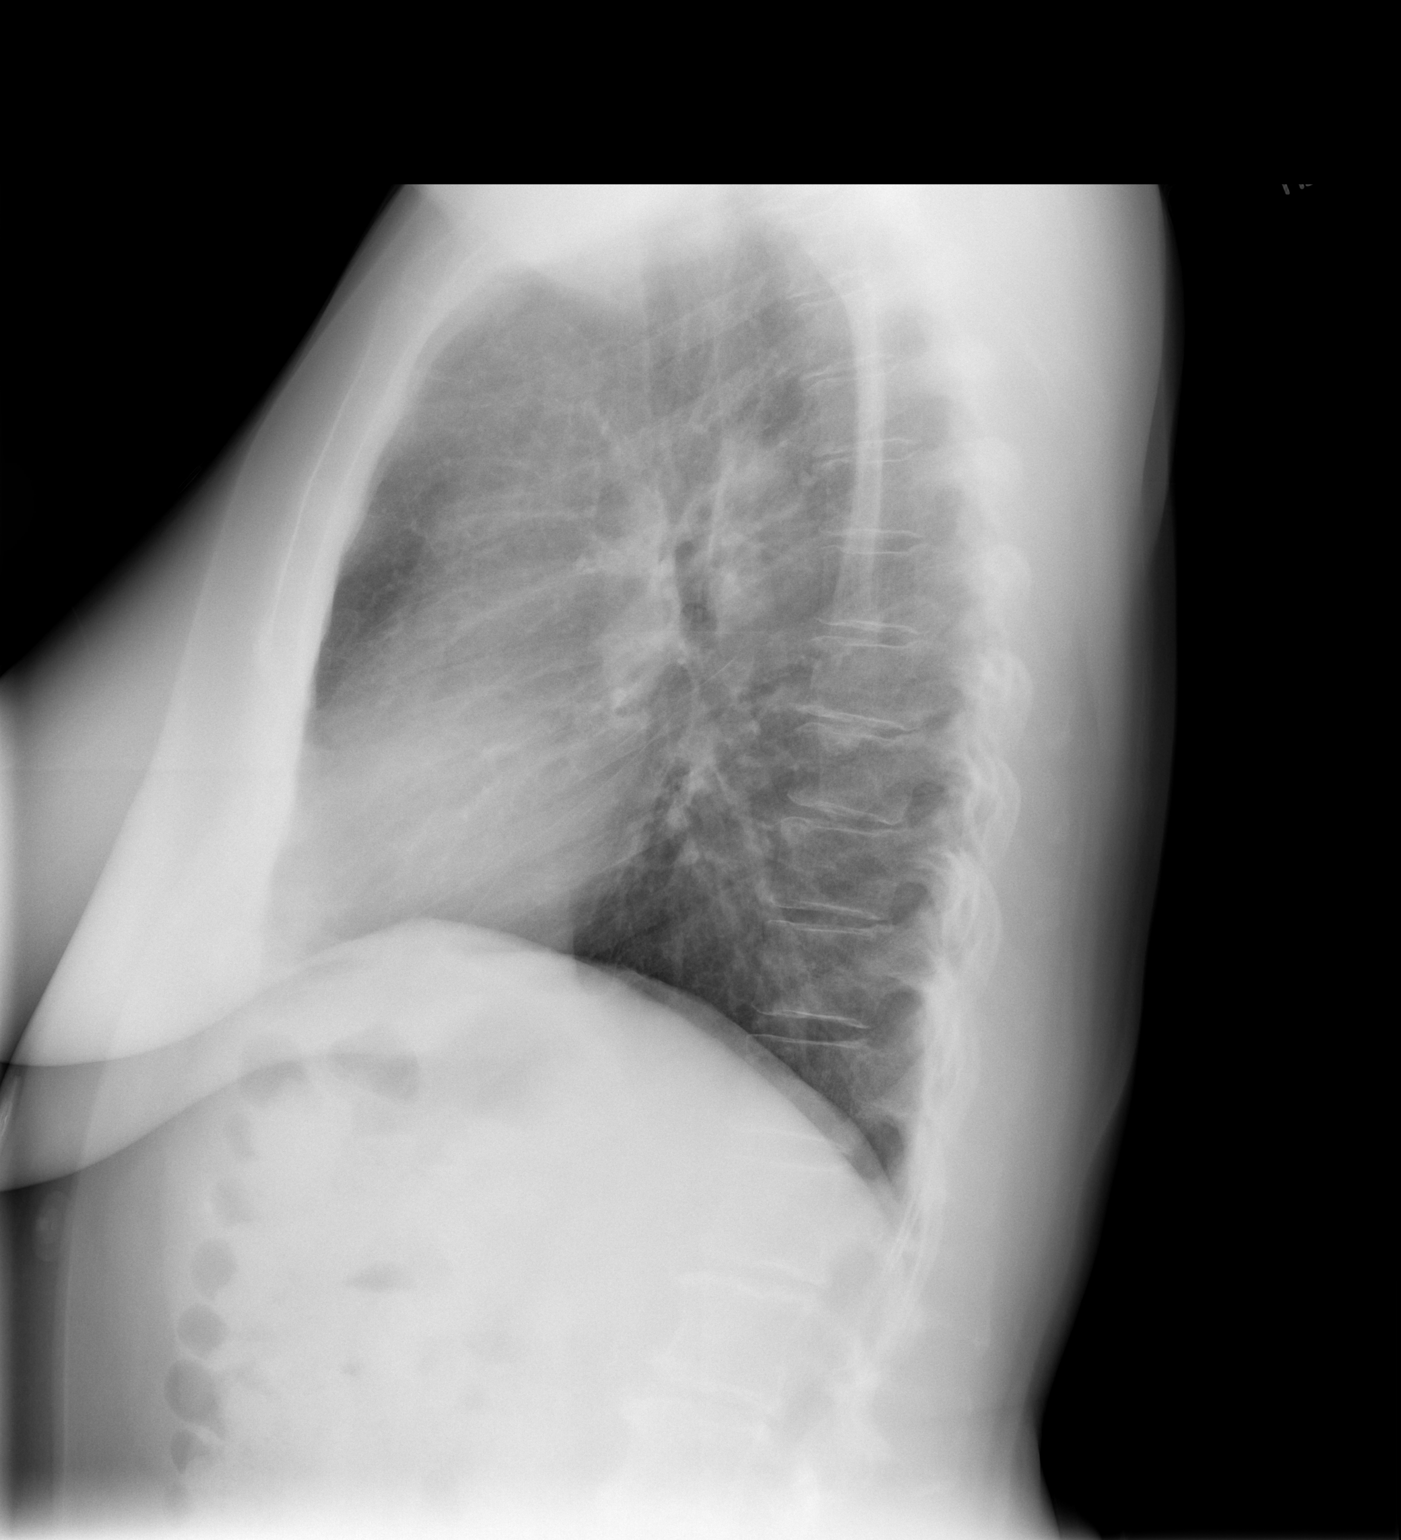

[2 of 2 positions shown; findings below may reference images not displayed]

FINDINGS: Lungs are clear. No pleural effusion or pneumothorax. The
cardiomediastinal contours are within normal limits. The visualized
bones and soft tissues are without significant appreciable
abnormality.
IMPRESSION: No radiographic evidence of acute cardiopulmonary process.

## 2014-07-24 ENCOUNTER — Other Ambulatory Visit: Payer: Self-pay | Admitting: Cardiology

## 2014-08-04 ENCOUNTER — Other Ambulatory Visit: Payer: Self-pay | Admitting: Cardiology

## 2014-08-26 ENCOUNTER — Other Ambulatory Visit: Payer: Self-pay | Admitting: Cardiology

## 2014-09-02 ENCOUNTER — Other Ambulatory Visit: Payer: Self-pay | Admitting: Cardiology

## 2014-10-02 ENCOUNTER — Other Ambulatory Visit: Payer: Self-pay

## 2014-10-02 MED ORDER — AMLODIPINE BESYLATE 2.5 MG PO TABS: 2.5000 mg | ORAL_TABLET | Freq: Every day | ORAL | Status: DC

## 2014-10-06 NOTE — Progress Notes (Signed)
Cardiology Office Note   Date:  10/07/2014   ID:  [name], DOB [birth year], MRN [id number]  PCP:  Chesley Noon, MD  Cardiologist:  Dr. [name]   Electrophysiologist:  n/a  Chief Complaint  Patient presents with  . Follow-up    Microvascular Angina     History of Present Illness: [name] is a 64 y.o. female with a hx of microvascular angina and hyperlipidemia. She has had chest pain for over 10 years. She has had a pattern of tightness in her chest sometimes radiating to the left arm as well as dyspnea when walking up hills or if she carries a heavy load up steps. This tends to resolve if she takes nitroglycerin.She has been unable to tolerate nitrates due to headaches. Ranexa was too expensive. Symptoms have improved on amlodipine.  She underwent laparoscopic cholecystectomy in [facility] 12/2013.  Nuclear stress test prior to her surgery was normal. She returns for follow-up.  She is here alone.  She is doing well.  She had no issues with her cholecystectomy.  She denies dyspnea. She has stable chest discomfort with over-exertion.  She denies syncope.  She denies orthopnea, PND, edema.  She does note occasional weakness or dizziness that occurs suddenly.  She has no assoc symptoms. She does not think that she is near syncopal with these symptoms.     Studies - LHC (3/02): Normal coronary arteries. Left ventricular function was normal. - ETT-Myoview (06/12/12): Low risk, apical thinning, no ischemia, EF 80%.  - ETT-Myoview (11/15):  Normal stress nuclear study.  LV Ejection Fraction: 75%.   Past Medical History  Diagnosis Date  . Hypertension   . Hypercholesteremia   . Syncope 2008    syncopal episode, probably related to excessive stress, activity, and fatigue      . 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 Shortness of breath   . Edema of lower extremity   . Fatigue   . Sinus problem   . History of cardiovascular stress  test 09/09/2008    EF - 72%  /  normal nuclear study  . Hx of cardiovascular stress test     ETT-Myoview (11/15):  Normal stress nuclear study. No ischemia.  LV Ejection Fraction: 75%  1. Microvascular angina: Patient had left heart cath in 3/02 with normal coronaries. She had a stress cardiolite in 7/10 that was normal. Unable to tolerate Imdur. Ranexa too expensive. ETT-Myoview 06/12/12: Low risk, apical thinning, no ischemia, EF 80% 2. Right renal artery stenosis noted on 3/02 cath  3. HTN  4. Vasovagal symptoms  5. Hyperlipidemia: Myalgias with several statins, tolerating low dose Crestor.   Past Surgical History  Procedure Laterality Date  . Cardiac catheterization  2002    normal  . Cardiac catheterization       Current Outpatient Prescriptions  Medication Sig Dispense Refill  . amLODipine (NORVASC) 2.5 MG tablet Take 1 tablet (2.5 mg total) by mouth daily. 30 tablet 0  . aspirin EC 81 MG tablet Take 81 mg by mouth daily.    . hydrochlorothiazide (HYDRODIURIL) 25 MG tablet TAKE 1 TABLET BY MOUTH DAILY. 30 tablet 3  . metoprolol succinate (TOPROL-XL) 25 MG 24 hr tablet Take 0.5 tablets (12.5 mg total) by mouth daily.    . nitroGLYCERIN (NITROSTAT) 0.4 MG SL tablet Place 1 tablet (0.4 mg total) under the tongue every 5 (five) minutes as needed. 25 tablet 6  . omega-3 acid ethyl esters (  LOVAZA) 1 G capsule TAKE 2 CAPSULES BY MOUTH ONCE DAILY 60 capsule 1  . rosuvastatin (CRESTOR) 5 MG tablet Take 1 tablet (5 mg total) by mouth daily. 30 tablet 11   No current facility-administered medications for this visit.    Allergies:   Aspirin    Social History:  The patient  reports that she has never smoked. She has never used smokeless tobacco. She reports that she does not drink alcohol or use illicit drugs.   Family History:  The patient's family history includes Cancer in her mother; Coronary artery disease in an other family member; Diabetes in her maternal grandfather; Heart  attack in her mother; Heart disease in her father; Hypertension in her father; Lung cancer in her mother; Stroke in her maternal grandfather.    ROS:   Please see the history of present illness.   Review of Systems  Eyes: Positive for visual disturbance.  Neurological: Positive for dizziness.  All other systems reviewed and are negative.     PHYSICAL EXAM: VS:  BP 122/78 mmHg  Pulse 56  Ht 5\' 4"  (1.626 m)  Wt 181 lb (82.101 kg)  BMI 31.05 kg/m2    Wt Readings from Last 3 Encounters:  10/07/14 181 lb (82.101 kg)  12/22/13 176 lb (79.833 kg)  12/19/13 175 lb (79.379 kg)     GEN: Well nourished, well developed, in no acute distress HEENT: normal Neck: no JVD, no carotid bruits, no masses Cardiac:  Normal S1/S2, RRR; no murmur ,  no rubs or gallops, no edema   Respiratory:  clear to auscultation bilaterally, no wheezing, rhonchi or rales. GI: soft, nontender, nondistended, + BS MS: no deformity or atrophy Skin: warm and dry  Neuro:  CNs II-XII intact, Strength and sensation are intact Psych: Normal affect   EKG:  EKG is ordered today.  It demonstrates:   Sinus brady, HR 56, normal axis, no change from prior tracing   Recent Labs: No results found for requested labs within last 365 days.    Lipid Panel    Component Value Date/Time   CHOL 164 07/30/2013 1639   TRIG 235.0* 07/30/2013 1639   HDL 51.30 07/30/2013 1639   CHOLHDL 3 07/30/2013 1639   VLDL 47.0* 07/30/2013 1639   LDLCALC 66 07/30/2013 1639   LDLDIRECT 102.3 10/17/2011 0941      ASSESSMENT AND PLAN:  Angina pectoris syndrome:  Symptoms overall stable.  Continue amlodipine, beta blocker. She was intolerant to nitrates in the past secondary to headache. Ranolazine was too expensive.  Essential hypertension:  Controlled.  Hyperlipemia:  Continue statin, Lovaza. Her insurance no longer covers WelChol and she stopped this around January. Check lipids and LFTs today. We can decide on further adjustments in  therapy depending on her lipid levels. Of note, she has not tolerated higher doses of statin therapy in the past.  Sinus bradycardia:  She notes recent episodes of weakness. I will obtain a BMET, CBC, TSH. She is bradycardic. Heart rates have been lower in the past. I will decrease her Toprol to 12.5 mg daily. If she continues to have weak spells, I will consider stopping her beta blocker and setting her up with an event monitor. She will contact us if her blood pressures start to increase or her chest pain symptoms start to change with adjustments in her therapy.    Medication Changes: Current medicines are reviewed at length with the patient today.  Concerns regarding medicines are as outlined above.  The following  changes have been made:   Discontinued Medications   WELCHOL 625 MG TABLET    TAKE 3 TABLETS BY MOUTH ONCE DAILY   Modified Medications   Modified Medication Previous Medication   METOPROLOL SUCCINATE (TOPROL-XL) 25 MG 24 HR TABLET metoprolol succinate (TOPROL-XL) 25 MG 24 hr tablet      Take 0.5 tablets (12.5 mg total) by mouth daily.    TAKE 1 TABLET BY MOUTH DAILY.   New Prescriptions   No medications on file    Labs/ tests ordered today include:   Orders Placed This Encounter  Procedures  . Basic Metabolic Panel (BMET)  . CBC w/Diff  . TSH  . Hepatic function panel  . Lipid Profile  . EKG 12-Lead     Disposition:   FU with Dr. [name] or me 6 mos.    Signed, Versie Starks, MHS 10/07/2014 10:50 AM    [facility] [address], [place], [place]  [address] Phone: [phone]; Fax: [phone]

## 2014-10-07 ENCOUNTER — Encounter: Payer: Self-pay | Admitting: Physician Assistant

## 2014-10-07 ENCOUNTER — Ambulatory Visit (INDEPENDENT_AMBULATORY_CARE_PROVIDER_SITE_OTHER): Payer: BLUE CROSS/BLUE SHIELD | Admitting: Physician Assistant

## 2014-10-07 VITALS — BP 122/78 | HR 56 | Ht 64.0 in | Wt 181.0 lb

## 2014-10-07 DIAGNOSIS — I209 Angina pectoris, unspecified: Secondary | ICD-10-CM

## 2014-10-07 DIAGNOSIS — R001 Bradycardia, unspecified: Secondary | ICD-10-CM

## 2014-10-07 DIAGNOSIS — E785 Hyperlipidemia, unspecified: Secondary | ICD-10-CM | POA: Diagnosis not present

## 2014-10-07 DIAGNOSIS — I10 Essential (primary) hypertension: Secondary | ICD-10-CM | POA: Diagnosis not present

## 2014-10-07 DIAGNOSIS — R531 Weakness: Secondary | ICD-10-CM

## 2014-10-07 LAB — CBC WITH DIFFERENTIAL/PLATELET
BASOS ABS: 0 10*3/uL (ref 0.0–0.1)
BASOS PCT: 0.4 % (ref 0.0–3.0)
Eosinophils Absolute: 0.2 10*3/uL (ref 0.0–0.7)
Eosinophils Relative: 4.8 % (ref 0.0–5.0)
HEMATOCRIT: 39.1 % (ref 36.0–46.0)
Hemoglobin: 13.3 g/dL (ref 12.0–15.0)
LYMPHS ABS: 2.1 10*3/uL (ref 0.7–4.0)
LYMPHS PCT: 40.9 % (ref 12.0–46.0)
MCHC: 34.1 g/dL (ref 30.0–36.0)
MCV: 87 fl (ref 78.0–100.0)
Monocytes Absolute: 0.4 10*3/uL (ref 0.1–1.0)
Monocytes Relative: 7.4 % (ref 3.0–12.0)
NEUTROS ABS: 2.4 10*3/uL (ref 1.4–7.7)
NEUTROS PCT: 46.5 % (ref 43.0–77.0)
PLATELETS: 183 10*3/uL (ref 150.0–400.0)
RBC: 4.49 Mil/uL (ref 3.87–5.11)
RDW: 13.3 % (ref 11.5–15.5)
WBC: 5.1 10*3/uL (ref 4.0–10.5)

## 2014-10-07 LAB — BASIC METABOLIC PANEL
BUN: 12 mg/dL (ref 6–23)
CALCIUM: 9.6 mg/dL (ref 8.4–10.5)
CHLORIDE: 103 meq/L (ref 96–112)
CO2: 30 meq/L (ref 19–32)
Creatinine, Ser: 0.82 mg/dL (ref 0.40–1.20)
GFR: 74.71 mL/min (ref >60.00)
GLUCOSE: 86 mg/dL (ref 70–99)
Potassium: 3.4 mEq/L — ABNORMAL LOW (ref 3.5–5.1)
Sodium: 141 mEq/L (ref 135–145)

## 2014-10-07 LAB — HEPATIC FUNCTION PANEL
ALT: 25 U/L (ref 0–35)
AST: 27 U/L (ref 0–37)
Albumin: 4.4 g/dL (ref 3.5–5.2)
Alkaline Phosphatase: 41 U/L (ref 39–117)
Bilirubin, Direct: 0.1 mg/dL (ref 0.0–0.3)
Total Bilirubin: 0.6 mg/dL (ref 0.2–1.2)
Total Protein: 7.2 g/dL (ref 6.0–8.3)

## 2014-10-07 LAB — LIPID PANEL
Cholesterol: 177 mg/dL (ref 0–200)
HDL: 53.3 mg/dL (ref >39.00)
LDL Cholesterol: 98 mg/dL (ref 0–99)
NonHDL: 123.4
TRIGLYCERIDES: 129 mg/dL (ref 0.0–149.0)
Total CHOL/HDL Ratio: 3
VLDL: 25.8 mg/dL (ref 0.0–40.0)

## 2014-10-07 LAB — TSH: TSH: 1.83 u[IU]/mL (ref 0.35–4.50)

## 2014-10-07 MED ORDER — METOPROLOL SUCCINATE ER 25 MG PO TB24: 12.5000 mg | ORAL_TABLET | Freq: Every day | ORAL | Status: DC

## 2014-10-07 NOTE — Patient Instructions (Signed)
Medication Instructions:  1. STOP WELCHOL  2. DECREASE TOPROL XL TO 12.5 MG DAILY = 1/2 TAB DAILY  Labwork: TODAY BMET, FASTING LIPID AND LIVER PANEL, TSH, CBC W/DIFF  Testing/Procedures: NONE  Follow-Up: Your physician wants you to follow-up in: [place] DR. Aundra Dubin You will receive a reminder letter in the mail two months in advance. If you don't receive a letter, please call our office to schedule the follow-up appointment.   Any Other Special Instructions Will Be Listed Below (If Applicable). CALL IF WEAK SPELLS CONTINUE OR IF INCREASE IN CHEST PAIN OR IF INCREASE IN BLOOD PRESSURE

## 2014-10-08 ENCOUNTER — Telehealth: Payer: Self-pay | Admitting: *Deleted

## 2014-10-08 DIAGNOSIS — I10 Essential (primary) hypertension: Secondary | ICD-10-CM

## 2014-10-08 MED ORDER — POTASSIUM CHLORIDE CRYS ER 20 MEQ PO TBCR: 20.0000 meq | EXTENDED_RELEASE_TABLET | Freq: Every day | ORAL | Status: DC

## 2014-10-08 NOTE — Telephone Encounter (Signed)
Pt notified of lab results. Pt agreeable to starting K+ 20 meq daily, Rx sent in to [facility]. BMET 8/26.Marland Kitchen

## 2014-10-16 ENCOUNTER — Telehealth: Payer: Self-pay | Admitting: *Deleted

## 2014-10-16 ENCOUNTER — Other Ambulatory Visit (INDEPENDENT_AMBULATORY_CARE_PROVIDER_SITE_OTHER): Payer: BLUE CROSS/BLUE SHIELD

## 2014-10-16 DIAGNOSIS — I10 Essential (primary) hypertension: Secondary | ICD-10-CM | POA: Diagnosis not present

## 2014-10-16 LAB — BASIC METABOLIC PANEL
BUN: 8 mg/dL (ref 6–23)
CALCIUM: 9.6 mg/dL (ref 8.4–10.5)
CHLORIDE: 103 meq/L (ref 96–112)
CO2: 29 meq/L (ref 19–32)
Creatinine, Ser: 0.81 mg/dL (ref 0.40–1.20)
GFR: 75.77 mL/min (ref >60.00)
Glucose, Bld: 86 mg/dL (ref 70–99)
Potassium: 4 mEq/L (ref 3.5–5.1)
SODIUM: 141 meq/L (ref 135–145)

## 2014-10-16 NOTE — Telephone Encounter (Signed)
Pt notified of lab results by phone with verbal understanding.  

## 2014-11-04 ENCOUNTER — Other Ambulatory Visit: Payer: Self-pay | Admitting: Cardiology

## 2015-01-04 ENCOUNTER — Other Ambulatory Visit: Payer: Self-pay | Admitting: Physician Assistant

## 2015-01-04 ENCOUNTER — Other Ambulatory Visit: Payer: Self-pay | Admitting: Cardiology

## 2015-01-05 ENCOUNTER — Other Ambulatory Visit: Payer: Self-pay | Admitting: *Deleted

## 2015-01-05 MED ORDER — HYDROCHLOROTHIAZIDE 25 MG PO TABS: 25.0000 mg | ORAL_TABLET | Freq: Every day | ORAL | Status: DC

## 2015-01-05 MED ORDER — ROSUVASTATIN CALCIUM 5 MG PO TABS: 5.0000 mg | ORAL_TABLET | Freq: Every day | ORAL | Status: DC

## 2015-02-05 ENCOUNTER — Other Ambulatory Visit: Payer: Self-pay | Admitting: Cardiology

## 2015-03-10 MED FILL — ROSUVASTATIN CALCIUM 5 MG T: 5 | 30 days supply | Qty: 30 | Fill #2

## 2015-03-10 MED FILL — AMLODIPINE BESYLATE 2.5 MG: 2.5 | 30 days supply | Qty: 30 | Fill #4

## 2015-03-10 MED FILL — HYDROCHLOROTHIAZIDE 25 MG T: 25 | 30 days supply | Qty: 30 | Fill #2

## 2015-03-23 MED FILL — OMEGA-3 ETHYL ESTERS 1 GM C: 1 | 30 days supply | Qty: 60 | Fill #4

## 2015-04-05 MED FILL — METOPROLOL SUCC ER 25 MG TA: 25 | 30 days supply | Qty: 15 | Fill #1

## 2015-04-05 MED FILL — HYDROCHLOROTHIAZIDE 25 MG T: 25 | 30 days supply | Qty: 30 | Fill #3

## 2015-04-05 MED FILL — ROSUVASTATIN CALCIUM 5 MG T: 5 | 30 days supply | Qty: 30 | Fill #3

## 2015-04-05 MED FILL — AMLODIPINE BESYLATE 2.5 MG: 2.5 | 30 days supply | Qty: 30 | Fill #5

## 2015-04-08 ENCOUNTER — Ambulatory Visit: Payer: BLUE CROSS/BLUE SHIELD | Admitting: Physician Assistant

## 2015-04-14 NOTE — Progress Notes (Signed)
Cardiology Office Note:    Date:  04/15/2015   ID:  [name], DOB 01/22/1951, MRN [id number]  PCP:  Chesley Noon, MD  Cardiologist:  Dr. [name]   Electrophysiologist:  n/a  Chief Complaint  Patient presents with  . Chest Pain    FU for Microvascular Angina    History of Present Illness:     [name] is a 65 y.o. female with a hx of microvascular angina and hyperlipidemia. She has had chest pain for over 10 years. She has had a pattern of tightness in her chest sometimes radiating to the left arm as well as dyspnea when walking up hills or if she carries a heavy load up steps. This tends to resolve if she takes nitroglycerin.She has been unable to tolerate nitrates due to headaches. Ranexa was too expensive. Symptoms have improved on amlodipine. She underwent laparoscopic cholecystectomy in [facility] 12/2013. Nuclear stress test prior to her surgery was normal. Last seen 8/16 by me. She complained of some dizzy spells. Given bradycardia, I decreased her Toprol.  Returns for follow-up. Overall doing well. She continues to have some anginal symptoms if she overdoes it. These symptoms are stable. She denies significant changes in her dyspnea with exertion. She denies orthopnea, PND. She has mild pedal edema. Denies syncope. She's had no further episodes of lightheadedness since decreasing her dose of beta blocker.   Past Medical History  Diagnosis Date  . Hypertension   . Hypercholesteremia   . Syncope 2008    syncopal episode, probably related to excessive stress, activity, and fatigue      . 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 Shortness of breath   . Edema of lower extremity   . Fatigue   . Sinus problem   . History of cardiovascular stress test 09/09/2008    EF - 72%  /  normal nuclear study  . Hx of cardiovascular stress test     ETT-Myoview (11/15):  Normal stress nuclear study. No ischemia.  LV Ejection Fraction: 75%  1.  Microvascular angina: Patient had left heart cath in 3/02 with normal coronaries. She had a stress cardiolite in 7/10 that was normal. Unable to tolerate Imdur. Ranexa too expensive. ETT-Myoview 06/12/12: Low risk, apical thinning, no ischemia, EF 80% 2. Right renal artery stenosis noted on 3/02 cath  3. HTN  4. Vasovagal symptoms  5. Hyperlipidemia: Myalgias with several statins, tolerating low dose Crestor.  Past Surgical History  Procedure Laterality Date  . Cardiac catheterization  2002    normal  . Cardiac catheterization      Current Medications: Outpatient Prescriptions Prior to Visit  Medication Sig Dispense Refill  . amLODipine (NORVASC) 2.5 MG tablet TAKE 1 TABLET BY MOUTH ONCE DAILY 30 tablet 5  . aspirin EC 81 MG tablet Take 81 mg by mouth daily.    . hydrochlorothiazide (HYDRODIURIL) 25 MG tablet Take 1 tablet (25 mg total) by mouth daily. 30 tablet 3  . metoprolol succinate (TOPROL-XL) 25 MG 24 hr tablet Take 0.5 tablets (12.5 mg total) by mouth daily. 15 tablet 5  . nitroGLYCERIN (NITROSTAT) 0.4 MG SL tablet Place 1 tablet (0.4 mg total) under the tongue every 5 (five) minutes as needed. 25 tablet 6  . omega-3 acid ethyl esters (LOVAZA) 1 G capsule TAKE 2 CAPSULES BY MOUTH ONCE DAILY 60 capsule 5  . potassium chloride SA (KLOR-CON M20) 20 MEQ tablet Take 1 tablet (20 mEq total) by mouth  daily. 90 tablet 3  . rosuvastatin (CRESTOR) 5 MG tablet Take 1 tablet (5 mg total) by mouth daily. 30 tablet 3   No facility-administered medications prior to visit.     Allergies:   Aspirin   Social History   Social History  . Marital Status: Married    Spouse Name: N/A  . Number of Children: N/A  . Years of Education: N/A   Social History Main Topics  . Smoking status: Never Smoker   . Smokeless tobacco: Never Used  . Alcohol Use: No  . Drug Use: No  . Sexual Activity: Not Asked   Other Topics Concern  . None   Social History Narrative     Family History:  The  patient's family history includes Cancer in her mother; Diabetes in her maternal grandfather; Heart attack in her mother; Heart disease in her father; Hypertension in her father; Lung cancer in her mother; Stroke in her maternal grandfather.   ROS:   Please see the history of present illness.    ROS All other systems reviewed and are negative.   Physical Exam:    VS:  BP 122/82 mmHg  Pulse 64  Ht 5\' 4"  (1.626 m)  Wt 181 lb (82.101 kg)  BMI 31.05 kg/m2  SpO2 96%   GEN: Well nourished, well developed, in no acute distress HEENT: normal Neck: no JVD, no masses Cardiac: Normal S1/S2, RRR; no murmurs,  no edema;  no carotid bruits,   Respiratory:  clear to auscultation bilaterally; no wheezing, rhonchi or rales GI: soft, nontender  MS: no deformity or atrophy Skin: warm and dry,   Neuro:  no focal deficits  Psych: Alert and oriented x 3, normal affect  Wt Readings from Last 3 Encounters:  04/15/15 181 lb (82.101 kg)  10/07/14 181 lb (82.101 kg)  12/22/13 176 lb (79.833 kg)      Studies/Labs Reviewed:     EKG:  EKG is  ordered today.  The ekg ordered today demonstrates NSR, HR 61, normal axis, nonspecific ST-T wave changes, QTc 455 ms, no significant change when compared to prior tracings  Recent Labs: 10/07/2014: ALT 25; Hemoglobin 13.3; Platelets 183.0; TSH 1.83 10/16/2014: BUN 8; Creatinine, Ser 0.81; Potassium 4.0; Sodium 141   Recent Lipid Panel    Component Value Date/Time   CHOL 177 10/07/2014 1055   TRIG 129.0 10/07/2014 1055   HDL 53.30 10/07/2014 1055   CHOLHDL 3 10/07/2014 1055   VLDL 25.8 10/07/2014 1055   LDLCALC 98 10/07/2014 1055   LDLDIRECT 102.3 10/17/2011 0941    Additional studies/ records that were reviewed today include:   - LHC (3/02): Normal coronary arteries. Left ventricular function was normal. - ETT-Myoview (06/12/12): Low risk, apical thinning, no ischemia, EF 80%. - ETT-Myoview (11/15): Normal stress nuclear study. LV Ejection  Fraction: 75%.  ASSESSMENT:     1. Angina pectoris syndrome ([place])   2. HLD (hyperlipidemia)     PLAN:     In order of problems listed above:  1. Angina pectoris syndrome: Symptoms overall stable. Continue amlodipine, beta blocker.  At last visit, she had some bradycardia and was likely symptomatic with this. This is overall improved on lower dose beta blocker. She was intolerant to nitrates in the past secondary to headache. Ranolazine was too expensive.  BP remains optimal.   2. Hyperlipemia: Recent lipid panel optimal. Continue statin, Lovaza. Obtain lipids, LFTs prior to next visit in 1 year.    Medication Adjustments/Labs and Tests Ordered: Current  medicines are reviewed at length with the patient today.  Concerns regarding medicines are outlined above.  Medication changes, Labs and Tests ordered today are outlined in the Patient Instructions noted below. Patient Instructions  Medication Instructions:  Your physician recommends that you continue on your current medications as directed. Please refer to the Current Medication list given to you today.  Labwork: COME IN FOR FASTING LIPID AND LIVER PANEL AND BMET; TO BE DONE 1 WEEK BEFORE YOU SEE DR. MCLEAN IN 1 YEAR  Testing/Procedures: NONE  Follow-Up: Your physician wants you to follow-up in: 1 YEAR WITH DR. Aundra Dubin You will receive a reminder letter in the mail two months in advance. If you don't receive a letter, please call our office to schedule the follow-up appointment.  Any Other Special Instructions Will Be Listed Below (If Applicable).   If you need a refill on your cardiac medications before your next appointment, please call your pharmacy.   Signed, Richardson Dopp, PA-C  04/15/2015 5:08 PM    [facility] [address], [place], [place]  [address] Phone: [phone]; Fax: [phone]

## 2015-04-15 ENCOUNTER — Ambulatory Visit (INDEPENDENT_AMBULATORY_CARE_PROVIDER_SITE_OTHER): Payer: BLUE CROSS/BLUE SHIELD | Admitting: Physician Assistant

## 2015-04-15 ENCOUNTER — Encounter: Payer: Self-pay | Admitting: Physician Assistant

## 2015-04-15 VITALS — BP 122/82 | HR 64 | Ht 64.0 in | Wt 181.0 lb

## 2015-04-15 DIAGNOSIS — I209 Angina pectoris, unspecified: Secondary | ICD-10-CM

## 2015-04-15 DIAGNOSIS — E785 Hyperlipidemia, unspecified: Secondary | ICD-10-CM

## 2015-04-15 NOTE — Patient Instructions (Addendum)
Medication Instructions:  Your physician recommends that you continue on your current medications as directed. Please refer to the Current Medication list given to you today.  Labwork: COME IN FOR FASTING LIPID AND LIVER PANEL AND BMET; TO BE DONE 1 WEEK BEFORE YOU SEE DR. MCLEAN IN 1 YEAR  Testing/Procedures: NONE  Follow-Up: Your physician wants you to follow-up in: 1 YEAR WITH DR. Aundra Dubin You will receive a reminder letter in the mail two months in advance. If you don't receive a letter, please call our office to schedule the follow-up appointment.  Any Other Special Instructions Will Be Listed Below (If Applicable).   If you need a refill on your cardiac medications before your next appointment, please call your pharmacy.

## 2015-05-07 ENCOUNTER — Other Ambulatory Visit: Payer: Self-pay | Admitting: Cardiology

## 2015-05-07 MED FILL — ROSUVASTATIN CALCIUM 5 MG T: 5 | 30 days supply | Qty: 30 | Fill #0

## 2015-05-07 MED FILL — AMLODIPINE BESYLATE 2.5 MG: 2.5 | 30 days supply | Qty: 30 | Fill #0

## 2015-05-10 ENCOUNTER — Other Ambulatory Visit: Payer: Self-pay | Admitting: Cardiology

## 2015-05-10 MED FILL — HYDROCHLOROTHIAZIDE 25 MG T: 25 | 30 days supply | Qty: 30 | Fill #0

## 2015-05-12 MED FILL — METOPROLOL SUCC ER 25 MG TA: 25 | 30 days supply | Qty: 15 | Fill #2

## 2015-05-14 MED FILL — OMEGA-3 ETHYL ESTERS 1 GM C: 1 | 30 days supply | Qty: 60 | Fill #5

## 2015-06-08 MED FILL — ROSUVASTATIN CALCIUM 5 MG T: 5 | 30 days supply | Qty: 30 | Fill #1

## 2015-06-08 MED FILL — AMLODIPINE BESYLATE 2.5 MG: 2.5 | 30 days supply | Qty: 30 | Fill #1

## 2015-06-14 ENCOUNTER — Other Ambulatory Visit: Payer: Self-pay | Admitting: Cardiology

## 2015-06-14 MED FILL — METOPROLOL SUCC ER 25 MG TA: 25 | 30 days supply | Qty: 15 | Fill #3

## 2015-06-14 MED FILL — HYDROCHLOROTHIAZIDE 25 MG T: 25 | 30 days supply | Qty: 30 | Fill #1

## 2015-06-15 MED FILL — OMEGA-3 ETHYL ESTERS 1 GM C: 1 | 30 days supply | Qty: 60 | Fill #0

## 2015-06-21 ENCOUNTER — Other Ambulatory Visit: Payer: Self-pay | Admitting: *Deleted

## 2015-06-21 MED ORDER — NITROGLYCERIN 0.4 MG SL SUBL: 0.4000 mg | SUBLINGUAL_TABLET | SUBLINGUAL | Status: DC | PRN

## 2015-06-21 MED FILL — NITROGLYCERIN 0.4 MG TAB SL: 0.4 | 13 days supply | Qty: 25 | Fill #0

## 2015-06-24 ENCOUNTER — Encounter: Payer: Self-pay | Admitting: *Deleted

## 2015-06-24 ENCOUNTER — Encounter: Payer: Self-pay | Admitting: Physician Assistant

## 2015-06-24 ENCOUNTER — Ambulatory Visit (INDEPENDENT_AMBULATORY_CARE_PROVIDER_SITE_OTHER): Payer: BLUE CROSS/BLUE SHIELD | Admitting: Physician Assistant

## 2015-06-24 VITALS — BP 160/82 | HR 66 | Ht 64.0 in | Wt 180.1 lb

## 2015-06-24 DIAGNOSIS — I10 Essential (primary) hypertension: Secondary | ICD-10-CM

## 2015-06-24 DIAGNOSIS — I200 Unstable angina: Secondary | ICD-10-CM | POA: Diagnosis not present

## 2015-06-24 DIAGNOSIS — E785 Hyperlipidemia, unspecified: Secondary | ICD-10-CM

## 2015-06-24 DIAGNOSIS — I209 Angina pectoris, unspecified: Secondary | ICD-10-CM

## 2015-06-24 LAB — CBC WITH DIFFERENTIAL/PLATELET
Basophils Absolute: 64 cells/uL (ref 0–200)
Basophils Relative: 1 %
EOS PCT: 4 %
Eosinophils Absolute: 256 cells/uL (ref 15–500)
HCT: 39.2 % (ref 35.0–45.0)
HEMOGLOBIN: 13.3 g/dL (ref 11.7–15.5)
LYMPHS ABS: 2688 {cells}/uL (ref 850–3900)
Lymphocytes Relative: 42 %
MCH: 29.6 pg (ref 27.0–33.0)
MCHC: 33.9 g/dL (ref 32.0–36.0)
MCV: 87.3 fL (ref 80.0–100.0)
MONOS PCT: 7 %
MPV: 10.8 fL (ref 7.5–12.5)
Monocytes Absolute: 448 cells/uL (ref 200–950)
NEUTROS ABS: 2944 {cells}/uL (ref 1500–7800)
NEUTROS PCT: 46 %
Platelets: 230 10*3/uL (ref 140–400)
RBC: 4.49 MIL/uL (ref 3.80–5.10)
RDW: 13.7 % (ref 11.0–15.0)
WBC: 6.4 10*3/uL (ref 3.8–10.8)

## 2015-06-24 LAB — BASIC METABOLIC PANEL
BUN: 12 mg/dL (ref 7–25)
CALCIUM: 9.5 mg/dL (ref 8.6–10.4)
CO2: 32 mmol/L — ABNORMAL HIGH (ref 20–31)
CREATININE: 0.81 mg/dL (ref 0.50–0.99)
Chloride: 100 mmol/L (ref 98–110)
GLUCOSE: 95 mg/dL (ref 65–99)
Potassium: 3.8 mmol/L (ref 3.5–5.3)
SODIUM: 141 mmol/L (ref 135–146)

## 2015-06-24 LAB — PROTIME-INR
INR: 0.86 (ref <1.50)
Prothrombin Time: 11.8 seconds (ref 11.6–15.2)

## 2015-06-24 MED ORDER — AMLODIPINE BESYLATE 5 MG PO TABS: 5.0000 mg | ORAL_TABLET | Freq: Every day | ORAL | Status: DC

## 2015-06-24 MED FILL — AMLODIPINE BESYLATE 5 MG TA: 5 | 90 days supply | Qty: 90 | Fill #0

## 2015-06-24 NOTE — Progress Notes (Signed)
Cardiology Office Note:    Date:  06/24/2015   ID:  [name], DOB 1950/04/06, MRN [id number]  PCP:  Christina Noon, MD  Cardiologist:  Dr. [name]   Electrophysiologist:  n/a  Referring MD: Christina Noon, MD   Chief Complaint  Patient presents with  . Chest Pain    History of Present Illness:     [name] is a 65 y.o. female with a hx of microvascular angina and hyperlipidemia. She has had chest pain for over 10 years. She has had a pattern of tightness in her chest sometimes radiating to the left arm as well as dyspnea when walking up hills or if she carries a heavy load up steps. This tends to resolve if she takes nitroglycerin.She has been unable to tolerate nitrates due to headaches. Ranexa was too expensive. Symptoms have improved on amlodipine. She underwent laparoscopic cholecystectomy in [facility] 12/2013. Nuclear stress test prior to her surgery was normal.  She has a hx of dizziness and bradycardia that improved on lower dose beta-blocker.  Last seen by me 03/2015.    Her grandson plays on the same baseball team as my son.  She returns today for evaluation of worsening chest pain.  She had an episode a little over a week ago.  She had been out of her beta-blocker for a couple days.  She had not had an episode for over a year.  She had assoc dyspnea, diaphoresis, L arm and jaw pain.  She took NTG with relief.  She had another episode several days later.  This was the worst one she has had.  She sat in the ED parking lot for an hour.  It finally subsided and she went home without checking in.  She has felt fatigued and continues to have an "awareness" in her chest since.  This is typical for her after these episodes.  Her typical exertional chest pain has also been a little worse over the past few days.     Past Medical History  Diagnosis Date  . Hypertension   . Hypercholesteremia   . Syncope 2008    syncopal episode, probably related to  excessive stress, activity, and fatigue      . 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 Shortness of breath   . Edema of lower extremity   . Fatigue   . Sinus problem   . History of cardiovascular stress test 09/09/2008    EF - 72%  /  normal nuclear study  . Hx of cardiovascular stress test     ETT-Myoview (11/15):  Normal stress nuclear study. No ischemia.  LV Ejection Fraction: 75%    Past Surgical History  Procedure Laterality Date  . Cardiac catheterization  2002    normal  . Cardiac catheterization      Current Medications: Outpatient Prescriptions Prior to Visit  Medication Sig Dispense Refill  . aspirin EC 81 MG tablet Take 81 mg by mouth daily.    . hydrochlorothiazide (HYDRODIURIL) 25 MG tablet TAKE 1 TABLET BY MOUTH ONCE DAILY 30 tablet 11  . metoprolol succinate (TOPROL-XL) 25 MG 24 hr tablet Take 0.5 tablets (12.5 mg total) by mouth daily. 15 tablet 5  . nitroGLYCERIN (NITROSTAT) 0.4 MG SL tablet Place 1 tablet (0.4 mg total) under the tongue every 5 (five) minutes as needed. 25 tablet 6  . omega-3 acid ethyl esters (LOVAZA) 1 g capsule TAKE 2 CAPSULES BY MOUTH ONCE DAILY  60 capsule 9  . potassium chloride SA (KLOR-CON M20) 20 MEQ tablet Take 1 tablet (20 mEq total) by mouth daily. (Patient taking differently: Take 20 mEq by mouth daily as needed (for leg and arm cramps). ) 90 tablet 3  . rosuvastatin (CRESTOR) 5 MG tablet TAKE 1 TABLET BY MOUTH ONCE DAILY 30 tablet 10  . amLODipine (NORVASC) 2.5 MG tablet TAKE 1 TABLET BY MOUTH ONCE DAILY 30 tablet 10   No facility-administered medications prior to visit.      Allergies:   Aspirin; Lactose intolerance (gi); Potassium-containing compounds; and Adhesive   Social History   Social History  . Marital Status: Married    Spouse Name: N/A  . Number of Children: N/A  . Years of Education: N/A   Social History Main Topics  . Smoking status: Never Smoker   . Smokeless tobacco: Never Used  . Alcohol Use:  No  . Drug Use: No  . Sexual Activity: Not Asked   Other Topics Concern  . None   Social History Narrative     Family History:  The patient's family history includes Cancer in her mother; Diabetes in her maternal grandfather; Heart attack in her mother; Heart disease in her father; Hypertension in her father; Lung cancer in her mother; Stroke in her maternal grandfather.   ROS:   Please see the history of present illness.    Review of Systems  Constitution: Positive for malaise/fatigue.  Cardiovascular: Positive for chest pain and dyspnea on exertion.  Respiratory: Positive for cough.   Musculoskeletal: Positive for back pain.  Gastrointestinal: Positive for abdominal pain.   All other systems reviewed and are negative.   Physical Exam:    VS:  BP 160/82 mmHg  Pulse 66  Ht 5\' 4"  (1.626 m)  Wt 180 lb 1.9 oz (81.702 kg)  BMI 30.90 kg/m2  SpO2 95%   GEN: Well nourished, well developed, in no acute distress HEENT: normal Neck: no JVD, no masses Cardiac: Normal S1/S2, RRR; no murmurs, rubs, or gallops, no edema;     Respiratory:  clear to auscultation bilaterally; no wheezing, rhonchi or rales GI: soft, nontender, nondistended MS: no deformity or atrophy Skin: warm and dry Neuro: No focal deficits  Psych: Alert and oriented x 3, normal affect  Wt Readings from Last 3 Encounters:  06/24/15 180 lb 1.9 oz (81.702 kg)  04/15/15 181 lb (82.101 kg)  10/07/14 181 lb (82.101 kg)      Studies/Labs Reviewed:     EKG:  EKG is  ordered today.  The ekg ordered today demonstrates NSR, HR 66, normal axis, no acute changes, QTc 446 ms, no change from prior tracing  Recent Labs: 10/07/2014: ALT 25; Hemoglobin 13.3; Platelets 183.0; TSH 1.83 10/16/2014: BUN 8; Creatinine, Ser 0.81; Potassium 4.0; Sodium 141   Recent Lipid Panel    Component Value Date/Time   CHOL 177 10/07/2014 1055   TRIG 129.0 10/07/2014 1055   HDL 53.30 10/07/2014 1055   CHOLHDL 3 10/07/2014 1055   VLDL 25.8  10/07/2014 1055   LDLCALC 98 10/07/2014 1055   LDLDIRECT 102.3 10/17/2011 0941    Additional studies/ records that were reviewed today include:   - LHC (3/02): Normal coronary arteries. Left ventricular function was normal. - ETT-Myoview (06/12/12): Low risk, apical thinning, no ischemia, EF 80%. - ETT-Myoview (11/15): Normal stress nuclear study. LV Ejection Fraction: 75%.   ASSESSMENT:     1. Unstable angina ([place])   2. HLD (hyperlipidemia)   3. Essential  hypertension     PLAN:     In order of problems listed above:   1. Unstable Angina - She has a hx of microvascular angina with stable symptoms over the years.  She last had a heart cath in 2002.  Last myoview in 2015.  She now presents with worsening episodes of chest discomfort.  ECG is unchanged.  She has a strong FHx of CAD.  I have rec proceeding with cardiac cath to further evaluate. I reviewed this with Dr. Aundra Dubin by phone who agreed.  Risks and benefits of cardiac catheterization have been discussed with the patient.  These include bleeding, infection, kidney damage, stroke, heart attack, death.  The patient understands these risks and is willing to proceed.   -  Arrange LHC  -  Increase Amlodipine to 5 mg QD  -  Go to ED if worsening symptoms  -  Continue ASA, Toprol, Crestor.    2. Hyperlipemia: Recent lipid panel optimal. Continue statin, Lovaza.   3. HTN - BP elevated.  Increase Amlodipine as noted.    Medication Adjustments/Labs and Tests Ordered: Current medicines are reviewed at length with the patient today.  Concerns regarding medicines are outlined above.  Medication changes, Labs and Tests ordered today are outlined in the Patient Instructions noted below. Patient Instructions  Medication Instructions:  1. INCREASE AMLODIPINE TO 5 MG DAILY; NEW RX SENT IN Labwork: 1. TODAY BMET, CBC W/DIFF, PT/INR Testing/Procedures: Your physician has requested that you have a cardiac catheterization. Cardiac  catheterization is used to diagnose and/or treat various heart conditions. Doctors may recommend this procedure for a number of different reasons. The most common reason is to evaluate chest pain. Chest pain can be a symptom of coronary artery disease (CAD), and cardiac catheterization can show whether plaque is narrowing or blocking your heart's arteries. This procedure is also used to evaluate the valves, as well as measure the blood flow and oxygen levels in different parts of your heart. For further information please visit HugeFiesta.[email or web address]. Please follow instruction sheet, as given. Follow-Up: 07/12/15 @ 10:10 WITH Keyia Moretto, PAC  Any Other Special Instructions Will Be Listed Below (If Applicable). YOU HAVE BEEN INSTRUCTED TO GO TO THE ED IF YOUR CHEST PAIN GETS WORSE BEFORE YOUR HEART CATH ON 06/29/15 If you need a refill on your cardiac medications before your next appointment, please call your pharmacy.     Signed, Christina Dopp, PA-C  06/24/2015 4:42 PM    [facility] [address], [place], [place]  [address] Phone: [phone]; Fax: [phone]

## 2015-06-24 NOTE — Patient Instructions (Addendum)
Medication Instructions:  1. INCREASE AMLODIPINE TO 5 MG DAILY; NEW RX SENT IN Labwork: 1. TODAY BMET, CBC W/DIFF, PT/INR Testing/Procedures: Your physician has requested that you have a cardiac catheterization. Cardiac catheterization is used to diagnose and/or treat various heart conditions. Doctors may recommend this procedure for a number of different reasons. The most common reason is to evaluate chest pain. Chest pain can be a symptom of coronary artery disease (CAD), and cardiac catheterization can show whether plaque is narrowing or blocking your heart's arteries. This procedure is also used to evaluate the valves, as well as measure the blood flow and oxygen levels in different parts of your heart. For further information please visit HugeFiesta.[email or web address]. Please follow instruction sheet, as given. Follow-Up: 07/12/15 @ 10:10 WITH SCOTT WEAVER, PAC  Any Other Special Instructions Will Be Listed Below (If Applicable). YOU HAVE BEEN INSTRUCTED TO GO TO THE ED IF YOUR CHEST PAIN GETS WORSE BEFORE YOUR HEART CATH ON 06/29/15 If you need a refill on your cardiac medications before your next appointment, please call your pharmacy.

## 2015-06-25 ENCOUNTER — Telehealth: Payer: Self-pay | Admitting: *Deleted

## 2015-06-25 NOTE — Telephone Encounter (Signed)
Lmtcb to go over lab results 

## 2015-06-25 NOTE — Telephone Encounter (Signed)
Follow up ° °Pt returned call  °

## 2015-06-25 NOTE — Telephone Encounter (Signed)
Pt has been notified of lab results by phone with verbal understanding. 

## 2015-06-29 ENCOUNTER — Encounter (HOSPITAL_COMMUNITY): Payer: Self-pay | Admitting: Cardiology

## 2015-06-29 ENCOUNTER — Ambulatory Visit (HOSPITAL_COMMUNITY)
Admission: RE | Admit: 2015-06-29 | Discharge: 2015-06-29 | Disposition: A | Discharge status: Home / Self Care | Payer: BLUE CROSS/BLUE SHIELD | Source: Ambulatory Visit | Attending: Cardiology | Admitting: Cardiology

## 2015-06-29 ENCOUNTER — Encounter (HOSPITAL_COMMUNITY)
Admission: RE | Disposition: A | Discharge status: Home / Self Care | Payer: Self-pay | Source: Ambulatory Visit | Attending: Cardiology

## 2015-06-29 DIAGNOSIS — I509 Heart failure, unspecified: Secondary | ICD-10-CM | POA: Insufficient documentation

## 2015-06-29 DIAGNOSIS — E7800 Pure hypercholesterolemia, unspecified: Secondary | ICD-10-CM | POA: Diagnosis not present

## 2015-06-29 DIAGNOSIS — Z7982 Long term (current) use of aspirin: Secondary | ICD-10-CM | POA: Diagnosis not present

## 2015-06-29 DIAGNOSIS — R079 Chest pain, unspecified: Secondary | ICD-10-CM

## 2015-06-29 DIAGNOSIS — I110 Hypertensive heart disease with heart failure: Secondary | ICD-10-CM | POA: Diagnosis not present

## 2015-06-29 DIAGNOSIS — I200 Unstable angina: Secondary | ICD-10-CM | POA: Diagnosis not present

## 2015-06-29 DIAGNOSIS — Z8249 Family history of ischemic heart disease and other diseases of the circulatory system: Secondary | ICD-10-CM | POA: Insufficient documentation

## 2015-06-29 DIAGNOSIS — E785 Hyperlipidemia, unspecified: Secondary | ICD-10-CM | POA: Diagnosis not present

## 2015-06-29 DIAGNOSIS — R51 Headache: Secondary | ICD-10-CM | POA: Insufficient documentation

## 2015-06-29 HISTORY — PX: CARDIAC CATHETERIZATION: SHX172

## 2015-06-29 SURGERY — LEFT HEART CATH AND CORONARY ANGIOGRAPHY
Anesthesia: LOCAL

## 2015-06-29 MED ORDER — ACETAMINOPHEN 325 MG PO TABS: 650.0000 mg | ORAL_TABLET | ORAL | Status: DC | PRN

## 2015-06-29 MED ORDER — LIDOCAINE HCL (PF) 1 % IJ SOLN
INTRAMUSCULAR | Status: DC | PRN
  Administered 2015-06-29: 2 mL via INTRADERMAL

## 2015-06-29 MED ORDER — SODIUM CHLORIDE 0.9 % IV SOLN
INTRAVENOUS | Status: DC
  Administered 2015-06-29 (×2): via INTRAVENOUS

## 2015-06-29 MED ORDER — HEPARIN SODIUM (PORCINE) 1000 UNIT/ML IJ SOLN
INTRAMUSCULAR | Status: DC | PRN
  Administered 2015-06-29: 4000 [IU] via INTRAVENOUS

## 2015-06-29 MED ORDER — SODIUM CHLORIDE 0.9% FLUSH: 3.0000 mL | INTRAVENOUS | Status: DC | PRN

## 2015-06-29 MED ORDER — IOPAMIDOL (ISOVUE-370) INJECTION 76%
INTRAVENOUS | Status: DC | PRN
  Administered 2015-06-29: 50 mL via INTRAVENOUS

## 2015-06-29 MED ORDER — NITROGLYCERIN 1 MG/10 ML FOR IR/CATH LAB
INTRA_ARTERIAL | Status: AC
  Filled 2015-06-29: qty 10

## 2015-06-29 MED ORDER — VERAPAMIL HCL 2.5 MG/ML IV SOLN
INTRAVENOUS | Status: AC
  Filled 2015-06-29: qty 2

## 2015-06-29 MED ORDER — ASPIRIN 81 MG PO CHEW: 81.0000 mg | CHEWABLE_TABLET | ORAL | Status: DC

## 2015-06-29 MED ORDER — MIDAZOLAM HCL 2 MG/2ML IJ SOLN
INTRAMUSCULAR | Status: AC
  Filled 2015-06-29: qty 2

## 2015-06-29 MED ORDER — HEPARIN (PORCINE) IN NACL 2-0.9 UNIT/ML-% IJ SOLN
INTRAMUSCULAR | Status: AC
  Filled 2015-06-29: qty 1000

## 2015-06-29 MED ORDER — HEPARIN SODIUM (PORCINE) 1000 UNIT/ML IJ SOLN
INTRAMUSCULAR | Status: AC
  Filled 2015-06-29: qty 1

## 2015-06-29 MED ORDER — FENTANYL CITRATE (PF) 100 MCG/2ML IJ SOLN
INTRAMUSCULAR | Status: AC
  Filled 2015-06-29: qty 2

## 2015-06-29 MED ORDER — SODIUM CHLORIDE 0.9% FLUSH: 3.0000 mL | Freq: Two times a day (BID) | INTRAVENOUS | Status: DC

## 2015-06-29 MED ORDER — SODIUM CHLORIDE 0.9 % WEIGHT BASED INFUSION: 3.0000 mL/kg/h | INTRAVENOUS | Status: DC

## 2015-06-29 MED ORDER — ONDANSETRON HCL 4 MG/2ML IJ SOLN: 4.0000 mg | Freq: Four times a day (QID) | INTRAMUSCULAR | Status: DC | PRN

## 2015-06-29 MED ORDER — FENTANYL CITRATE (PF) 100 MCG/2ML IJ SOLN
INTRAMUSCULAR | Status: DC | PRN
  Administered 2015-06-29 (×2): 25 ug via INTRAVENOUS

## 2015-06-29 MED ORDER — SODIUM CHLORIDE 0.9 % IV SOLN: 250.0000 mL | INTRAVENOUS | Status: DC | PRN

## 2015-06-29 MED ORDER — VERAPAMIL HCL 2.5 MG/ML IV SOLN
INTRAVENOUS | Status: DC | PRN
  Administered 2015-06-29: 10 mL via INTRA_ARTERIAL

## 2015-06-29 MED ORDER — LIDOCAINE HCL (PF) 1 % IJ SOLN
INTRAMUSCULAR | Status: AC
  Filled 2015-06-29: qty 30

## 2015-06-29 MED ORDER — MIDAZOLAM HCL 2 MG/2ML IJ SOLN
INTRAMUSCULAR | Status: DC | PRN
  Administered 2015-06-29: 1 mg via INTRAVENOUS

## 2015-06-29 SURGICAL SUPPLY — 11 items

## 2015-06-29 NOTE — Discharge Instructions (Signed)
Radial Site Care °Refer to this sheet in the next few weeks. These instructions provide you with information about caring for yourself after your procedure. Your health care provider may also give you more specific instructions. Your treatment has been planned according to current medical practices, but problems sometimes occur. Call your health care provider if you have any problems or questions after your procedure. °WHAT TO EXPECT AFTER THE PROCEDURE °After your procedure, it is typical to have the following: °· Bruising at the radial site that usually fades within 1-2 weeks. °· Blood collecting in the tissue (hematoma) that may be painful to the touch. It should usually decrease in size and tenderness within 1-2 weeks. °HOME CARE INSTRUCTIONS °· Take medicines only as directed by your health care provider. °· You may shower 24-48 hours after the procedure or as directed by your health care provider. Remove the bandage (dressing) and gently wash the site with plain soap and water. Pat the area dry with a clean towel. Do not rub the site, because this may cause bleeding. °· Do not take baths, swim, or use a hot tub until your health care provider approves. °· Check your insertion site every day for redness, swelling, or drainage. °· Do not apply powder or lotion to the site. °· Do not flex or bend the affected arm for 24 hours or as directed by your health care provider. °· Do not push or pull heavy objects with the affected arm for 24 hours or as directed by your health care provider. °· Do not lift over 10 lb (4.5 kg) for 5 days after your procedure or as directed by your health care provider. °· Ask your health care provider when it is okay to: °¨ Return to work or school. °¨ Resume usual physical activities or sports. °¨ Resume sexual activity. °· Do not drive home if you are discharged the same day as the procedure. Have someone else drive you. °· You may drive 24 hours after the procedure unless otherwise  instructed by your health care provider. °· Do not operate machinery or power tools for 24 hours after the procedure. °· If your procedure was done as an outpatient procedure, which means that you went home the same day as your procedure, a responsible adult should be with you for the first 24 hours after you arrive home. °· Keep all follow-up visits as directed by your health care provider. This is important. °SEEK MEDICAL CARE IF: °· You have a fever. °· You have chills. °· You have increased bleeding from the radial site. Hold pressure on the site. °SEEK IMMEDIATE MEDICAL CARE IF: °· You have unusual pain at the radial site. °· You have redness, warmth, or swelling at the radial site. °· You have drainage (other than a small amount of blood on the dressing) from the radial site. °· The radial site is bleeding, and the bleeding does not stop after 30 minutes of holding steady pressure on the site. °· Your arm or hand becomes pale, cool, tingly, or numb. °  °This information is not intended to replace advice given to you by your health care provider. Make sure you discuss any questions you have with your health care provider. °  °Document Released: 03/11/2010 Document Revised: 02/27/2014 Document Reviewed: 08/25/2013 °Elsevier Interactive Patient Education ©2016 [employer] Inc[employer] ° °

## 2015-06-29 NOTE — Interval H&P Note (Signed)
Cath Lab Visit (complete for each Cath Lab visit)  Clinical Evaluation Leading to the Procedure:   ACS: No.  Non-ACS:    Anginal Classification: CCS III  Anti-ischemic medical therapy: Maximal Therapy (2 or more classes of medications)  Non-Invasive Test Results: No non-invasive testing performed  Prior CABG: No previous CABG      History and Physical Interval Note:  06/29/2015 7:48 AM  [name]  has presented today for surgery, with the diagnosis of chf  The various methods of treatment have been discussed with the patient and family. After consideration of risks, benefits and other options for treatment, the patient has consented to  Procedure(s): Left Heart Cath and Coronary Angiography (N/A) as a surgical intervention .  The patient's history has been reviewed, patient examined, no change in status, stable for surgery.  I have reviewed the patient's chart and labs.  Questions were answered to the patient's satisfaction.     [employer]

## 2015-06-29 NOTE — H&P (View-Only) (Signed)
Cardiology Office Note:    Date:  06/24/2015   ID:  [name], DOB 1950/04/06, MRN [id number]  PCP:  Chesley Noon, MD  Cardiologist:  Dr. [name]   Electrophysiologist:  n/a  Referring MD: Chesley Noon, MD   Chief Complaint  Patient presents with  . Chest Pain    History of Present Illness:     [name] is a 65 y.o. female with a hx of microvascular angina and hyperlipidemia. She has had chest pain for over 10 years. She has had a pattern of tightness in her chest sometimes radiating to the left arm as well as dyspnea when walking up hills or if she carries a heavy load up steps. This tends to resolve if she takes nitroglycerin.She has been unable to tolerate nitrates due to headaches. Ranexa was too expensive. Symptoms have improved on amlodipine. She underwent laparoscopic cholecystectomy in [facility] 12/2013. Nuclear stress test prior to her surgery was normal.  She has a hx of dizziness and bradycardia that improved on lower dose beta-blocker.  Last seen by me 03/2015.    Her grandson plays on the same baseball team as my son.  She returns today for evaluation of worsening chest pain.  She had an episode a little over a week ago.  She had been out of her beta-blocker for a couple days.  She had not had an episode for over a year.  She had assoc dyspnea, diaphoresis, L arm and jaw pain.  She took NTG with relief.  She had another episode several days later.  This was the worst one she has had.  She sat in the ED parking lot for an hour.  It finally subsided and she went home without checking in.  She has felt fatigued and continues to have an "awareness" in her chest since.  This is typical for her after these episodes.  Her typical exertional chest pain has also been a little worse over the past few days.     Past Medical History  Diagnosis Date  . Hypertension   . Hypercholesteremia   . Syncope 2008    syncopal episode, probably related to  excessive stress, activity, and fatigue      . 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 Shortness of breath   . Edema of lower extremity   . Fatigue   . Sinus problem   . History of cardiovascular stress test 09/09/2008    EF - 72%  /  normal nuclear study  . Hx of cardiovascular stress test     ETT-Myoview (11/15):  Normal stress nuclear study. No ischemia.  LV Ejection Fraction: 75%    Past Surgical History  Procedure Laterality Date  . Cardiac catheterization  2002    normal  . Cardiac catheterization      Current Medications: Outpatient Prescriptions Prior to Visit  Medication Sig Dispense Refill  . aspirin EC 81 MG tablet Take 81 mg by mouth daily.    . hydrochlorothiazide (HYDRODIURIL) 25 MG tablet TAKE 1 TABLET BY MOUTH ONCE DAILY 30 tablet 11  . metoprolol succinate (TOPROL-XL) 25 MG 24 hr tablet Take 0.5 tablets (12.5 mg total) by mouth daily. 15 tablet 5  . nitroGLYCERIN (NITROSTAT) 0.4 MG SL tablet Place 1 tablet (0.4 mg total) under the tongue every 5 (five) minutes as needed. 25 tablet 6  . omega-3 acid ethyl esters (LOVAZA) 1 g capsule TAKE 2 CAPSULES BY MOUTH ONCE DAILY  60 capsule 9  . potassium chloride SA (KLOR-CON M20) 20 MEQ tablet Take 1 tablet (20 mEq total) by mouth daily. (Patient taking differently: Take 20 mEq by mouth daily as needed (for leg and arm cramps). ) 90 tablet 3  . rosuvastatin (CRESTOR) 5 MG tablet TAKE 1 TABLET BY MOUTH ONCE DAILY 30 tablet 10  . amLODipine (NORVASC) 2.5 MG tablet TAKE 1 TABLET BY MOUTH ONCE DAILY 30 tablet 10   No facility-administered medications prior to visit.      Allergies:   Aspirin; Lactose intolerance (gi); Potassium-containing compounds; and Adhesive   Social History   Social History  . Marital Status: Married    Spouse Name: N/A  . Number of Children: N/A  . Years of Education: N/A   Social History Main Topics  . Smoking status: Never Smoker   . Smokeless tobacco: Never Used  . Alcohol Use:  No  . Drug Use: No  . Sexual Activity: Not Asked   Other Topics Concern  . None   Social History Narrative     Family History:  The patient's family history includes Cancer in her mother; Diabetes in her maternal grandfather; Heart attack in her mother; Heart disease in her father; Hypertension in her father; Lung cancer in her mother; Stroke in her maternal grandfather.   ROS:   Please see the history of present illness.    Review of Systems  Constitution: Positive for malaise/fatigue.  Cardiovascular: Positive for chest pain and dyspnea on exertion.  Respiratory: Positive for cough.   Musculoskeletal: Positive for back pain.  Gastrointestinal: Positive for abdominal pain.   All other systems reviewed and are negative.   Physical Exam:    VS:  BP 160/82 mmHg  Pulse 66  Ht 5' 4" (1.626 m)  Wt 180 lb 1.9 oz (81.702 kg)  BMI 30.90 kg/m2  SpO2 95%   GEN: Well nourished, well developed, in no acute distress HEENT: normal Neck: no JVD, no masses Cardiac: Normal S1/S2, RRR; no murmurs, rubs, or gallops, no edema;     Respiratory:  clear to auscultation bilaterally; no wheezing, rhonchi or rales GI: soft, nontender, nondistended MS: no deformity or atrophy Skin: warm and dry Neuro: No focal deficits  Psych: Alert and oriented x 3, normal affect  Wt Readings from Last 3 Encounters:  06/24/15 180 lb 1.9 oz (81.702 kg)  04/15/15 181 lb (82.101 kg)  10/07/14 181 lb (82.101 kg)      Studies/Labs Reviewed:     EKG:  EKG is  ordered today.  The ekg ordered today demonstrates NSR, HR 66, normal axis, no acute changes, QTc 446 ms, no change from prior tracing  Recent Labs: 10/07/2014: ALT 25; Hemoglobin 13.3; Platelets 183.0; TSH 1.83 10/16/2014: BUN 8; Creatinine, Ser 0.81; Potassium 4.0; Sodium 141   Recent Lipid Panel    Component Value Date/Time   CHOL 177 10/07/2014 1055   TRIG 129.0 10/07/2014 1055   HDL 53.30 10/07/2014 1055   CHOLHDL 3 10/07/2014 1055   VLDL 25.8  10/07/2014 1055   LDLCALC 98 10/07/2014 1055   LDLDIRECT 102.3 10/17/2011 0941    Additional studies/ records that were reviewed today include:   - LHC (3/02): Normal coronary arteries. Left ventricular function was normal. - ETT-Myoview (06/12/12): Low risk, apical thinning, no ischemia, EF 80%. - ETT-Myoview (11/15): Normal stress nuclear study. LV Ejection Fraction: 75%.   ASSESSMENT:     1. Unstable angina ([facility])   2. HLD (hyperlipidemia)   3. Essential   hypertension     PLAN:     In order of problems listed above:   1. Unstable Angina - She has a hx of microvascular angina with stable symptoms over the years.  She last had a heart cath in 2002.  Last myoview in 2015.  She now presents with worsening episodes of chest discomfort.  ECG is unchanged.  She has a strong FHx of CAD.  I have rec proceeding with cardiac cath to further evaluate. I reviewed this with Dr. Aundra Dubin by phone who agreed.  Risks and benefits of cardiac catheterization have been discussed with the patient.  These include bleeding, infection, kidney damage, stroke, heart attack, death.  The patient understands these risks and is willing to proceed.   -  Arrange LHC  -  Increase Amlodipine to 5 mg QD  -  Go to ED if worsening symptoms  -  Continue ASA, Toprol, Crestor.    2. Hyperlipemia: Recent lipid panel optimal. Continue statin, Lovaza.   3. HTN - BP elevated.  Increase Amlodipine as noted.    Medication Adjustments/Labs and Tests Ordered: Current medicines are reviewed at length with the patient today.  Concerns regarding medicines are outlined above.  Medication changes, Labs and Tests ordered today are outlined in the Patient Instructions noted below. Patient Instructions  Medication Instructions:  1. INCREASE AMLODIPINE TO 5 MG DAILY; NEW RX SENT IN Labwork: 1. TODAY BMET, CBC W/DIFF, PT/INR Testing/Procedures: Your physician has requested that you have a cardiac catheterization. Cardiac  catheterization is used to diagnose and/or treat various heart conditions. Doctors may recommend this procedure for a number of different reasons. The most common reason is to evaluate chest pain. Chest pain can be a symptom of coronary artery disease (CAD), and cardiac catheterization can show whether plaque is narrowing or blocking your heart's arteries. This procedure is also used to evaluate the valves, as well as measure the blood flow and oxygen levels in different parts of your heart. For further information please visit HugeFiesta.[email or web address]. Please follow instruction sheet, as given. Follow-Up: 07/12/15 @ 10:10 WITH Kurtis Anastasia, PAC  Any Other Special Instructions Will Be Listed Below (If Applicable). YOU HAVE BEEN INSTRUCTED TO GO TO THE ED IF YOUR CHEST PAIN GETS WORSE BEFORE YOUR HEART CATH ON 06/29/15 If you need a refill on your cardiac medications before your next appointment, please call your pharmacy.     Signed, Richardson Dopp, PA-C  06/24/2015 4:42 PM    [facility] [address], [place], [place]  [address] Phone: [phone]; Fax: [phone]

## 2015-06-30 MED FILL — Nitroglycerin IV Soln 100 MCG/ML in D5W: INTRA_ARTERIAL | Qty: 10 | Status: AC

## 2015-06-30 MED FILL — Heparin Sodium (Porcine) 2 Unit/ML in Sodium Chloride 0.9%: INTRAMUSCULAR | Qty: 500 | Status: AC

## 2015-07-01 ENCOUNTER — Telehealth: Payer: Self-pay | Admitting: *Deleted

## 2015-07-01 MED ORDER — ISOSORBIDE MONONITRATE ER 30 MG PO TB24: ORAL_TABLET | ORAL | Status: DC

## 2015-07-01 MED FILL — ISOSORBIDE MN ER 30 MG TAB: 30 | 90 days supply | Qty: 45 | Fill #0

## 2015-07-01 NOTE — Telephone Encounter (Signed)
Patient called and stated that upon her recent hospital discharge, Dr Aundra Dubin gave her a hand written rx for indur. They have been to five different pharmacies and none of them have the medication. She thinks that the issue with this is that the rx is for brand name. I do not see any order on her chart for this. She would like the rx to be sent to [facility]. She can be reached at [phone]. Please advise. Thanks, MI

## 2015-07-01 NOTE — Telephone Encounter (Signed)
Prescription for Imdur 30mg  (1/2 ) tablet daily #45 x 3 refills sent to [facility]. Per pharmacy--generic is available.   LMTCB for pt to let her know

## 2015-07-01 NOTE — Telephone Encounter (Signed)
Pt advised generic is available at [facility], she will need to take 1/2 of a Imdur 30mg  tablet daily.

## 2015-07-12 ENCOUNTER — Encounter: Payer: Self-pay | Admitting: Physician Assistant

## 2015-07-12 ENCOUNTER — Ambulatory Visit (INDEPENDENT_AMBULATORY_CARE_PROVIDER_SITE_OTHER): Payer: BLUE CROSS/BLUE SHIELD | Admitting: Physician Assistant

## 2015-07-12 VITALS — BP 140/60 | HR 64 | Ht 64.0 in | Wt 182.0 lb

## 2015-07-12 DIAGNOSIS — I209 Angina pectoris, unspecified: Secondary | ICD-10-CM

## 2015-07-12 DIAGNOSIS — I10 Essential (primary) hypertension: Secondary | ICD-10-CM

## 2015-07-12 DIAGNOSIS — E785 Hyperlipidemia, unspecified: Secondary | ICD-10-CM | POA: Diagnosis not present

## 2015-07-12 MED ORDER — ISOSORBIDE MONONITRATE ER 30 MG PO TB24: 30.0000 mg | ORAL_TABLET | Freq: Every day | ORAL | Status: DC

## 2015-07-12 MED ORDER — AMLODIPINE BESYLATE 5 MG PO TABS: 2.5000 mg | ORAL_TABLET | Freq: Every day | ORAL | Status: DC

## 2015-07-12 MED FILL — METOPROLOL SUCC ER 25 MG TA: 25 | 30 days supply | Qty: 15 | Fill #4

## 2015-07-12 MED FILL — ROSUVASTATIN CALCIUM 5 MG T: 5 | 30 days supply | Qty: 30 | Fill #2

## 2015-07-12 NOTE — Progress Notes (Signed)
Cardiology Office Note:    Date:  07/12/2015   ID:  [name], DOB Jul 13, 1950, MRN [id number]  PCP:  Chesley Noon, MD  Cardiologist:  Dr. [name]   Electrophysiologist:  n/a  Referring MD: Chesley Noon, MD   Chief Complaint  Patient presents with  . Chest Pain    s/p cardiac cath    History of Present Illness:     [name] is a 66 y.o. female with a hx of microvascular angina and hyperlipidemia. She has had chest pain for over 10 years. She has had a pattern of tightness in her chest sometimes radiating to the left arm as well as dyspnea when walking up hills or if she carries a heavy load up steps. This tends to resolve if she takes nitroglycerin.She has been unable to tolerate nitrates due to headaches. Ranexa was too expensive. Symptoms have improved on amlodipine. She underwent laparoscopic cholecystectomy in [facility] 12/2013. Nuclear stress test prior to her surgery was normal.  She has a hx of dizziness and bradycardia that improved on lower dose beta-blocker.    I saw her 06/24/15 with worsening anginal symptoms. After discussion with Dr. Aundra Dubin, we set her up for cardiac catheterization. This demonstrated normal coronary arteries. Suspicion continued for microvascular angina and she was tried on low-dose nitrate therapy. She returns for follow-up.    Here today with her husband. She did go to [place] and [place] after her cath with a church group. With excessive walking, she did have recurrent chest symptoms. She also had some recurrent chest symptoms after a church group met last night. Overall, her symptoms do seem less intense with isosorbide. She has had significant headaches but these are improving. She's felt fatigued since we increased her amlodipine at last visit. She denies syncope. Denies PND. Denies LE edema.   Past Medical History  Diagnosis Date  . Hypertension   . Hypercholesteremia   . Syncope 2008    syncopal episode,  probably related to excessive stress, activity, and fatigue      . 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 Sinus problem   . Hx of cardiovascular stress test     a. 7/10 - normal, EF 72% // b. Myoview 4/14 - low risk, apical thinning, no ischemia, EF 80% //  c. ETT-Myoview (11/15):  Normal stress nuclear study. No ischemia.  LV Ejection Fraction: 75%  . Hx of cardiac cath     a.  LHC (3/02): Normal coronary arteries. Left ventricular function was normal  //  b. LHC 06/29/15 - normal coronary arteries, EF 60-65%    Past Surgical History  Procedure Laterality Date  . Cardiac catheterization  2002    normal  . Cardiac catheterization    . Cardiac catheterization N/A 06/29/2015    Procedure: Left Heart Cath and Coronary Angiography;  Surgeon: Larey Dresser, MD;  Location: [facility];  Service: Cardiovascular;  Laterality: N/A;    Current Medications: Outpatient Prescriptions Prior to Visit  Medication Sig Dispense Refill  . acetaminophen (TYLENOL) 500 MG tablet Take 1,000 mg by mouth daily as needed for mild pain or headache.    Marland Kitchen aspirin EC 81 MG tablet Take 81 mg by mouth daily.    . cetirizine (ZYRTEC) 10 MG tablet Take 10 mg by mouth daily.    . hydrochlorothiazide (HYDRODIURIL) 25 MG tablet TAKE 1 TABLET BY MOUTH ONCE DAILY 30 tablet 11  . ibuprofen (ADVIL,MOTRIN) 200  MG tablet Take 400 mg by mouth 2 (two) times daily as needed for headache or moderate pain.    . metoprolol succinate (TOPROL-XL) 25 MG 24 hr tablet Take 0.5 tablets (12.5 mg total) by mouth daily. 15 tablet 5  . omega-3 acid ethyl esters (LOVAZA) 1 g capsule TAKE 2 CAPSULES BY MOUTH ONCE DAILY 60 capsule 9  . rosuvastatin (CRESTOR) 5 MG tablet TAKE 1 TABLET BY MOUTH ONCE DAILY 30 tablet 10  . amLODipine (NORVASC) 5 MG tablet Take 1 tablet (5 mg total) by mouth daily. 90 tablet 3  . isosorbide mononitrate (IMDUR) 30 MG 24 hr tablet 1/2 tablet (9m) daily (Patient not taking: Reported on 07/12/2015)  45 tablet 3  . nitroGLYCERIN (NITROSTAT) 0.4 MG SL tablet Place 1 tablet (0.4 mg total) under the tongue every 5 (five) minutes as needed. (Patient not taking: Reported on 07/12/2015) 25 tablet 6  . potassium chloride SA (KLOR-CON M20) 20 MEQ tablet Take 1 tablet (20 mEq total) by mouth daily. (Patient not taking: Reported on 07/12/2015) 90 tablet 3   No facility-administered medications prior to visit.     Current Meds  Medication Sig  . acetaminophen (TYLENOL) 500 MG tablet Take 1,000 mg by mouth daily as needed for mild pain or headache.  .Marland KitchenamLODipine (NORVASC) 5 MG tablet Take 0.5 tablets (2.5 mg total) by mouth daily.  .Marland Kitchenaspirin EC 81 MG tablet Take 81 mg by mouth daily.  . cetirizine (ZYRTEC) 10 MG tablet Take 10 mg by mouth daily.  . hydrochlorothiazide (HYDRODIURIL) 25 MG tablet TAKE 1 TABLET BY MOUTH ONCE DAILY  . ibuprofen (ADVIL,MOTRIN) 200 MG tablet Take 400 mg by mouth 2 (two) times daily as needed for headache or moderate pain.  . metoprolol succinate (TOPROL-XL) 25 MG 24 hr tablet Take 0.5 tablets (12.5 mg total) by mouth daily.  . nitroGLYCERIN (NITROSTAT) 0.4 MG SL tablet Place 0.4 mg under the tongue every 5 (five) minutes as needed for chest pain.  .Marland Kitchenomega-3 acid ethyl esters (LOVAZA) 1 g capsule TAKE 2 CAPSULES BY MOUTH ONCE DAILY  . rosuvastatin (CRESTOR) 5 MG tablet TAKE 1 TABLET BY MOUTH ONCE DAILY  . [DISCONTINUED] amLODipine (NORVASC) 5 MG tablet Take 1 tablet (5 mg total) by mouth daily.  . [DISCONTINUED] isosorbide dinitrate (ISORDIL) 30 MG tablet TAKE 1/2 TABLET (15 MG TOTAL) BY MOUTH ONCE DAILY     Allergies:   Aspirin; Lactose intolerance (gi); Potassium-containing compounds; and Adhesive   Social History   Social History  . Marital Status: Married    Spouse Name: N/A  . Number of Children: N/A  . Years of Education: N/A   Social History Main Topics  . Smoking status: Never Smoker   . Smokeless tobacco: Never Used  . Alcohol Use: No  . Drug Use: No  .  Sexual Activity: Not Asked   Other Topics Concern  . None   Social History Narrative     Family History:  The patient's family history includes Cancer in her mother; Diabetes in her maternal grandfather; Heart attack in her mother; Heart disease in her father; Hypertension in her father; Lung cancer in her mother; Stroke in her maternal grandfather.   ROS:   Please see the history of present illness.    Review of Systems  Cardiovascular: Positive for chest pain and dyspnea on exertion.  Musculoskeletal: Positive for back pain.   All other systems reviewed and are negative.   Physical Exam:    VS:  BP  140/60 mmHg  Pulse 64  Ht 5' 4" (1.626 m)  Wt 182 lb (82.555 kg)  BMI 31.22 kg/m2   GEN: Well nourished, well developed, in no acute distress HEENT: normal Neck: no JVD, no masses Cardiac: Normal S1/S2, RRR; no murmurs, rubs, or gallops, no edema;    right wrist without hematoma or mass  Respiratory:  clear to auscultation bilaterally; no wheezing, rhonchi or rales GI: soft, nontender, nondistended MS: no deformity or atrophy Skin: warm and dry Neuro: No focal deficits  Psych: Alert and oriented x 3, normal affect  Wt Readings from Last 3 Encounters:  07/12/15 182 lb (82.555 kg)  06/29/15 180 lb (81.647 kg)  06/24/15 180 lb 1.9 oz (81.702 kg)      Studies/Labs Reviewed:     EKG:  EKG is  Not ordered today.  The ekg ordered today demonstrates n/a  Recent Labs: 10/07/2014: ALT 25; TSH 1.83 06/24/2015: BUN 12; Creat 0.81; Hemoglobin 13.3; Platelets 230; Potassium 3.8; Sodium 141   Recent Lipid Panel    Component Value Date/Time   CHOL 177 10/07/2014 1055   TRIG 129.0 10/07/2014 1055   HDL 53.30 10/07/2014 1055   CHOLHDL 3 10/07/2014 1055   VLDL 25.8 10/07/2014 1055   LDLCALC 98 10/07/2014 1055   LDLDIRECT 102.3 10/17/2011 0941    Additional studies/ records that were reviewed today include:   LHC 06/29/15 LM no significant disease LAD no angiographic CAD LCx no  angiographic CAD RCA no angiographic CAD EF 60-65% Patient's exertional chest pain is likely due to microvascular angina. Will try her on low dose Imdur at 15 mg daily. She had trouble with headache at higher doses in the past.    ASSESSMENT:     1. Angina pectoris syndrome ([place])   2. Essential hypertension   3. Hyperlipemia     PLAN:     In order of problems listed above:   1. Microvascular angina -  Recent cardiac catheterization with normal coronary arteries. She continues to have chest symptoms. There is somewhat improved on isosorbide. She would like to continue on her current dose for now and see how she does. She also feels fatigued from amlodipine. She can cut this back to 2.5 mg daily. If her chest symptoms continue and her headaches resolve, she may increase her isosorbide to 30 mg daily.  Continue current doses of ASA, Toprol, Crestor.    2. Hyperlipemia: Recent lipid panel optimal. Continue statin, Lovaza.   3. HTN - BP optimal at home. I have asked her to monitor her BP at home with decreasing the dose of Amlodipine.      Medication Adjustments/Labs and Tests Ordered: Current medicines are reviewed at length with the patient today.  Concerns regarding medicines are outlined above.  Medication changes, Labs and Tests ordered today are outlined in the Patient Instructions noted below. Patient Instructions  Medication Instructions:  1. DECREASE AMLODIPINE TO 2.5 MG DAILY (THIS IS 1/2 TABLET OF THE 5 MG) 2. CONTINUE ON THE IMDUR 15 MG DAILY; IF YOUR SYMPTOMS CONTINUE OK PER Keleigh Kazee, PAC TO INCREASE IMDUR TO 30 MG DAILY (WHOLE TABLET) Labwork: NONE Testing/Procedures: NONE Follow-Up: DR. Aundra Dubin 10/22/15 @ 3:15 Any Other Special Instructions Will Be Listed Below (If Applicable). CHECK BP AT HOME  AND CALL IF BP IS ABOVE 140/90 CONSISTENTLY [phone]  If you need a refill on your cardiac medications before your next appointment, please call your  pharmacy.   Signed, Richardson Dopp, PA-C  07/12/2015 5:42 PM  [facility] [address], [place], [place]  [address] Phone: [phone]; Fax: [phone]

## 2015-07-12 NOTE — Patient Instructions (Addendum)
Medication Instructions:  1. DECREASE AMLODIPINE TO 2.5 MG DAILY (THIS IS 1/2 TABLET OF THE 5 MG) 2. CONTINUE ON THE IMDUR 15 MG DAILY; IF YOUR SYMPTOMS CONTINUE OK PER SCOTT WEAVER, PAC TO INCREASE IMDUR TO 30 MG DAILY (WHOLE TABLET) Labwork: NONE Testing/Procedures: NONE Follow-Up: DR. Aundra Dubin 10/22/15 @ 3:15 Any Other Special Instructions Will Be Listed Below (If Applicable). CHECK BP AT HOME  AND CALL IF BP IS ABOVE 140/90 CONSISTENTLY [phone]  If you need a refill on your cardiac medications before your next appointment, please call your pharmacy.

## 2015-07-20 MED FILL — OMEGA-3 ETHYL ESTERS 1 GM C: 1 | 30 days supply | Qty: 60 | Fill #1

## 2015-07-20 MED FILL — HYDROCHLOROTHIAZIDE 25 MG T: 25 | 30 days supply | Qty: 30 | Fill #2

## 2015-08-11 MED FILL — ROSUVASTATIN CALCIUM 5 MG T: 5 | 30 days supply | Qty: 30 | Fill #3

## 2015-08-11 MED FILL — METOPROLOL SUCC ER 25 MG TA: 25 | 30 days supply | Qty: 15 | Fill #5

## 2015-08-18 MED FILL — HYDROCHLOROTHIAZIDE 25 MG T: 25 | 30 days supply | Qty: 30 | Fill #3

## 2015-08-25 MED FILL — OMEGA-3 ETHYL ESTERS 1 GM C: 1 | 30 days supply | Qty: 60 | Fill #2

## 2015-09-13 MED FILL — ROSUVASTATIN CALCIUM 5 MG T: 5 | 30 days supply | Qty: 30 | Fill #4

## 2015-09-14 ENCOUNTER — Other Ambulatory Visit: Payer: Self-pay | Admitting: *Deleted

## 2015-09-14 MED ORDER — METOPROLOL SUCCINATE ER 25 MG PO TB24: 12.5000 mg | ORAL_TABLET | Freq: Every day | ORAL | 11 refills | Status: DC

## 2015-09-14 MED FILL — METOPROLOL SUCC ER 25 MG TA: 25 | 30 days supply | Qty: 15 | Fill #0

## 2015-09-21 MED FILL — HYDROCHLOROTHIAZIDE 25 MG T: 25 | 30 days supply | Qty: 30 | Fill #4

## 2015-09-27 MED FILL — AMLODIPINE BESYLATE 5 MG TA: 5 | 90 days supply | Qty: 90 | Fill #1

## 2015-09-27 MED FILL — ISOSORBIDE MN ER 30 MG TAB: 30 | 90 days supply | Qty: 45 | Fill #1

## 2015-09-27 MED FILL — OMEGA-3 ETHYL ESTERS 1 GM C: 1 | 30 days supply | Qty: 60 | Fill #3

## 2015-10-08 ENCOUNTER — Encounter: Payer: Self-pay | Admitting: Cardiology

## 2015-10-14 MED FILL — METOPROLOL SUCC ER 25 MG TA: 25 | 30 days supply | Qty: 15 | Fill #1

## 2015-10-14 MED FILL — ROSUVASTATIN CALCIUM 5 MG T: 5 | 30 days supply | Qty: 30 | Fill #5

## 2015-10-21 MED FILL — HYDROCHLOROTHIAZIDE 25 MG T: 25 | 30 days supply | Qty: 30 | Fill #5

## 2015-10-22 ENCOUNTER — Encounter (INDEPENDENT_AMBULATORY_CARE_PROVIDER_SITE_OTHER): Payer: Self-pay

## 2015-10-22 ENCOUNTER — Encounter: Payer: Self-pay | Admitting: Cardiology

## 2015-10-22 ENCOUNTER — Ambulatory Visit (INDEPENDENT_AMBULATORY_CARE_PROVIDER_SITE_OTHER): Payer: BLUE CROSS/BLUE SHIELD | Admitting: Cardiology

## 2015-10-22 VITALS — BP 134/72 | HR 56 | Ht 64.0 in | Wt 173.0 lb

## 2015-10-22 DIAGNOSIS — E785 Hyperlipidemia, unspecified: Secondary | ICD-10-CM | POA: Diagnosis not present

## 2015-10-22 DIAGNOSIS — I209 Angina pectoris, unspecified: Secondary | ICD-10-CM

## 2015-10-22 NOTE — Patient Instructions (Signed)
Medication Instructions:  Your physician has recommended you make the following change in your medication:  1. STOP Crestor (Rosuvastatin) for 1 WEEK to see if your symptoms improve, if no change in symptoms please restart Crestor.  If symptoms improve please contact the office so that we can give further instructions  Labwork: No new orders.   Testing/Procedures: No new orders.   Follow-Up: Your physician wants you to follow-up in: 1 YEAR with Dr Angelena Form.  You will receive a reminder letter in the mail two months in advance. If you don't receive a letter, please call our office to schedule the follow-up appointment.   Any Other Special Instructions Will Be Listed Below (If Applicable).     If you need a refill on your cardiac medications before your next appointment, please call your pharmacy.

## 2015-10-24 NOTE — Progress Notes (Signed)
Patient ID: [name], female   DOB: 05/11/1950, 65 y.o.   MRN: [id number] PCP: Dr. Melford Aase  65 yo with history of microvascular angina and hyperlipidemia presents for cardiology followup. She has had chest pain for over 10 years.  She has had a pattern of tightness in her chest sometimes radiating to the left arm as well as dyspnea when walking up hills or if she carries a heavy load up steps.  This tends to resolve if she takes nitroglycerin.    Given worsening of symptoms, she had LHC in 5/17. This showed no angiographic CAD, EF 60-65%.  She was started on Imdur.  This has led to significant improvement in her chest pain.  She initially had headaches with Imdur but over time these have resolved.  She has noted tenderness/weakness in her arms and legs.    Labs (5/12): K 3.6, creatinine 0.9, TGs 178, HDL 57, LDL 160 Labs (2/13): HDL 58, LDL 86, TGs 203 Labs (8/13): HDL 59, LDL 102, LFTs normal Labs (9/13): AST 40, ALT 40, K 3.3, creatinine 0.87 Labs (10/13): K 3.8, creatinine 0.8, LFTs normal Labs (5/14): K 3.5, creatinine 0.78 Labs (6/14): LDL 84, HDL 54 Labs (5/17): K 3.8, creatinine 0.81, HCT 39.2  PMH: 1. Microvascular angina: Patient had left heart cath in 3/02 with normal coronaries.  She had a stress cardiolite in 7/10 that was normal.   - LHC (6/17) showed normal coronaries, EF 60-65%.  2. Right renal artery stenosis noted on 3/12 cath 3. HTN 4. Vasovagal symptoms 5. Hyperlipidemia: Myalgias with several statins, tolerating low dose Crestor.   SH: Married, lives with husband [name] of [place].  He is a Company secretary and she helps out at his church.  3 daughters.  Nonsmoker, no ETOH.   FH: Mother died at 61 with lung cancer, she thinks mother had an MI in her 49s.  Father had an MI in his 59s.    Current Outpatient Prescriptions  Medication Sig Dispense Refill  . acetaminophen (TYLENOL) 500 MG tablet Take 1,000 mg by mouth daily as needed for mild pain or headache.    Marland Kitchen  amLODipine (NORVASC) 5 MG tablet Take 0.5 tablets (2.5 mg total) by mouth daily.    Marland Kitchen aspirin EC 81 MG tablet Take 81 mg by mouth daily.    . cetirizine (ZYRTEC) 10 MG tablet Take 10 mg by mouth daily.    . hydrochlorothiazide (HYDRODIURIL) 25 MG tablet TAKE 1 TABLET BY MOUTH ONCE DAILY 30 tablet 11  . ibuprofen (ADVIL,MOTRIN) 200 MG tablet Take 400 mg by mouth 2 (two) times daily as needed for headache or moderate pain.    . isosorbide mononitrate (IMDUR) 30 MG 24 hr tablet Take 1 tablet (30 mg total) by mouth daily. 90 tablet 3  . metoprolol succinate (TOPROL-XL) 25 MG 24 hr tablet Take 0.5 tablets (12.5 mg total) by mouth daily. 15 tablet 11  . nitroGLYCERIN (NITROSTAT) 0.4 MG SL tablet Place 0.4 mg under the tongue every 5 (five) minutes as needed for chest pain.    Marland Kitchen omega-3 acid ethyl esters (LOVAZA) 1 g capsule TAKE 2 CAPSULES BY MOUTH ONCE DAILY 60 capsule 9  . rosuvastatin (CRESTOR) 5 MG tablet HOLD starting 10/22/2015 30 tablet 10   No current facility-administered medications for this visit.     BP 134/72   Pulse (!) 56   Ht 5\' 4"  (1.626 m)   Wt 173 lb (78.5 kg)   BMI 29.70 kg/m  General: NAD Neck: No  JVD, no thyromegaly or thyroid nodule.  Lungs: Clear to auscultation bilaterally with normal respiratory effort. CV: Nondisplaced PMI.  Heart regular S1/S2, no S3/S4, no murmur.  No peripheral edema.  No carotid bruit.  Normal pedal pulses.  Abdomen: Soft, nontender, no hepatosplenomegaly, no distention.  Neurologic: Alert and oriented x 3.  Psych: Normal affect. Extremities: No clubbing or cyanosis.   Assessment/Plan  Angina pectoris syndrome  Patient has had chronic chest pain with moderate to heavy exertion as well as rare episodes at rest. This pattern has been present for over 10 years. She had a normal cath in 3/02 and a normal cardiolite in 4/14. She had another normla cardiac cath in 6/17. She is thought to have microvascular angina. She is currently on Toprol XL,  amlodipine, and Imdur.  She feels like Imdur has helped considerably.  Hyperlipemia  She has had tenderness in her arms/legs for a couple of months.  I will have her stop Crestor for now.  She will call back in 10 days or so to let me know if this has helped the symptoms at all.  If not, she will need to restart Crestor.  If it does help to stop the Crestor, will choose another agent.   [name] 10/24/2015

## 2015-11-02 MED FILL — OMEGA-3 ETHYL ESTERS 1 GM C: 1 | 30 days supply | Qty: 60 | Fill #4

## 2015-11-15 MED FILL — METOPROLOL SUCC ER 25 MG TA: 25 | 30 days supply | Qty: 15 | Fill #2

## 2015-11-22 MED FILL — HYDROCHLOROTHIAZIDE 25 MG T: 25 | 30 days supply | Qty: 30 | Fill #6

## 2015-12-06 MED FILL — OMEGA-3 ETHYL ESTERS 1 GM C: 1 | 30 days supply | Qty: 60 | Fill #5

## 2015-12-16 MED FILL — METOPROLOL SUCC ER 25 MG TA: 25 | 30 days supply | Qty: 15 | Fill #3

## 2015-12-24 MED FILL — ISOSORBIDE MN ER 30 MG TAB: 30 | 90 days supply | Qty: 45 | Fill #2

## 2015-12-24 MED FILL — AMLODIPINE BESYLATE 5 MG TA: 5 | 90 days supply | Qty: 90 | Fill #2

## 2015-12-24 MED FILL — HYDROCHLOROTHIAZIDE 25 MG T: 25 | 30 days supply | Qty: 30 | Fill #7

## 2016-01-06 MED FILL — OMEGA-3 ETHYL ESTERS 1 GM C: 1 | 30 days supply | Qty: 60 | Fill #6

## 2016-01-14 MED FILL — METOPROLOL SUCC ER 25 MG TA: 25 | 30 days supply | Qty: 15 | Fill #4

## 2016-01-27 MED FILL — HYDROCHLOROTHIAZIDE 25 MG T: 25 | 30 days supply | Qty: 30 | Fill #8

## 2016-02-08 MED FILL — OMEGA-3 ETHYL ESTERS 1 GM C: 1 | 30 days supply | Qty: 60 | Fill #7

## 2016-02-16 MED FILL — METOPROLOL SUCC ER 25 MG TA: 25 | 30 days supply | Qty: 15 | Fill #5

## 2016-02-28 MED FILL — HYDROCHLOROTHIAZIDE 25 MG T: 25 | 30 days supply | Qty: 30 | Fill #9

## 2016-03-13 MED FILL — OMEGA-3 ETHYL ESTERS 1 GM C: 1 | 30 days supply | Qty: 60 | Fill #8

## 2016-03-13 MED FILL — METOPROLOL SUCC ER 25 MG TA: 25 | 30 days supply | Qty: 15 | Fill #6

## 2016-03-23 MED FILL — ISOSORBIDE MN ER 30 MG TAB: 30 | 90 days supply | Qty: 45 | Fill #3

## 2016-03-28 MED FILL — AMLODIPINE BESYLATE 5 MG TA: 5 | 90 days supply | Qty: 90 | Fill #3

## 2016-04-06 MED FILL — HYDROCHLOROTHIAZIDE 25 MG T: 25 | 30 days supply | Qty: 30 | Fill #10

## 2016-04-11 MED FILL — OMEGA-3 ETHYL ESTERS 1 GM C: 1 | 30 days supply | Qty: 60 | Fill #9

## 2016-04-11 MED FILL — METOPROLOL SUCC ER 25 MG TA: 25 | 30 days supply | Qty: 15 | Fill #7

## 2016-05-15 ENCOUNTER — Other Ambulatory Visit: Payer: Self-pay | Admitting: Cardiology

## 2016-05-15 MED FILL — METOPROLOL SUCC ER 25 MG TA: 25 | 30 days supply | Qty: 15 | Fill #8

## 2016-05-15 MED FILL — OMEGA-3 ETHYL ESTERS 1 GM C: 1 | 30 days supply | Qty: 60 | Fill #0

## 2016-06-14 MED FILL — METOPROLOL SUCC ER 25 MG TA: 25 | 30 days supply | Qty: 15 | Fill #9

## 2016-06-14 MED FILL — OMEGA-3 ETHYL ESTERS 1 GM C: 1 | 30 days supply | Qty: 60 | Fill #1

## 2016-06-15 MED FILL — ISOSORBIDE MN ER 30 MG TAB: 30 | 90 days supply | Qty: 90 | Fill #0

## 2016-06-27 ENCOUNTER — Other Ambulatory Visit: Payer: Self-pay | Admitting: Physician Assistant

## 2016-06-28 MED FILL — AMLODIPINE BESYLATE 5 MG TA: 5 | 90 days supply | Qty: 90 | Fill #0

## 2016-07-12 MED FILL — OMEGA-3 ETHYL ESTERS 1 GM C: 1 | 30 days supply | Qty: 60 | Fill #2

## 2016-07-12 MED FILL — METOPROLOL SUCC ER 25 MG TA: 25 | 30 days supply | Qty: 15 | Fill #10

## 2016-08-15 MED FILL — METOPROLOL SUCC ER 25 MG TA: 25 | 30 days supply | Qty: 15 | Fill #11

## 2016-08-22 MED FILL — OMEGA-3 ETHYL ESTERS 1 GM C: 1 | 30 days supply | Qty: 60 | Fill #3

## 2016-09-14 ENCOUNTER — Other Ambulatory Visit: Payer: Self-pay | Admitting: Cardiology

## 2016-09-15 MED FILL — METOPROLOL SUCC ER 25 MG TA: 25 | 30 days supply | Qty: 15 | Fill #0

## 2016-09-18 MED FILL — OMEGA-3 ETHYL ESTERS 1 GM C: 1 | 30 days supply | Qty: 60 | Fill #4

## 2016-09-28 MED FILL — AMLODIPINE BESYLATE 5 MG TA: 5 | 90 days supply | Qty: 90 | Fill #1

## 2016-10-16 MED FILL — METOPROLOL SUCC ER 25 MG TA: 25 | 30 days supply | Qty: 15 | Fill #1

## 2016-10-24 MED FILL — OMEGA-3 ETHYL ESTERS 1 GM C: 1 | 30 days supply | Qty: 60 | Fill #5

## 2016-10-25 ENCOUNTER — Encounter (INDEPENDENT_AMBULATORY_CARE_PROVIDER_SITE_OTHER): Payer: Self-pay

## 2016-10-25 ENCOUNTER — Ambulatory Visit (INDEPENDENT_AMBULATORY_CARE_PROVIDER_SITE_OTHER): Payer: Medicare Other | Admitting: Cardiovascular Disease

## 2016-10-25 VITALS — BP 138/82 | HR 64 | Ht 64.0 in | Wt 183.3 lb

## 2016-10-25 DIAGNOSIS — I209 Angina pectoris, unspecified: Secondary | ICD-10-CM

## 2016-10-25 DIAGNOSIS — E7800 Pure hypercholesterolemia, unspecified: Secondary | ICD-10-CM | POA: Diagnosis not present

## 2016-10-25 NOTE — Progress Notes (Signed)
Chief Complaint  Patient presents with  . Follow-up    microvascular angina   History of Present Illness: 66 yo female with history of microvascular angina, HTN and HLD who is here today for cardiac follow up. She has been followed in the past in our office by Dr. Aundra Dubin. She is suspected to have microvascular angina with chest pain occurring when walking up hills. Cardiac cath May 2017 with no evidence of CAD. Normal LV systolic function by cath. She was started on Imdur which helped her chest pain.   She is here today for follow up. The patient denies any chest pain, dyspnea, palpitations, lower extremity edema, orthopnea, PND, dizziness, near syncope or syncope.    Primary Care Physician: Chesley Noon, MD  Past Medical History:  Diagnosis Date  . History of angina    probably microvascular angina  . Hx of cardiac cath    a.  LHC (3/02): Normal coronary arteries. Left ventricular function was normal  //  b. LHC 06/29/15 - normal coronary arteries, EF 60-65%  . Hx of cardiovascular stress test    a. 7/10 - normal, EF 72% // b. Myoview 4/14 - low risk, apical thinning, no ischemia, EF 80% //  c. ETT-Myoview (11/15):  Normal stress nuclear study. No ischemia.  LV Ejection Fraction: 75%  . Hypercholesteremia   . Hypertension   . Sinus problem   . Syncope 2008   syncopal episode, probably related to excessive stress, activity, and fatigue        Past Surgical History:  Procedure Laterality Date  . ABDOMINAL HYSTERECTOMY    . CARDIAC CATHETERIZATION  2002   normal  . CARDIAC CATHETERIZATION    . CARDIAC CATHETERIZATION N/A 06/29/2015   Procedure: Left Heart Cath and Coronary Angiography;  Surgeon: Larey Dresser, MD;  Location: [facility];  Service: Cardiovascular;  Laterality: N/A;  . CHOLECYSTECTOMY      Current Outpatient Prescriptions  Medication Sig Dispense Refill  . acetaminophen (TYLENOL) 500 MG tablet Take 1,000 mg by mouth daily as needed for mild pain or  headache.    Marland Kitchen amLODipine (NORVASC) 5 MG tablet TAKE 1 TABLET BY MOUTH DAILY. 90 tablet 1  . aspirin EC 81 MG tablet Take 81 mg by mouth daily.    . cetirizine (ZYRTEC) 10 MG tablet Take 10 mg by mouth daily.    Marland Kitchen ibuprofen (ADVIL,MOTRIN) 200 MG tablet Take 400 mg by mouth 2 (two) times daily as needed for headache or moderate pain.    . isosorbide mononitrate (IMDUR) 30 MG 24 hr tablet Take 15 mg by mouth daily. Pt takes half a tablet    . metoprolol succinate (TOPROL-XL) 25 MG 24 hr tablet Take 0.5 tablets (12.5 mg total) by mouth daily. Follow up due 10/2016 15 tablet 11  . nitroGLYCERIN (NITROSTAT) 0.4 MG SL tablet Place 0.4 mg under the tongue every 5 (five) minutes as needed for chest pain.    Marland Kitchen omega-3 acid ethyl esters (LOVAZA) 1 g capsule TAKE 2 CAPSULES BY MOUTH ONCE DAILY 60 capsule 9   No current facility-administered medications for this visit.     Allergies  Allergen Reactions  . Adhesive [Tape] Itching and Rash    Redness, Please use "paper" tape Redness, Please use "paper" tape  . Aspirin Diarrhea and Nausea Only    Can take COATED a baby aspirin  . Lactose Intolerance (Gi) Diarrhea  . Potassium-Containing Compounds Other (See Comments)    Indigestion  Social History   Social History  . Marital status: Married    Spouse name: N/A  . Number of children: N/A  . Years of education: N/A   Occupational History  . Not on file.   Social History Main Topics  . Smoking status: Never Smoker  . Smokeless tobacco: Never Used  . Alcohol use No  . Drug use: No  . Sexual activity: Not on file   Other Topics Concern  . Not on file   Social History Narrative  . No narrative on file    Family History  Problem Relation Age of Onset  . Hypertension Father   . Heart attack Father 61  . Heart attack Mother        questionable  . Lung cancer Mother   . Cancer Mother   . Coronary artery disease Unknown        strong family history   . Stroke Maternal Grandfather    . Diabetes Maternal Grandfather     Review of Systems:  As stated in the HPI and otherwise negative.   BP 138/82   Pulse 64   Ht 5\' 4"  (1.626 m)   Wt 183 lb 4.8 oz (83.1 kg)   SpO2 97%   BMI 31.46 kg/m   Physical Examination: General: Well developed, well nourished, NAD  HEENT: OP clear, mucus membranes moist  SKIN: warm, dry. No rashes. Neuro: No focal deficits  Musculoskeletal: Muscle strength 5/5 all ext  Psychiatric: Mood and affect normal  Neck: No JVD, no carotid bruits, no thyromegaly, no lymphadenopathy.  Lungs:Clear bilaterally, no wheezes, rhonci, crackles Cardiovascular: Regular rate and rhythm. No murmurs, gallops or rubs. Abdomen:Soft. Bowel sounds present. Non-tender.  Extremities: No lower extremity edema. Pulses are 2 + in the bilateral DP/PT.  EKG:  EKG is ordered today. The ekg ordered today demonstrates NSR, rate 63 bpm  Recent Labs: No results found for requested labs within last 8760 hours.   Lipid Panel    Component Value Date/Time   CHOL 177 10/07/2014 1055   TRIG 129.0 10/07/2014 1055   HDL 53.30 10/07/2014 1055   CHOLHDL 3 10/07/2014 1055   VLDL 25.8 10/07/2014 1055   LDLCALC 98 10/07/2014 1055   LDLDIRECT 102.3 10/17/2011 0941     Wt Readings from Last 3 Encounters:  10/25/16 183 lb 4.8 oz (83.1 kg)  10/22/15 173 lb (78.5 kg)  07/12/15 182 lb (82.6 kg)     Other studies Reviewed: Additional studies/ records that were reviewed today include: Review of the above records demonstrates:  Assessment and Plan:   1. Microvascular angina: She is having no chest pain on Toprol, Norvasc and Imdur. No changes.   2. Hyperlipidemia: Statin on hold. She is not sure that her leg cramps improved off of the statin. She also stopped HCTZ and this helped with her leg cramps too. I will check lipids and LFTs now and based on results will consider restarting statin.    Current medicines are reviewed at length with the patient today.  The patient does  not have concerns regarding medicines.  The following changes have been made:  no change  Labs/ tests ordered today include:   Orders Placed This Encounter  Procedures  . Lipid Profile  . Hepatic function panel  . EKG 12-Lead   Disposition:   FU with me in 12 months  Signed, Lauree Chandler, MD 10/25/2016 10:29 AM    [facility] [place], [place]  [address] Phone: [phone]; Fax: [phone]

## 2016-10-25 NOTE — Patient Instructions (Addendum)
Medication Instructions:  Your physician recommends that you continue on your current medications as directed. Please refer to the Current Medication list given to you today.   Labwork: Your physician recommends that you return for lab work tomorrow.  This will be fasting (lipid and liver profiles).  The lab opens at 7:30 AM    Testing/Procedures: none  Follow-Up: Your physician recommends that you schedule a follow-up appointment in: 12 months.  Please call our office in about 9 months to schedule this appointment.     Any Other Special Instructions Will Be Listed Below (If Applicable).     If you need a refill on your cardiac medications before your next appointment, please call your pharmacy.

## 2016-10-26 ENCOUNTER — Other Ambulatory Visit: Payer: Medicare Other | Admitting: *Deleted

## 2016-10-26 ENCOUNTER — Other Ambulatory Visit: Payer: Self-pay | Admitting: *Deleted

## 2016-10-26 DIAGNOSIS — E7800 Pure hypercholesterolemia, unspecified: Secondary | ICD-10-CM

## 2016-10-26 DIAGNOSIS — E7849 Other hyperlipidemia: Secondary | ICD-10-CM

## 2016-10-26 LAB — HEPATIC FUNCTION PANEL
ALK PHOS: 62 IU/L (ref 39–117)
ALT: 25 IU/L (ref 0–32)
AST: 28 IU/L (ref 0–40)
Albumin: 4.3 g/dL (ref 3.6–4.8)
BILIRUBIN, DIRECT: 0.07 mg/dL (ref 0.00–0.40)
Bilirubin Total: 0.4 mg/dL (ref 0.0–1.2)
Total Protein: 6.8 g/dL (ref 6.0–8.5)

## 2016-10-26 LAB — LIPID PANEL
CHOLESTEROL TOTAL: 255 mg/dL — AB (ref 100–199)
Chol/HDL Ratio: 5.1 ratio — ABNORMAL HIGH (ref 0.0–4.4)
HDL: 50 mg/dL (ref >39)
LDL Calculated: 169 mg/dL — ABNORMAL HIGH (ref 0–99)
TRIGLYCERIDES: 180 mg/dL — AB (ref 0–149)
VLDL Cholesterol Cal: 36 mg/dL (ref 5–40)

## 2016-10-26 MED ORDER — ROSUVASTATIN CALCIUM 5 MG PO TABS: 5.0000 mg | ORAL_TABLET | Freq: Every day | ORAL | 6 refills | Status: DC

## 2016-10-27 MED FILL — ROSUVASTATIN CALCIUM 5 MG T: 5 | 30 days supply | Qty: 30 | Fill #0

## 2016-11-16 MED FILL — METOPROLOL SUCC ER 25 MG TA: 25 | 30 days supply | Qty: 15 | Fill #2

## 2016-11-27 MED FILL — ROSUVASTATIN CALCIUM 5 MG T: 5 | 30 days supply | Qty: 30 | Fill #1

## 2016-11-27 MED FILL — OMEGA-3 ETHYL ESTERS 1 GM C: 1 | 30 days supply | Qty: 60 | Fill #6

## 2016-12-14 MED FILL — METOPROLOL SUCC ER 25 MG TA: 25 | 30 days supply | Qty: 15 | Fill #3

## 2016-12-19 ENCOUNTER — Other Ambulatory Visit: Payer: Self-pay | Admitting: Physician Assistant

## 2016-12-19 DIAGNOSIS — I209 Angina pectoris, unspecified: Secondary | ICD-10-CM

## 2016-12-19 MED ORDER — ROSUVASTATIN CALCIUM 5 MG PO TABS
5.0000 mg | ORAL_TABLET | Freq: Every day | ORAL | 3 refills | Status: DC
Start: 2016-12-19 — End: 2017-04-26

## 2016-12-19 MED FILL — ISOSORBIDE MN ER 30 MG TAB: 30 | 90 days supply | Qty: 45 | Fill #0

## 2016-12-28 ENCOUNTER — Other Ambulatory Visit: Payer: Self-pay | Admitting: Cardiology

## 2016-12-28 MED FILL — ROSUVASTATIN CALCIUM 5 MG T: 5 | 30 days supply | Qty: 30 | Fill #2

## 2016-12-29 MED FILL — AMLODIPINE BESYLATE 5 MG TA: 5 | 90 days supply | Qty: 90 | Fill #0

## 2017-01-02 MED FILL — OMEGA-3 ETHYL ESTERS 1 GM C: 1 | 30 days supply | Qty: 60 | Fill #7

## 2017-01-16 MED FILL — METOPROLOL SUCC ER 25 MG TA: 25 | 30 days supply | Qty: 15 | Fill #4

## 2017-01-24 ENCOUNTER — Other Ambulatory Visit: Payer: Medicare Other | Admitting: *Deleted

## 2017-01-24 DIAGNOSIS — E7849 Other hyperlipidemia: Secondary | ICD-10-CM

## 2017-01-24 LAB — HEPATIC FUNCTION PANEL
ALT: 24 IU/L (ref 0–32)
AST: 29 IU/L (ref 0–40)
Albumin: 4.6 g/dL (ref 3.6–4.8)
Alkaline Phosphatase: 58 IU/L (ref 39–117)
BILIRUBIN TOTAL: 0.3 mg/dL (ref 0.0–1.2)
Bilirubin, Direct: 0.08 mg/dL (ref 0.00–0.40)
Total Protein: 6.8 g/dL (ref 6.0–8.5)

## 2017-01-24 LAB — LIPID PANEL
CHOLESTEROL TOTAL: 143 mg/dL (ref 100–199)
Chol/HDL Ratio: 2.5 ratio (ref 0.0–4.4)
HDL: 57 mg/dL (ref >39)
LDL CALC: 65 mg/dL (ref 0–99)
TRIGLYCERIDES: 107 mg/dL (ref 0–149)
VLDL Cholesterol Cal: 21 mg/dL (ref 5–40)

## 2017-01-30 MED FILL — ROSUVASTATIN CALCIUM 5 MG T: 5 | 30 days supply | Qty: 30 | Fill #3

## 2017-02-02 MED FILL — OMEGA-3 ETHYL ESTERS 1 GM C: 1 | 30 days supply | Qty: 60 | Fill #8

## 2017-02-05 MED FILL — FLUOROURACIL 5% CREAM: 5 | 14 days supply | Qty: 40 | Fill #0

## 2017-02-15 MED FILL — METOPROLOL SUCC ER 25 MG TA: 25 | 30 days supply | Qty: 15 | Fill #5

## 2017-03-02 MED FILL — ROSUVASTATIN CALCIUM 5 MG T: 5 | 30 days supply | Qty: 30 | Fill #4

## 2017-03-06 MED FILL — OMEGA-3 ETHYL ESTERS 1 GM C: 1 | 30 days supply | Qty: 60 | Fill #9

## 2017-03-08 ENCOUNTER — Telehealth: Payer: Self-pay | Admitting: *Deleted

## 2017-03-08 NOTE — Telephone Encounter (Addendum)
Formulary exception completed for OMEGA-3 ACID for [employer], will fax over for Dr Oleh Genin office for signature.

## 2017-03-16 MED FILL — METOPROLOL SUCC ER 25 MG TA: 25 | 30 days supply | Qty: 15 | Fill #6

## 2017-03-23 MED FILL — ISOSORBIDE MN ER 30 MG TAB: 30 | 90 days supply | Qty: 45 | Fill #1

## 2017-03-30 MED FILL — AMLODIPINE BESYLATE 5 MG TA: 5 | 90 days supply | Qty: 90 | Fill #1

## 2017-03-30 MED FILL — ROSUVASTATIN CALCIUM 5 MG T: 5 | 30 days supply | Qty: 30 | Fill #5

## 2017-04-06 ENCOUNTER — Other Ambulatory Visit: Payer: Self-pay | Admitting: Family Medicine

## 2017-04-06 DIAGNOSIS — Z1231 Encounter for screening mammogram for malignant neoplasm of breast: Secondary | ICD-10-CM

## 2017-04-18 ENCOUNTER — Other Ambulatory Visit: Payer: Self-pay | Admitting: Cardiovascular Disease

## 2017-04-18 MED FILL — METOPROLOL SUCCINATE ER 25: 25 | 30 days supply | Qty: 15 | Fill #7

## 2017-04-19 ENCOUNTER — Telehealth: Payer: Self-pay | Admitting: *Deleted

## 2017-04-19 MED ORDER — ICOSAPENT ETHYL 1 G PO CAPS: 2.0000 g | ORAL_CAPSULE | Freq: Two times a day (BID) | ORAL | 3 refills | Status: DC

## 2017-04-19 NOTE — Telephone Encounter (Signed)
Received fax from pharmacy stating Lovaza is not covered by insurance. Alternatives given. Dr. Angelena Form would like pt to start Vascepa  I placed call to pt and left message to call back.

## 2017-04-19 NOTE — Telephone Encounter (Signed)
I spoke with pt and gave her information regarding medication coverage.  She would like to change to Vascepa.  Will send prescription to [facility].

## 2017-04-19 NOTE — Telephone Encounter (Signed)
Vascepa dose is 2 grams by mouth twice daily.

## 2017-04-20 ENCOUNTER — Telehealth: Payer: Self-pay | Admitting: Cardiovascular Disease

## 2017-04-20 NOTE — Telephone Encounter (Signed)
Upon review of chart welchol was stopped in 2016 because it was no longer covered by her insurance. Will review with pharmacist

## 2017-04-20 NOTE — Telephone Encounter (Signed)
I reviewed with Fuller Canada, PharmD and pt's triglycerides are OK. Does not need medication for this. Should continue Crestor.  Can take 2 grams OTC fish oil if she would like I placed call to pt and left message to call office

## 2017-04-20 NOTE — Telephone Encounter (Signed)
I spoke with pt who reports Vascepa will cost $150 per month.  Insurance company told her [name] would be $45 per month and gemfibrozil would be $13 per month.  Pt thinks she has been on Welchol in the past but does not remember why it was changed.

## 2017-04-20 NOTE — Telephone Encounter (Signed)
New Message   Patient is calling in reference to medication. Please call to discuss.

## 2017-04-20 NOTE — Telephone Encounter (Signed)
I spoke with pt and gave her information from pharmacist.  Will cancel prescription for Vescepa.

## 2017-04-26 ENCOUNTER — Other Ambulatory Visit: Payer: Self-pay | Admitting: Cardiovascular Disease

## 2017-04-26 ENCOUNTER — Ambulatory Visit
Admission: RE | Admit: 2017-04-26 | Discharge: 2017-04-26 | Disposition: A | Discharge status: Home / Self Care | Payer: Medicare Other | Source: Ambulatory Visit | Attending: Family Medicine | Admitting: Family Medicine

## 2017-04-26 DIAGNOSIS — Z1231 Encounter for screening mammogram for malignant neoplasm of breast: Secondary | ICD-10-CM

## 2017-04-26 MED ORDER — ROSUVASTATIN CALCIUM 5 MG PO TABS: 5.0000 mg | ORAL_TABLET | Freq: Every day | ORAL | 1 refills | Status: DC

## 2017-05-08 MED FILL — ROSUVASTATIN CALCIUM 5 MG T: 5 | 30 days supply | Qty: 30 | Fill #6

## 2017-05-16 MED FILL — METOPROLOL SUCCINATE ER 25: 25 | 30 days supply | Qty: 15 | Fill #8

## 2017-06-18 MED FILL — ISOSORBIDE MN ER 30 MG TAB: 30 | 90 days supply | Qty: 45 | Fill #2

## 2017-06-18 MED FILL — METOPROLOL SUCCINATE ER 25: 25 | 30 days supply | Qty: 15 | Fill #9

## 2017-07-02 MED FILL — AMLODIPINE BESYLATE 5 MG TA: 5 | 90 days supply | Qty: 90 | Fill #2

## 2017-07-18 MED FILL — METOPROLOL SUCCINATE ER 25: 25 | 30 days supply | Qty: 15 | Fill #10

## 2017-08-14 MED FILL — METOPROLOL SUCCINATE ER 25: 25 | 30 days supply | Qty: 15 | Fill #11

## 2017-09-07 ENCOUNTER — Other Ambulatory Visit: Payer: Self-pay | Admitting: Cardiology

## 2017-09-07 MED FILL — METOPROLOL SUCCINATE ER 25: 25 | 30 days supply | Qty: 15 | Fill #0

## 2017-09-17 MED FILL — ISOSORBIDE MN ER 30 MG TAB: 30 | 90 days supply | Qty: 45 | Fill #3

## 2017-10-01 ENCOUNTER — Other Ambulatory Visit: Payer: Self-pay | Admitting: Cardiovascular Disease

## 2017-10-01 MED FILL — AMLODIPINE BESYLATE 5 MG TA: 5 | 90 days supply | Qty: 90 | Fill #0

## 2017-10-16 MED FILL — METOPROLOL SUCCINATE ER 25: 25 | 30 days supply | Qty: 15 | Fill #1

## 2017-11-13 ENCOUNTER — Other Ambulatory Visit: Payer: Self-pay | Admitting: Cardiovascular Disease

## 2017-11-14 MED FILL — METOPROLOL SUCCINATE ER 25: 25 | 30 days supply | Qty: 15 | Fill #0

## 2017-12-06 ENCOUNTER — Ambulatory Visit (INDEPENDENT_AMBULATORY_CARE_PROVIDER_SITE_OTHER): Payer: Medicare Other | Admitting: Cardiovascular Disease

## 2017-12-06 ENCOUNTER — Encounter: Payer: Self-pay | Admitting: Cardiovascular Disease

## 2017-12-06 VITALS — BP 160/98 | HR 66 | Ht 64.0 in | Wt 187.8 lb

## 2017-12-06 DIAGNOSIS — I209 Angina pectoris, unspecified: Secondary | ICD-10-CM

## 2017-12-06 MED ORDER — NITROGLYCERIN 0.4 MG SL SUBL: 0.4000 mg | SUBLINGUAL_TABLET | SUBLINGUAL | 6 refills | Status: DC | PRN

## 2017-12-06 MED FILL — NITROGLYCERIN 0.4 MG TAB SL: 0.4 | 10 days supply | Qty: 25 | Fill #0

## 2017-12-06 NOTE — Patient Instructions (Signed)

## 2017-12-06 NOTE — Progress Notes (Signed)
Chief Complaint  Patient presents with  . Follow-up    angina   History of Present Illness: 67 yo female with history of microvascular angina, HTN and HLD who is here today for cardiac follow up. She has been followed in the past in our office by Dr. Aundra Dubin. She is suspected to have microvascular angina with chest pain occurring when walking up hills. Cardiac cath May 2017 with no evidence of CAD. Normal LV systolic function by cath. She was started on Imdur which helped her chest pain.   She is here today for follow up. The patient denies any chest pain, dyspnea, palpitations, lower extremity edema, orthopnea, PND, dizziness, near syncope or syncope.   Primary Care Physician: Chesley Noon, MD  Past Medical History:  Diagnosis Date  . History of angina    probably microvascular angina  . Hx of cardiac cath    a.  LHC (3/02): Normal coronary arteries. Left ventricular function was normal  //  b. LHC 06/29/15 - normal coronary arteries, EF 60-65%  . Hx of cardiovascular stress test    a. 7/10 - normal, EF 72% // b. Myoview 4/14 - low risk, apical thinning, no ischemia, EF 80% //  c. ETT-Myoview (11/15):  Normal stress nuclear study. No ischemia.  LV Ejection Fraction: 75%  . Hypercholesteremia   . Hypertension   . Sinus problem   . Syncope 2008   syncopal episode, probably related to excessive stress, activity, and fatigue        Past Surgical History:  Procedure Laterality Date  . ABDOMINAL HYSTERECTOMY    . CARDIAC CATHETERIZATION  2002   normal  . CARDIAC CATHETERIZATION    . CARDIAC CATHETERIZATION N/A 06/29/2015   Procedure: Left Heart Cath and Coronary Angiography;  Surgeon: Larey Dresser, MD;  Location: [facility];  Service: Cardiovascular;  Laterality: N/A;  . CHOLECYSTECTOMY      Current Outpatient Medications  Medication Sig Dispense Refill  . amLODipine (NORVASC) 5 MG tablet TAKE 1 TABLET BY MOUTH DAILY. 90 tablet 0  . aspirin EC 81 MG tablet Take 81  mg by mouth daily.    . cetirizine (ZYRTEC) 10 MG tablet Take 10 mg by mouth daily.    Marland Kitchen ibuprofen (ADVIL,MOTRIN) 200 MG tablet Take 400 mg by mouth 2 (two) times daily as needed for headache or moderate pain.    . isosorbide mononitrate (IMDUR) 30 MG 24 hr tablet Take 0.5 tablets (15 mg total) by mouth daily. 45 tablet 3  . metoprolol succinate (TOPROL-XL) 25 MG 24 hr tablet Take 0.5 tablets (12.5 mg total) by mouth daily. Please keep upcoming appt in October with Dr. Angelena Form for future refills. Thank you 15 tablet 0  . nitroGLYCERIN (NITROSTAT) 0.4 MG SL tablet Place 1 tablet (0.4 mg total) under the tongue every 5 (five) minutes as needed for chest pain. 25 tablet 6   No current facility-administered medications for this visit.     Allergies  Allergen Reactions  . Adhesive [Tape] Itching and Rash    Redness, Please use "paper" tape Redness, Please use "paper" tape  . Aspirin Diarrhea and Nausea Only    Can take COATED a baby aspirin Can take a baby aspirin  . Lactose Intolerance (Gi) Diarrhea  . Potassium-Containing Compounds Other (See Comments)    Indigestion    Social History   Socioeconomic History  . Marital status: Married    Spouse name: Not on file  . Number of children: Not  on file  . Years of education: Not on file  . Highest education level: Not on file  Occupational History  . Not on file  Social Needs  . Financial resource strain: Not on file  . Food insecurity:    Worry: Not on file    Inability: Not on file  . Transportation needs:    Medical: Not on file    Non-medical: Not on file  Tobacco Use  . Smoking status: Never Smoker  . Smokeless tobacco: Never Used  Substance and Sexual Activity  . Alcohol use: No  . Drug use: No  . Sexual activity: Not on file  Lifestyle  . Physical activity:    Days per week: Not on file    Minutes per session: Not on file  . Stress: Not on file  Relationships  . Social connections:    Talks on phone: Not on file      Gets together: Not on file    Attends religious service: Not on file    Active member of club or organization: Not on file    Attends meetings of clubs or organizations: Not on file    Relationship status: Not on file  . Intimate partner violence:    Fear of current or ex partner: Not on file    Emotionally abused: Not on file    Physically abused: Not on file    Forced sexual activity: Not on file  Other Topics Concern  . Not on file  Social History Narrative  . Not on file    Family History  Problem Relation Age of Onset  . Hypertension Father   . Heart attack Father 23  . Heart attack Mother        questionable  . Lung cancer Mother   . Cancer Mother   . Coronary artery disease Unknown        strong family history   . Stroke Maternal Grandfather   . Diabetes Maternal Grandfather     Review of Systems:  As stated in the HPI and otherwise negative.   BP (!) 160/98   Pulse 66   Ht 5\' 4"  (1.626 m)   Wt 187 lb 12.8 oz (85.2 kg)   SpO2 95%   BMI 32.24 kg/m   Physical Examination:  General: Well developed, well nourished, NAD  HEENT: OP clear, mucus membranes moist  SKIN: warm, dry. No rashes. Neuro: No focal deficits  Musculoskeletal: Muscle strength 5/5 all ext  Psychiatric: Mood and affect normal  Neck: No JVD, no carotid bruits, no thyromegaly, no lymphadenopathy.  Lungs:Clear bilaterally, no wheezes, rhonci, crackles Cardiovascular: Regular rate and rhythm. No murmurs, gallops or rubs. Abdomen:Soft. Bowel sounds present. Non-tender.  Extremities: No lower extremity edema. Pulses are 2 + in the bilateral DP/PT.  EKG:  EKG is ordered today. The ekg ordered today demonstrates NSR, rate 66 bpm  Recent Labs: 01/24/2017: ALT 24   Lipid Panel    Component Value Date/Time   CHOL 143 01/24/2017 0856   TRIG 107 01/24/2017 0856   HDL 57 01/24/2017 0856   CHOLHDL 2.5 01/24/2017 0856   CHOLHDL 3 10/07/2014 1055   VLDL 25.8 10/07/2014 1055   LDLCALC 65  01/24/2017 0856   LDLDIRECT 102.3 10/17/2011 0941     Wt Readings from Last 3 Encounters:  12/06/17 187 lb 12.8 oz (85.2 kg)  10/25/16 183 lb 4.8 oz (83.1 kg)  10/22/15 173 lb (78.5 kg)     Other studies Reviewed: Additional studies/  records that were reviewed today include: Review of the above records demonstrates:  Assessment and Plan:   1. Microvascular angina: No chest pain. Will continue Toprol, Imdur and Norvasc.    2. Hyperlipidemia: She has not tolerated statins due to muscle aches.   Current medicines are reviewed at length with the patient today.  The patient does not have concerns regarding medicines.  The following changes have been made:  no change  Labs/ tests ordered today include:   Orders Placed This Encounter  Procedures  . EKG 12-Lead   Disposition:   FU with me in 12 months  Signed, Lauree Chandler, MD 12/06/2017 3:33 PM    [facility] [place], [place], [place]  [address] Phone: [phone]; Fax: [phone]

## 2017-12-14 ENCOUNTER — Other Ambulatory Visit: Payer: Self-pay | Admitting: Cardiovascular Disease

## 2017-12-14 MED FILL — METOPROLOL SUCCINATE ER 25: 25 | 30 days supply | Qty: 15 | Fill #0

## 2017-12-17 ENCOUNTER — Other Ambulatory Visit: Payer: Self-pay

## 2017-12-17 ENCOUNTER — Emergency Department (HOSPITAL_COMMUNITY): Payer: Medicare Other

## 2017-12-17 ENCOUNTER — Encounter (HOSPITAL_COMMUNITY): Payer: Self-pay | Admitting: *Deleted

## 2017-12-17 ENCOUNTER — Observation Stay (HOSPITAL_COMMUNITY)
Admission: EM | Admit: 2017-12-17 | Discharge: 2017-12-18 | Disposition: A | Discharge status: Home / Self Care | Payer: Medicare Other | Attending: Internal Medicine | Admitting: Internal Medicine

## 2017-12-17 DIAGNOSIS — Z7982 Long term (current) use of aspirin: Secondary | ICD-10-CM | POA: Diagnosis not present

## 2017-12-17 DIAGNOSIS — Z79899 Other long term (current) drug therapy: Secondary | ICD-10-CM | POA: Diagnosis not present

## 2017-12-17 DIAGNOSIS — I10 Essential (primary) hypertension: Secondary | ICD-10-CM | POA: Diagnosis not present

## 2017-12-17 DIAGNOSIS — R079 Chest pain, unspecified: Secondary | ICD-10-CM | POA: Diagnosis not present

## 2017-12-17 DIAGNOSIS — E785 Hyperlipidemia, unspecified: Secondary | ICD-10-CM | POA: Insufficient documentation

## 2017-12-17 DIAGNOSIS — R911 Solitary pulmonary nodule: Secondary | ICD-10-CM | POA: Diagnosis not present

## 2017-12-17 DIAGNOSIS — I25119 Atherosclerotic heart disease of native coronary artery with unspecified angina pectoris: Secondary | ICD-10-CM | POA: Diagnosis not present

## 2017-12-17 DIAGNOSIS — I208 Other forms of angina pectoris: Secondary | ICD-10-CM

## 2017-12-17 DIAGNOSIS — E782 Mixed hyperlipidemia: Secondary | ICD-10-CM | POA: Diagnosis present

## 2017-12-17 LAB — CBC
HCT: 39.9 % (ref 36.0–46.0)
HCT: 38.9 % (ref 36.0–46.0)
HEMOGLOBIN: 13.1 g/dL (ref 12.0–15.0)
Hemoglobin: 12.9 g/dL (ref 12.0–15.0)
MCH: 28.9 pg (ref 26.0–34.0)
MCH: 28.9 pg (ref 26.0–34.0)
MCHC: 32.8 g/dL (ref 30.0–36.0)
MCHC: 33.2 g/dL (ref 30.0–36.0)
MCV: 87.9 fL (ref 80.0–100.0)
MCV: 87 fL (ref 80.0–100.0)
NRBC: 0 % (ref 0.0–0.2)
PLATELETS: 234 10*3/uL (ref 150–400)
PLATELETS: 208 10*3/uL (ref 150–400)
RBC: 4.54 MIL/uL (ref 3.87–5.11)
RBC: 4.47 MIL/uL (ref 3.87–5.11)
RDW: 12.6 % (ref 11.5–15.5)
RDW: 12.6 % (ref 11.5–15.5)
WBC: 5 10*3/uL (ref 4.0–10.5)
WBC: 5.3 10*3/uL (ref 4.0–10.5)
nRBC: 0 % (ref 0.0–0.2)

## 2017-12-17 LAB — BASIC METABOLIC PANEL
ANION GAP: 10 (ref 5–15)
BUN: 9 mg/dL (ref 8–23)
CHLORIDE: 105 mmol/L (ref 98–111)
CO2: 26 mmol/L (ref 22–32)
Calcium: 9.3 mg/dL (ref 8.9–10.3)
Creatinine, Ser: 0.81 mg/dL (ref 0.44–1.00)
GFR calc non Af Amer: >60 mL/min (ref >60)
Glucose, Bld: 139 mg/dL — ABNORMAL HIGH (ref 70–99)
POTASSIUM: 3.5 mmol/L (ref 3.5–5.1)
Sodium: 141 mmol/L (ref 135–145)

## 2017-12-17 LAB — CREATININE, SERUM
Creatinine, Ser: 0.82 mg/dL (ref 0.44–1.00)
GFR calc Af Amer: >60 mL/min (ref >60)
GFR calc non Af Amer: >60 mL/min (ref >60)

## 2017-12-17 LAB — TROPONIN I

## 2017-12-17 LAB — I-STAT TROPONIN, ED
TROPONIN I, POC: 0 ng/mL (ref 0.00–0.08)
Troponin i, poc: 0 ng/mL (ref 0.00–0.08)

## 2017-12-17 MED ORDER — AMLODIPINE BESYLATE 5 MG PO TABS
5.0000 mg | ORAL_TABLET | Freq: Every day | ORAL | Status: DC
  Administered 2017-12-17: 5 mg via ORAL
  Filled 2017-12-17: qty 1

## 2017-12-17 MED ORDER — ACETAMINOPHEN 325 MG PO TABS: 650.0000 mg | ORAL_TABLET | ORAL | Status: DC | PRN

## 2017-12-17 MED ORDER — ENOXAPARIN SODIUM 40 MG/0.4ML ~~LOC~~ SOLN
40.0000 mg | SUBCUTANEOUS | Status: DC
  Administered 2017-12-17: 40 mg via SUBCUTANEOUS
  Filled 2017-12-17: qty 0.4

## 2017-12-17 MED ORDER — NITROGLYCERIN 0.4 MG SL SUBL
0.4000 mg | SUBLINGUAL_TABLET | SUBLINGUAL | Status: DC | PRN
  Administered 2017-12-17: 0.4 mg via SUBLINGUAL
  Filled 2017-12-17: qty 1

## 2017-12-17 MED ORDER — LORATADINE 10 MG PO TABS
10.0000 mg | ORAL_TABLET | Freq: Every day | ORAL | Status: DC
  Administered 2017-12-18: 10 mg via ORAL
  Filled 2017-12-17: qty 1

## 2017-12-17 MED ORDER — IOPAMIDOL (ISOVUE-370) INJECTION 76%
100.0000 mL | Freq: Once | INTRAVENOUS | Status: AC | PRN
  Administered 2017-12-17: 100 mL via INTRAVENOUS

## 2017-12-17 MED ORDER — MAGNESIUM OXIDE 400 (241.3 MG) MG PO TABS
200.0000 mg | ORAL_TABLET | Freq: Every day | ORAL | Status: DC
  Administered 2017-12-18: 200 mg via ORAL
  Filled 2017-12-17: qty 1

## 2017-12-17 MED ORDER — ISOSORBIDE MONONITRATE ER 30 MG PO TB24: 15.0000 mg | ORAL_TABLET | Freq: Every day | ORAL | Status: DC

## 2017-12-17 MED ORDER — IOPAMIDOL (ISOVUE-370) INJECTION 76%
INTRAVENOUS | Status: AC
  Filled 2017-12-17: qty 100

## 2017-12-17 MED ORDER — ASPIRIN EC 81 MG PO TBEC
81.0000 mg | DELAYED_RELEASE_TABLET | Freq: Every day | ORAL | Status: DC
  Administered 2017-12-17: 81 mg via ORAL
  Filled 2017-12-17: qty 1

## 2017-12-17 MED ORDER — ONDANSETRON HCL 4 MG/2ML IJ SOLN: 4.0000 mg | Freq: Four times a day (QID) | INTRAMUSCULAR | Status: DC | PRN

## 2017-12-17 MED ORDER — NITROGLYCERIN 0.4 MG SL SUBL: 0.4000 mg | SUBLINGUAL_TABLET | SUBLINGUAL | Status: DC | PRN

## 2017-12-17 MED ORDER — METOPROLOL SUCCINATE 12.5 MG HALF TABLET
12.5000 mg | ORAL_TABLET | Freq: Every day | ORAL | Status: DC
  Administered 2017-12-17: 12.5 mg via ORAL
  Filled 2017-12-17: qty 1

## 2017-12-17 MED ORDER — MAGNESIUM 200 MG PO TABS: ORAL_TABLET | Freq: Every day | ORAL | Status: DC

## 2017-12-17 NOTE — ED Triage Notes (Signed)
Pt in c/o intermittent chest pain since Friday, worse today and significantly more intense for the last hour, also having episodes of bilateral leg pain and cramping, pain is to the center of her chest and radiates into her back, reports nausea but denies vomiting, also felt lightheaded

## 2017-12-17 NOTE — ED Notes (Addendum)
Pt is eating and drinking without difficulty.  She is resting and appears comfortable.  Denies any cp.  Hal Hope MD at bedside to admit pt

## 2017-12-17 NOTE — ED Notes (Signed)
Pt reports cp recurred after transferring from the CT table back to the stretcher.  CP radiates to her L shoulder.

## 2017-12-17 NOTE — ED Provider Notes (Signed)
[facility] EMERGENCY DEPARTMENT Provider Note   CSN: [id number] Arrival date & time: 12/17/17  1416     History   Chief Complaint Chief Complaint  Patient presents with  . Chest Pain    HPI [name] is a 67 y.o. female.  HPI   Intermittent chest pain over the last week, then Friday ahd an episode with pain in legs, chest pain radiating to the back and shoulder. CP Friday substernal pressure gen weakness and pain in legs and lightheadedness, tried to rest at home Sunday getting ready for church and began to have chest pain, gen weakness, neck and back pain.  Then 2PM today had more severe chest pain, substernal pressure, nausea, lightheadedness. Normally takes nitrates and asa but has not taken them today  CP described as pressure radiating to neck, was stabbing in nature earlier today No numbness, but did have tingling to bilateral lower extremities.  Chest pain has been exertional, worse with walking at home. Also notes dyspnea on exertion. Nausea, diaphoresis      Past Medical History:  Diagnosis Date  . History of angina    probably microvascular angina  . Hx of cardiac cath    a.  LHC (3/02): Normal coronary arteries. Left ventricular function was normal  //  b. LHC 06/29/15 - normal coronary arteries, EF 60-65%  . Hx of cardiovascular stress test    a. 7/10 - normal, EF 72% // b. Myoview 4/14 - low risk, apical thinning, no ischemia, EF 80% //  c. ETT-Myoview (11/15):  Normal stress nuclear study. No ischemia.  LV Ejection Fraction: 75%  . Hypercholesteremia   . Hypertension   . Sinus problem   . Syncope 2008   syncopal episode, probably related to excessive stress, activity, and fatigue        Patient Active Problem List   Diagnosis Date Noted  . Chest pain 12/17/2017  . 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 Hypertension 07/21/2010  . Hyperlipemia 07/21/2010    Past Surgical History:  Procedure Laterality Date  .  ABDOMINAL HYSTERECTOMY    . CARDIAC CATHETERIZATION  2002   normal  . CARDIAC CATHETERIZATION    . CARDIAC CATHETERIZATION N/A 06/29/2015   Procedure: Left Heart Cath and Coronary Angiography;  Surgeon: Larey Dresser, MD;  Location: [facility];  Service: Cardiovascular;  Laterality: N/A;  . CHOLECYSTECTOMY       OB History   None      Home Medications    Prior to Admission medications   Medication Sig Start Date End Date Taking? Authorizing Provider  amLODipine (NORVASC) 5 MG tablet TAKE 1 TABLET BY MOUTH DAILY. Patient taking differently: Take 5 mg by mouth at bedtime.  10/01/17  Yes Burnell Blanks, MD  aspirin EC 81 MG tablet Take 81 mg by mouth at bedtime.    Yes [provider]  cetirizine (ZYRTEC) 10 MG tablet Take 10 mg by mouth daily.   Yes [provider]  ibuprofen (ADVIL,MOTRIN) 200 MG tablet Take 400 mg by mouth 2 (two) times daily as needed for headache or moderate pain.   Yes [provider]  isosorbide mononitrate (IMDUR) 30 MG 24 hr tablet Take 0.5 tablets (15 mg total) by mouth daily. Patient taking differently: Take 15 mg by mouth daily at 2 PM.  12/19/16  Yes Weaver, Nicki Reaper T, PA-C  MAGNESIUM PO Take 1 tablet by mouth daily after lunch.   Yes [provider]  metoprolol succinate (TOPROL-XL) 25 MG 24 hr tablet TAKE 1/2 TABLET BY MOUTH DAILY . PLEASE KEEP UPCOMING APPT IN OCTOBER WITH DR. Angelena Form FOR FUTURE REFILLS. [place] YOU Patient taking differently: Take 12.5 mg by mouth at bedtime.  12/14/17  Yes Burnell Blanks, MD  nitroGLYCERIN (NITROSTAT) 0.4 MG SL tablet Place 1 tablet (0.4 mg total) under the tongue every 5 (five) minutes as needed for chest pain. 12/06/17  Yes Burnell Blanks, MD  POTASSIUM PO Take 1 tablet by mouth daily after lunch.   Yes [provider]    Family History Family History  Problem Relation Age of Onset  . Hypertension Father   . Heart attack Father 43  .  Heart attack Mother        questionable  . Lung cancer Mother   . Cancer Mother   . Coronary artery disease Unknown        strong family history   . Stroke Maternal Grandfather   . Diabetes Maternal Grandfather     Social History Social History   Tobacco Use  . Smoking status: Never Smoker  . Smokeless tobacco: Never Used  Substance Use Topics  . Alcohol use: No  . Drug use: No     Allergies   Adhesive [tape]; Aspirin; Lactose intolerance (gi); and Potassium-containing compounds   Review of Systems Review of Systems  Constitutional: Positive for diaphoresis and fatigue. Negative for fever.  HENT: Negative for sore throat.   Eyes: Negative for visual disturbance.  Respiratory: Negative for cough and shortness of breath.   Cardiovascular: Positive for chest pain.  Gastrointestinal: Positive for nausea. Negative for abdominal pain.  Genitourinary: Negative for difficulty urinating.  Musculoskeletal: Negative for back pain and neck pain.  Skin: Negative for rash.  Neurological: Positive for light-headedness. Negative for syncope and headaches.     Physical Exam Updated Vital Signs BP (!) 143/76 (BP Location: Right Arm)   Pulse 65   Temp 98 F (36.7 C) (Oral)   Resp 16   Ht 5' 4.5" (1.638 m)   Wt 84.9 kg   SpO2 96%   BMI 31.63 kg/m   Physical Exam  Constitutional: She is oriented to person, place, and time. She appears well-developed and well-nourished. No distress.  HENT:  Head: Normocephalic and atraumatic.  Eyes: Conjunctivae and EOM are normal.  Neck: Normal range of motion.  Cardiovascular: Normal rate, regular rhythm, normal heart sounds and intact distal pulses. Exam reveals no gallop and no friction rub.  No murmur heard. Pulmonary/Chest: Effort normal and breath sounds normal. No respiratory distress. She has no wheezes. She has no rales.  Abdominal: Soft. She exhibits no distension. There is no tenderness. There is no guarding.  Musculoskeletal:  She exhibits no edema or tenderness.  Neurological: She is alert and oriented to person, place, and time.  Skin: Skin is warm and dry. No rash noted. She is not diaphoretic. No erythema.  Nursing note and vitals reviewed.    ED Treatments / Results  Labs (all labs ordered are listed, but only abnormal results are displayed) Labs Reviewed  BASIC METABOLIC PANEL - Abnormal; Notable for the following components:      Result Value   Glucose, Bld 139 (*)    All other components within normal limits  CBC  CBC  CREATININE, SERUM  TROPONIN I  HIV ANTIBODY (ROUTINE TESTING W REFLEX)  TROPONIN I  TROPONIN I  I-STAT TROPONIN, ED  I-STAT TROPONIN, ED    EKG EKG Interpretation  Date/Time:  Monday December 17 2017 14:16:34 EDT Ventricular Rate:  65 PR Interval:  144 QRS Duration: 88 QT Interval:  418 QTC Calculation: 434 R Axis:   24 Text Interpretation:  Normal sinus rhythm Cannot rule out Anterior infarct , age undetermined Abnormal ECG No significant change since last tracing Confirmed by Gareth Morgan [phone]) on 12/17/2017 6:12:08 PM   Radiology Dg Chest 2 View  Result Date: 12/17/2017 CLINICAL DATA:  Intermittent mid chest pain recently, worse today. Lightheadedness and nausea. EXAM: CHEST - 2 VIEW COMPARISON:  06/22/2012 FINDINGS: The cardiomediastinal silhouette is within normal limits. The lungs are well inflated and clear. There is no evidence of pleural effusion or pneumothorax. No acute osseous abnormality is identified. Right upper quadrant abdominal surgical clips are noted. IMPRESSION: No active cardiopulmonary disease. Electronically Signed   By: Logan Bores M.D.   On: 12/17/2017 15:42   Ct Angio Chest/abd/pel For Dissection W And/or Wo Contrast  Result Date: 12/17/2017 CLINICAL DATA:  Intermittent chest pain worsening today. Bilateral leg pain and cramping. EXAM: CT ANGIOGRAPHY CHEST, ABDOMEN AND PELVIS TECHNIQUE: Multidetector CT imaging through the chest,  abdomen and pelvis was performed using the standard protocol during bolus administration of intravenous contrast. Multiplanar reconstructed images and MIPs were obtained and reviewed to evaluate the vascular anatomy. CONTRAST:  129mL ISOVUE-370 IOPAMIDOL (ISOVUE-370) INJECTION 76% COMPARISON:  CT abdomen 04/23/2003 FINDINGS: CTA CHEST FINDINGS Cardiovascular: The initial noncontrast images demonstrate no acute intramural hematoma in the thoracic aorta. Minimal atherosclerotic calcification of the aortic arch and left anterior descending coronary artery. No dissection or aneurysm. Although today's exam was optimized for systemic arterial opacification, pulmonary arterial opacification is certainly adequate to exclude large or moderate-sized pulmonary embolus. No cardiomegaly or pericardial fluid. Mediastinum/Nodes: Unremarkable Lungs/Pleura: Mild biapical pleuroparenchymal scarring. Ground-glass density pulmonary nodule measuring 0.8 by 0.6 cm posteriorly in the left upper lobe on image 23/7. Densely calcified 5 mm granuloma in the left lower lobe on image 94/7. Musculoskeletal: Mild thoracic spondylosis. Review of the MIP images confirms the above findings. CTA ABDOMEN AND PELVIS FINDINGS VASCULAR Aorta: Aortoiliac atherosclerotic vascular disease. Celiac: The celiac trunk and its branches appear normal. SMA: Unremarkable Renals: Single bilateral renal arteries appear normal. IMA: Patent. Inflow: Minimal atherosclerotic vascular calcification in the common iliac arteries. Veins: Unremarkable Review of the MIP images confirms the above findings. NON-VASCULAR Hepatobiliary: Cholecystectomy. Mild hepatic steatosis with slight sparing along the gallbladder fossa. Otherwise unremarkable. Pancreas: Unremarkable Spleen: Unremarkable Adrenals/Urinary Tract: Numerous vascular calcifications adjacent to the distal ureters but no definite urinary tract calculi. No hydronephrosis. Urinary bladder normal. Left kidney lower pole  peripelvic renal cyst. Stomach/Bowel: Descending and sigmoid colon diverticulosis without active diverticulitis. Lymphatic: Unremarkable Reproductive: Uterus absent.  Adnexa unremarkable. Other: No supplemental non-categorized findings. Musculoskeletal: Disc bulge and intervertebral spurring at L1-2 without overt impingement. Review of the MIP images confirms the above findings. IMPRESSION: 1. No acute vascular findings. There is mild thoracic and abdominal aortic atherosclerotic vascular calcification along with faint punctate calcification in the left anterior descending coronary artery. 2. Ground-glass density pulmonary nodule in the left upper lobe measuring 0.7 cm in average diameter. Initial follow-up with CT at 6-12 months is recommended to confirm persistence. If persistent, repeat CT is recommended every 2 years until 5 years of stability has been established. This recommendation follows the consensus statement: Guidelines for Management of Incidental Pulmonary Nodules Detected on CT Images: From the Fleischner Society 2017; Radiology 2017; 284:228-243. 3. Mild thoracic spondylosis along with spurring and degenerative disc disease  at L1-2, but without overt impingement. 4. Descending and sigmoid colon diverticulosis. Electronically Signed   By: Van Clines M.D.   On: 12/17/2017 19:59    Procedures Procedures (including critical care time)  Medications Ordered in ED Medications  nitroGLYCERIN (NITROSTAT) SL tablet 0.4 mg (0.4 mg Sublingual Given 12/17/17 1947)  iopamidol (ISOVUE-370) 76 % injection (has no administration in time range)  aspirin EC tablet 81 mg (81 mg Oral Given 12/17/17 2230)  amLODipine (NORVASC) tablet 5 mg (5 mg Oral Given 12/17/17 2230)  isosorbide mononitrate (IMDUR) 24 hr tablet 15 mg (has no administration in time range)  metoprolol succinate (TOPROL-XL) 24 hr tablet 12.5 mg (12.5 mg Oral Given 12/17/17 2259)  loratadine (CLARITIN) tablet 10 mg (has no  administration in time range)  acetaminophen (TYLENOL) tablet 650 mg (has no administration in time range)  ondansetron (ZOFRAN) injection 4 mg (has no administration in time range)  enoxaparin (LOVENOX) injection 40 mg (40 mg Subcutaneous Given 12/17/17 2230)  magnesium oxide (MAG-OX) tablet 200 mg (has no administration in time range)  iopamidol (ISOVUE-370) 76 % injection 100 mL (100 mLs Intravenous Contrast Given 12/17/17 1934)     Initial Impression / Assessment and Plan / ED Course  I have reviewed the triage vital signs and the nursing notes.  Pertinent labs & imaging results that were available during my care of the patient were reviewed by me and considered in my medical decision making (see chart for details).     67yo female with history of hypertension, hyperlipidemia, microvascular angina who presents with concern for chest pain.  EKG without significant changes.  Chest x-ray without signs of pneumonia, pneumothorax or pulmonary edema.  CT dissection study done given radiation of pain to the back, some associated tingling, shows no signs of dissection or large pulmonary embolus.  Concern given exertional chest pressure for angina.  Given patient's high risk heart score, and ongoing intermittent symptoms, will admit for chest pain observation.  Final Clinical Impressions(s) / ED Diagnoses   Final diagnoses:  Chest pain, unspecified type  Microvascular angina [facility])    ED Discharge Orders    None       Gareth Morgan, MD 12/18/17 0246

## 2017-12-17 NOTE — H&P (Signed)
History and Physical    [name] GQQ:[id number] DOB: 1950/11/05 DOA: 12/17/2017  PCP: Chesley Noon, MD  Patient coming from: Home.  Chief Complaint: Chest pain.  HPI: [name] is a 67 y.o. female with history of microvascular angina last cardiac cath in May 2017 was unremarkable presents with the ER because of chest pain.  Patient states over the last 2 weeks patient has been having increasing exertional chest pressure and which acutely worsened this afternoon.  The pain is mostly retrosternal radiating to the back this afternoon it also was associated some numbness in the lower extremity.  Had some diaphoresis and nausea.  Denies any abdominal pain shortness of breath or productive cough.  ED Course: In the ER patient symptoms improved with sublingual nitroglycerin.  CT angiogram of the chest was unremarkable.  EKG was showing normal sinus rhythm with nonspecific changes.  Review of Systems: As per HPI, rest all negative.   Past Medical History:  Diagnosis Date  . History of angina    probably microvascular angina  . Hx of cardiac cath    a.  LHC (3/02): Normal coronary arteries. Left ventricular function was normal  //  b. LHC 06/29/15 - normal coronary arteries, EF 60-65%  . Hx of cardiovascular stress test    a. 7/10 - normal, EF 72% // b. Myoview 4/14 - low risk, apical thinning, no ischemia, EF 80% //  c. ETT-Myoview (11/15):  Normal stress nuclear study. No ischemia.  LV Ejection Fraction: 75%  . Hypercholesteremia   . Hypertension   . Sinus problem   . Syncope 2008   syncopal episode, probably related to excessive stress, activity, and fatigue        Past Surgical History:  Procedure Laterality Date  . ABDOMINAL HYSTERECTOMY    . CARDIAC CATHETERIZATION  2002   normal  . CARDIAC CATHETERIZATION    . CARDIAC CATHETERIZATION N/A 06/29/2015   Procedure: Left Heart Cath and Coronary Angiography;  Surgeon: Larey Dresser, MD;  Location: [facility];  Service: Cardiovascular;  Laterality: N/A;  . CHOLECYSTECTOMY       reports that she has never smoked. She has never used smokeless tobacco. She reports that she does not drink alcohol or use drugs.  Allergies  Allergen Reactions  . Adhesive [Tape] Itching and Rash    Redness, Please use "paper" tape   . Aspirin Diarrhea and Nausea Only    Can tolerate low dose coated aspirin  . Lactose Intolerance (Gi) Diarrhea  . Potassium-Containing Compounds Other (See Comments)    Indigestion    Family History  Problem Relation Age of Onset  . Hypertension Father   . Heart attack Father 63  . Heart attack Mother        questionable  . Lung cancer Mother   . Cancer Mother   . Coronary artery disease Unknown        strong family history   . Stroke Maternal Grandfather   . Diabetes Maternal Grandfather     Prior to Admission medications   Medication Sig Start Date End Date Taking? Authorizing Provider  amLODipine (NORVASC) 5 MG tablet TAKE 1 TABLET BY MOUTH DAILY. Patient taking differently: Take 5 mg by mouth at bedtime.  10/01/17  Yes Burnell Blanks, MD  aspirin EC 81 MG tablet Take 81 mg by mouth at bedtime.    Yes [provider]  cetirizine (ZYRTEC) 10 MG tablet Take 10 mg by mouth daily.  Yes [provider]  ibuprofen (ADVIL,MOTRIN) 200 MG tablet Take 400 mg by mouth 2 (two) times daily as needed for headache or moderate pain.   Yes [provider]  isosorbide mononitrate (IMDUR) 30 MG 24 hr tablet Take 0.5 tablets (15 mg total) by mouth daily. Patient taking differently: Take 15 mg by mouth daily at 2 PM.  12/19/16  Yes Weaver, Nicki Reaper T, PA-C  MAGNESIUM PO Take 1 tablet by mouth daily after lunch.   Yes [provider]  metoprolol succinate (TOPROL-XL) 25 MG 24 hr tablet TAKE 1/2 TABLET BY MOUTH DAILY . PLEASE KEEP UPCOMING APPT IN OCTOBER WITH DR. Angelena Form FOR FUTURE REFILLS. [place] [facility] Patient taking differently: Take 12.5 mg by  mouth at bedtime.  12/14/17  Yes Burnell Blanks, MD  nitroGLYCERIN (NITROSTAT) 0.4 MG SL tablet Place 1 tablet (0.4 mg total) under the tongue every 5 (five) minutes as needed for chest pain. 12/06/17  Yes Burnell Blanks, MD  POTASSIUM PO Take 1 tablet by mouth daily after lunch.   Yes [provider]    Physical Exam: Vitals:   12/17/17 1900 12/17/17 1945 12/17/17 1954 12/17/17 2000  BP: (!) 150/77 (!) 177/87 128/63 (!) 129/58  Pulse: 61 73 66 64  Resp: 17 16 12 11   Temp:      TempSrc:      SpO2: 96% 96% 96% 97%      Constitutional: Moderately built and nourished. Vitals:   12/17/17 1900 12/17/17 1945 12/17/17 1954 12/17/17 2000  BP: (!) 150/77 (!) 177/87 128/63 (!) 129/58  Pulse: 61 73 66 64  Resp: 17 16 12 11   Temp:      TempSrc:      SpO2: 96% 96% 96% 97%   Eyes: Anicteric no pallor. ENMT: No discharge from the ears eyes nose or mouth. Neck: No mass felt.  No JVD appreciated. Respiratory: No rhonchi or crepitations. Cardiovascular: S1-S2 heard no murmurs appreciated. Abdomen: Soft nontender bowel sounds present. Musculoskeletal: No edema.  No joint effusion. Skin: No rash. Neurologic: Alert awake oriented to time place and person.  Moves all extremities. Psychiatric: Appears normal per normal affect.   Labs on Admission: I have personally reviewed following labs and imaging studies  CBC: Recent Labs  Lab 12/17/17 1437  WBC 5.0  HGB 13.1  HCT 39.9  MCV 87.9  PLT 720   Basic Metabolic Panel: Recent Labs  Lab 12/17/17 1437  NA 141  K 3.5  CL 105  CO2 26  GLUCOSE 139*  BUN 9  CREATININE 0.81  CALCIUM 9.3   GFR: Estimated Creatinine Clearance: 72.2 mL/min (by C-G formula based on SCr of 0.81 mg/dL). Liver Function Tests: No results for input(s): AST, ALT, ALKPHOS, BILITOT, PROT, ALBUMIN in the last 168 hours. No results for input(s): LIPASE, AMYLASE in the last 168 hours. No results for input(s): AMMONIA in the last 168  hours. Coagulation Profile: No results for input(s): INR, PROTIME in the last 168 hours. Cardiac Enzymes: No results for input(s): CKTOTAL, CKMB, CKMBINDEX, TROPONINI in the last 168 hours. BNP (last 3 results) No results for input(s): PROBNP in the last 8760 hours. HbA1C: No results for input(s): HGBA1C in the last 72 hours. CBG: No results for input(s): GLUCAP in the last 168 hours. Lipid Profile: No results for input(s): CHOL, HDL, LDLCALC, TRIG, CHOLHDL, LDLDIRECT in the last 72 hours. Thyroid Function Tests: No results for input(s): TSH, T4TOTAL, FREET4, T3FREE, THYROIDAB in the last 72 hours. Anemia Panel: No  results for input(s): VITAMINB12, FOLATE, FERRITIN, TIBC, IRON, RETICCTPCT in the last 72 hours. Urine analysis: No results found for: COLORURINE, APPEARANCEUR, LABSPEC, PHURINE, GLUCOSEU, HGBUR, BILIRUBINUR, KETONESUR, PROTEINUR, UROBILINOGEN, NITRITE, LEUKOCYTESUR Sepsis Labs: @LABRCNTIP (procalcitonin:4,lacticidven:4) )No results found for this or any previous visit (from the past 240 hour(s)).   Radiological Exams on Admission: Dg Chest 2 View  Result Date: 12/17/2017 CLINICAL DATA:  Intermittent mid chest pain recently, worse today. Lightheadedness and nausea. EXAM: CHEST - 2 VIEW COMPARISON:  06/22/2012 FINDINGS: The cardiomediastinal silhouette is within normal limits. The lungs are well inflated and clear. There is no evidence of pleural effusion or pneumothorax. No acute osseous abnormality is identified. Right upper quadrant abdominal surgical clips are noted. IMPRESSION: No active cardiopulmonary disease. Electronically Signed   By: Logan Bores M.D.   On: 12/17/2017 15:42   Ct Angio Chest/abd/pel For Dissection W And/or Wo Contrast  Result Date: 12/17/2017 CLINICAL DATA:  Intermittent chest pain worsening today. Bilateral leg pain and cramping. EXAM: CT ANGIOGRAPHY CHEST, ABDOMEN AND PELVIS TECHNIQUE: Multidetector CT imaging through the chest, abdomen and pelvis  was performed using the standard protocol during bolus administration of intravenous contrast. Multiplanar reconstructed images and MIPs were obtained and reviewed to evaluate the vascular anatomy. CONTRAST:  190mL ISOVUE-370 IOPAMIDOL (ISOVUE-370) INJECTION 76% COMPARISON:  CT abdomen 04/23/2003 FINDINGS: CTA CHEST FINDINGS Cardiovascular: The initial noncontrast images demonstrate no acute intramural hematoma in the thoracic aorta. Minimal atherosclerotic calcification of the aortic arch and left anterior descending coronary artery. No dissection or aneurysm. Although today's exam was optimized for systemic arterial opacification, pulmonary arterial opacification is certainly adequate to exclude large or moderate-sized pulmonary embolus. No cardiomegaly or pericardial fluid. Mediastinum/Nodes: Unremarkable Lungs/Pleura: Mild biapical pleuroparenchymal scarring. Ground-glass density pulmonary nodule measuring 0.8 by 0.6 cm posteriorly in the left upper lobe on image 23/7. Densely calcified 5 mm granuloma in the left lower lobe on image 94/7. Musculoskeletal: Mild thoracic spondylosis. Review of the MIP images confirms the above findings. CTA ABDOMEN AND PELVIS FINDINGS VASCULAR Aorta: Aortoiliac atherosclerotic vascular disease. Celiac: The celiac trunk and its branches appear normal. SMA: Unremarkable Renals: Single bilateral renal arteries appear normal. IMA: Patent. Inflow: Minimal atherosclerotic vascular calcification in the common iliac arteries. Veins: Unremarkable Review of the MIP images confirms the above findings. NON-VASCULAR Hepatobiliary: Cholecystectomy. Mild hepatic steatosis with slight sparing along the gallbladder fossa. Otherwise unremarkable. Pancreas: Unremarkable Spleen: Unremarkable Adrenals/Urinary Tract: Numerous vascular calcifications adjacent to the distal ureters but no definite urinary tract calculi. No hydronephrosis. Urinary bladder normal. Left kidney lower pole peripelvic renal  cyst. Stomach/Bowel: Descending and sigmoid colon diverticulosis without active diverticulitis. Lymphatic: Unremarkable Reproductive: Uterus absent.  Adnexa unremarkable. Other: No supplemental non-categorized findings. Musculoskeletal: Disc bulge and intervertebral spurring at L1-2 without overt impingement. Review of the MIP images confirms the above findings. IMPRESSION: 1. No acute vascular findings. There is mild thoracic and abdominal aortic atherosclerotic vascular calcification along with faint punctate calcification in the left anterior descending coronary artery. 2. Ground-glass density pulmonary nodule in the left upper lobe measuring 0.7 cm in average diameter. Initial follow-up with CT at 6-12 months is recommended to confirm persistence. If persistent, repeat CT is recommended every 2 years until 5 years of stability has been established. This recommendation follows the consensus statement: Guidelines for Management of Incidental Pulmonary Nodules Detected on CT Images: From the Fleischner Society 2017; Radiology 2017; 284:228-243. 3. Mild thoracic spondylosis along with spurring and degenerative disc disease at L1-2, but without overt impingement. 4. Descending and sigmoid  colon diverticulosis. Electronically Signed   By: Van Clines M.D.   On: 12/17/2017 19:59    EKG: Independently reviewed.  Normal sinus rhythm.  Assessment/Plan Principal Problem:   Chest pain Active Problems:   Hypertension   Hyperlipemia    1. Chest pain he has a history of microvascular angina -we will cycle cardiac markers continue with PRN sublingual nitroglycerin and patient's home medications including Imdur and metoprolol. 2. Hypertension on amlodipine metoprolol.   DVT prophylaxis: Lovenox. Code Status: Full code. Family Communication: Discussed with patient. Disposition Plan: Home. Consults called: Cardiology. Admission status: Observation.   Rise Patience MD [facility] Pager  [phone].  If 7PM-7AM, please contact night-coverage [email or web address] Password [facility]  12/17/2017, 9:51 PM

## 2017-12-18 DIAGNOSIS — I208 Other forms of angina pectoris: Secondary | ICD-10-CM

## 2017-12-18 DIAGNOSIS — E7849 Other hyperlipidemia: Secondary | ICD-10-CM

## 2017-12-18 DIAGNOSIS — I25119 Atherosclerotic heart disease of native coronary artery with unspecified angina pectoris: Secondary | ICD-10-CM | POA: Diagnosis not present

## 2017-12-18 DIAGNOSIS — I10 Essential (primary) hypertension: Secondary | ICD-10-CM | POA: Diagnosis not present

## 2017-12-18 LAB — TROPONIN I
Troponin I: <0.03 ng/mL (ref <0.03)
Troponin I: <0.03 ng/mL (ref <0.03)

## 2017-12-18 LAB — HIV ANTIBODY (ROUTINE TESTING W REFLEX): HIV Screen 4th Generation wRfx: NONREACTIVE

## 2017-12-18 MED ORDER — ISOSORBIDE MONONITRATE ER 30 MG PO TB24: 30.0000 mg | ORAL_TABLET | Freq: Every day | ORAL | 0 refills | Status: DC

## 2017-12-18 MED ORDER — ISOSORBIDE MONONITRATE ER 30 MG PO TB24: 30.0000 mg | ORAL_TABLET | Freq: Every day | ORAL | Status: DC

## 2017-12-18 MED FILL — ISOSORBIDE MN ER 30 MG TAB: 30 | 30 days supply | Qty: 30 | Fill #0

## 2017-12-18 NOTE — Discharge Summary (Signed)
Physician Discharge Summary  [name] WER:[id number] DOB: 08-22-1950 DOA: 12/17/2017  PCP: Chesley Noon, MD  Admit date: 12/17/2017 Discharge date: 12/18/2017  Admitted From: home Discharge disposition: home   Recommendations for Outpatient Follow-Up:   1. imdur increased 2. There was an incidental finding up a pulmonary nodule in the left upper lobe measuring 0.7 cm in average diameter. Initial follow-up with CT at 6-12 months is recommended to confirm persistence. If persistent,repeat CT is recommended every 2 years until 5 years of stability has been established.   Discharge Diagnosis:   Principal Problem:   Microvascular angina [facility]) Active Problems:   Hypertension   Hyperlipemia   Chest pain    Discharge Condition: Improved.  Diet recommendation: Low sodium, heart healthy  Wound care: None.  Code status: Full.   History of Present Illness:   [name] is a 67 y.o. female with history of microvascular angina last cardiac cath in May 2017 was unremarkable presents with the ER because of chest pain.  Patient states over the last 2 weeks patient has been having increasing exertional chest pressure and which acutely worsened this afternoon.  The pain is mostly retrosternal radiating to the back this afternoon it also was associated some numbness in the lower extremity.  Had some diaphoresis and nausea.  Denies any abdominal pain shortness of breath or productive cough.   Hospital Course by Problem:   Angina -imdur increased to 30 mg  HTN -consider increased norvasc at next cardiology appointment -Imdur/toprol  HLD -does not tolerate statins  incidental lung nodule -see above recommendations for follow up    Medical Consultants:      Discharge Exam:   Vitals:   12/17/17 2324 12/18/17 0403  BP: (!) 143/76 137/69  Pulse: 65 (!) 56  Resp: 16 14  Temp: 98 F (36.7 C) 98.2 F (36.8 C)  SpO2: 96% 93%   Vitals:   12/17/17 2000 12/17/17 2153 12/17/17 2324 12/18/17 0403  BP: (!) 129/58 (!) 160/65 (!) 143/76 137/69  Pulse: 64 64 65 (!) 56  Resp: 11 14 16 14   Temp:  98.2 F (36.8 C) 98 F (36.7 C) 98.2 F (36.8 C)  TempSrc:  Oral Oral Oral  SpO2: 97% 96% 96% 93%  Weight:  84.9 kg    Height:  5' 4.5" (1.638 m)      General exam: Appears calm and comfortable  The results of significant diagnostics from this hospitalization (including imaging, microbiology, ancillary and laboratory) are listed below for reference.     Procedures and Diagnostic Studies:   Dg Chest 2 View  Result Date: 12/17/2017 CLINICAL DATA:  Intermittent mid chest pain recently, worse today. Lightheadedness and nausea. EXAM: CHEST - 2 VIEW COMPARISON:  06/22/2012 FINDINGS: The cardiomediastinal silhouette is within normal limits. The lungs are well inflated and clear. There is no evidence of pleural effusion or pneumothorax. No acute osseous abnormality is identified. Right upper quadrant abdominal surgical clips are noted. IMPRESSION: No active cardiopulmonary disease. Electronically Signed   By: Logan Bores M.D.   On: 12/17/2017 15:42   Ct Angio Chest/abd/pel For Dissection W And/or Wo Contrast  Result Date: 12/17/2017 CLINICAL DATA:  Intermittent chest pain worsening today. Bilateral leg pain and cramping. EXAM: CT ANGIOGRAPHY CHEST, ABDOMEN AND PELVIS TECHNIQUE: Multidetector CT imaging through the chest, abdomen and pelvis was performed using the standard protocol during bolus administration of intravenous contrast. Multiplanar reconstructed images and MIPs were obtained and reviewed to evaluate  the vascular anatomy. CONTRAST:  141mL ISOVUE-370 IOPAMIDOL (ISOVUE-370) INJECTION 76% COMPARISON:  CT abdomen 04/23/2003 FINDINGS: CTA CHEST FINDINGS Cardiovascular: The initial noncontrast images demonstrate no acute intramural hematoma in the thoracic aorta. Minimal atherosclerotic calcification of the aortic arch and left anterior  descending coronary artery. No dissection or aneurysm. Although today's exam was optimized for systemic arterial opacification, pulmonary arterial opacification is certainly adequate to exclude large or moderate-sized pulmonary embolus. No cardiomegaly or pericardial fluid. Mediastinum/Nodes: Unremarkable Lungs/Pleura: Mild biapical pleuroparenchymal scarring. Ground-glass density pulmonary nodule measuring 0.8 by 0.6 cm posteriorly in the left upper lobe on image 23/7. Densely calcified 5 mm granuloma in the left lower lobe on image 94/7. Musculoskeletal: Mild thoracic spondylosis. Review of the MIP images confirms the above findings. CTA ABDOMEN AND PELVIS FINDINGS VASCULAR Aorta: Aortoiliac atherosclerotic vascular disease. Celiac: The celiac trunk and its branches appear normal. SMA: Unremarkable Renals: Single bilateral renal arteries appear normal. IMA: Patent. Inflow: Minimal atherosclerotic vascular calcification in the common iliac arteries. Veins: Unremarkable Review of the MIP images confirms the above findings. NON-VASCULAR Hepatobiliary: Cholecystectomy. Mild hepatic steatosis with slight sparing along the gallbladder fossa. Otherwise unremarkable. Pancreas: Unremarkable Spleen: Unremarkable Adrenals/Urinary Tract: Numerous vascular calcifications adjacent to the distal ureters but no definite urinary tract calculi. No hydronephrosis. Urinary bladder normal. Left kidney lower pole peripelvic renal cyst. Stomach/Bowel: Descending and sigmoid colon diverticulosis without active diverticulitis. Lymphatic: Unremarkable Reproductive: Uterus absent.  Adnexa unremarkable. Other: No supplemental non-categorized findings. Musculoskeletal: Disc bulge and intervertebral spurring at L1-2 without overt impingement. Review of the MIP images confirms the above findings. IMPRESSION: 1. No acute vascular findings. There is mild thoracic and abdominal aortic atherosclerotic vascular calcification along with faint  punctate calcification in the left anterior descending coronary artery. 2. Ground-glass density pulmonary nodule in the left upper lobe measuring 0.7 cm in average diameter. Initial follow-up with CT at 6-12 months is recommended to confirm persistence. If persistent, repeat CT is recommended every 2 years until 5 years of stability has been established. This recommendation follows the consensus statement: Guidelines for Management of Incidental Pulmonary Nodules Detected on CT Images: From the Fleischner Society 2017; Radiology 2017; 284:228-243. 3. Mild thoracic spondylosis along with spurring and degenerative disc disease at L1-2, but without overt impingement. 4. Descending and sigmoid colon diverticulosis. Electronically Signed   By: Van Clines M.D.   On: 12/17/2017 19:59     Labs:   Basic Metabolic Panel: Recent Labs  Lab 12/17/17 1437 12/17/17 2240  NA 141  --   K 3.5  --   CL 105  --   CO2 26  --   GLUCOSE 139*  --   BUN 9  --   CREATININE 0.81 0.82  CALCIUM 9.3  --    GFR Estimated Creatinine Clearance: 71.9 mL/min (by C-G formula based on SCr of 0.82 mg/dL). Liver Function Tests: No results for input(s): AST, ALT, ALKPHOS, BILITOT, PROT, ALBUMIN in the last 168 hours. No results for input(s): LIPASE, AMYLASE in the last 168 hours. No results for input(s): AMMONIA in the last 168 hours. Coagulation profile No results for input(s): INR, PROTIME in the last 168 hours.  CBC: Recent Labs  Lab 12/17/17 1437 12/17/17 2240  WBC 5.0 5.3  HGB 13.1 12.9  HCT 39.9 38.9  MCV 87.9 87.0  PLT 234 208   Cardiac Enzymes: Recent Labs  Lab 12/17/17 2240 12/18/17 0403 12/18/17 0917  TROPONINI <0.03 <0.03 <0.03   BNP: Invalid input(s): POCBNP CBG: No results for input(s):  GLUCAP in the last 168 hours. D-Dimer No results for input(s): DDIMER in the last 72 hours. Hgb A1c No results for input(s): HGBA1C in the last 72 hours. Lipid Profile No results for input(s):  CHOL, HDL, LDLCALC, TRIG, CHOLHDL, LDLDIRECT in the last 72 hours. Thyroid function studies No results for input(s): TSH, T4TOTAL, T3FREE, THYROIDAB in the last 72 hours.  Invalid input(s): FREET3 Anemia work up No results for input(s): VITAMINB12, FOLATE, FERRITIN, TIBC, IRON, RETICCTPCT in the last 72 hours. Microbiology No results found for this or any previous visit (from the past 240 hour(s)).   Discharge Instructions:   Discharge Instructions    Diet - low sodium heart healthy   Complete by:  As directed    Discharge instructions   Complete by:  As directed    Changes: imdur increased to 30 mg from 15 mg--- in future if need, norvasc may been increased to 10 mg  There was an incidental finding up a pulmonary nodule in the left upper lobe measuring 0.7 cm in average diameter. Initial follow-up with CT at 6-12 months is recommended to confirm persistence. If persistent,repeat CT is recommended every 2 years until 5 years of stability has been established.   Increase activity slowly   Complete by:  As directed      Allergies as of 12/18/2017      Reactions   Adhesive [tape] Itching, Rash   Redness, Please use "paper" tape   Aspirin Diarrhea, Nausea Only   Can tolerate low dose coated aspirin   Lactose Intolerance (gi) Diarrhea   Potassium-containing Compounds Other (See Comments)   Indigestion      Medication List    STOP taking these medications   ibuprofen 200 MG tablet Commonly known as:  ADVIL,MOTRIN     TAKE these medications   amLODipine 5 MG tablet Commonly known as:  NORVASC TAKE 1 TABLET BY MOUTH DAILY. What changed:  when to take this   aspirin EC 81 MG tablet Take 81 mg by mouth at bedtime.   cetirizine 10 MG tablet Commonly known as:  ZYRTEC Take 10 mg by mouth daily.   isosorbide mononitrate 30 MG 24 hr tablet Commonly known as:  IMDUR Take 1 tablet (30 mg total) by mouth daily at 2 PM. What changed:    how much to take  when to take  this   MAGNESIUM PO Take 1 tablet by mouth daily after lunch.   metoprolol succinate 25 MG 24 hr tablet Commonly known as:  TOPROL-XL TAKE 1/2 TABLET BY MOUTH DAILY . PLEASE KEEP UPCOMING APPT IN OCTOBER WITH DR. Angelena Form FOR FUTURE REFILLS. THANK YOU What changed:  See the new instructions.   nitroGLYCERIN 0.4 MG SL tablet Commonly known as:  NITROSTAT Place 1 tablet (0.4 mg total) under the tongue every 5 (five) minutes as needed for chest pain.   POTASSIUM PO Take 1 tablet by mouth daily after lunch.      Follow-up Information    Chesley Noon, MD Follow up in 1 week(s).   Specialty:  Family Medicine Contact information: [address] [place] [address] [phone]        Burnell Blanks, MD Follow up in 1 month(s).   Specialty:  Cardiology Why:  you were seen in the hospital by Dr. Margaretann Loveless-- you will follow up with Dr. Mariella Saa information: [address] [place] [address] [phone]            Time  coordinating discharge: 35 min  Signed:  Geradine Girt DO  [facility] 12/18/2017, 11:47 AM

## 2017-12-18 NOTE — Consult Note (Addendum)
Cardiology Consultation:   Patient ID: [name]; [id number]; 22-Dec-1950   Admit date: 12/17/2017 Date of Consult: 12/18/2017  Primary Care Provider: Chesley Noon, MD Primary Cardiologist: Dr. Lauree Chandler, MD  Patient Profile:   [name] is a 67 y.o. female with a hx of chronic microvascular angina, HTN and HLD who is being seen today for the evaluation of pain at the request of Dr. Hal Hope.  History of Present Illness:   [name] 67 year old female with a history stated above who presented to [facility] on 12/17/2017 with complaints of sharp, severe chest pain which began approximately 2 weeks ago with exertional features and radiation to her back with associated lower extremity numbness and tingling and intermittent diaphoresis and nausea.  She reports she began feeling the symptoms approximately 2 weeks ago however while standing in her bathroom yesterday afternoon she reported an acute onset of sharp, jabbing pain which radiated directly through to her back with associated diaphoresis and nausea.  She has a long, 15-year history of known microvascular angina controlled with medical management.  She was last seen by her primary cardiologist and recently placed on isosorbide mononitrate given her symptoms.  She reports since that time, she was well controlled and noticed a decrease in her episodes until approximately 2 weeks ago. Given her symptoms, she reported to the ED for further evaluation.  She denies palpitations, dizziness, orthopnea symptoms, LE swelling, recent illness including fevers, chills, cough, presyncopal or syncopal episodes.  In the ED, patient was given SL NTG with resolution. CT angiogram to rule out dissection completed without aortic acute changes. There was evidence of groundglass density pulmonary nodule in left upper lobe measuring 0.7 cm with recommendations for follow-up in 6 to 12 months.  EKG was without ST-T wave abnormalities and no  acute ischemic changes.  Of note, she was last seen in follow-up on 12/06/2017.  Per chart review, she has a known history of microvascular angina with exertional features.  She underwent a cardiac cath in May 2017 with no evidence of CAD with normal LV function.  She was recently started on Imdur which was noted to have helped her chronic chest pain.  Past Medical History:  Diagnosis Date  . History of angina    probably microvascular angina  . Hx of cardiac cath    a.  LHC (3/02): Normal coronary arteries. Left ventricular function was normal  //  b. LHC 06/29/15 - normal coronary arteries, EF 60-65%  . Hx of cardiovascular stress test    a. 7/10 - normal, EF 72% // b. Myoview 4/14 - low risk, apical thinning, no ischemia, EF 80% //  c. ETT-Myoview (11/15):  Normal stress nuclear study. No ischemia.  LV Ejection Fraction: 75%  . Hypercholesteremia   . Hypertension   . Sinus problem   . Syncope 2008   syncopal episode, probably related to excessive stress, activity, and fatigue        Past Surgical History:  Procedure Laterality Date  . ABDOMINAL HYSTERECTOMY    . CARDIAC CATHETERIZATION  2002   normal  . CARDIAC CATHETERIZATION    . CARDIAC CATHETERIZATION N/A 06/29/2015   Procedure: Left Heart Cath and Coronary Angiography;  Surgeon: Larey Dresser, MD;  Location: [facility];  Service: Cardiovascular;  Laterality: N/A;  . CHOLECYSTECTOMY       Prior to Admission medications   Medication Sig Start Date End Date Taking? Authorizing Provider  amLODipine (NORVASC) 5 MG tablet  TAKE 1 TABLET BY MOUTH DAILY. Patient taking differently: Take 5 mg by mouth at bedtime.  10/01/17  Yes Burnell Blanks, MD  aspirin EC 81 MG tablet Take 81 mg by mouth at bedtime.    Yes [provider]  cetirizine (ZYRTEC) 10 MG tablet Take 10 mg by mouth daily.   Yes [provider]  ibuprofen (ADVIL,MOTRIN) 200 MG tablet Take 400 mg by mouth 2 (two) times daily as needed for  headache or moderate pain.   Yes [provider]  isosorbide mononitrate (IMDUR) 30 MG 24 hr tablet Take 0.5 tablets (15 mg total) by mouth daily. Patient taking differently: Take 15 mg by mouth daily at 2 PM.  12/19/16  Yes Weaver, Nicki Reaper T, PA-C  MAGNESIUM PO Take 1 tablet by mouth daily after lunch.   Yes [provider]  metoprolol succinate (TOPROL-XL) 25 MG 24 hr tablet TAKE 1/2 TABLET BY MOUTH DAILY . PLEASE KEEP UPCOMING APPT IN OCTOBER WITH DR. Angelena Form FOR FUTURE REFILLS. [place] YOU Patient taking differently: Take 12.5 mg by mouth at bedtime.  12/14/17  Yes Burnell Blanks, MD  nitroGLYCERIN (NITROSTAT) 0.4 MG SL tablet Place 1 tablet (0.4 mg total) under the tongue every 5 (five) minutes as needed for chest pain. 12/06/17  Yes Burnell Blanks, MD  POTASSIUM PO Take 1 tablet by mouth daily after lunch.   Yes [provider]    Inpatient Medications: Scheduled Meds: . amLODipine  5 mg Oral QHS  . aspirin EC  81 mg Oral QHS  . enoxaparin (LOVENOX) injection  40 mg Subcutaneous Q24H  . isosorbide mononitrate  15 mg Oral Q1400  . loratadine  10 mg Oral Daily  . magnesium oxide  200 mg Oral Daily  . metoprolol succinate  12.5 mg Oral QHS   Continuous Infusions:  PRN Meds: acetaminophen, nitroGLYCERIN, ondansetron (ZOFRAN) IV  Allergies:    Allergies  Allergen Reactions  . Adhesive [Tape] Itching and Rash    Redness, Please use "paper" tape   . Aspirin Diarrhea and Nausea Only    Can tolerate low dose coated aspirin  . Lactose Intolerance (Gi) Diarrhea  . Potassium-Containing Compounds Other (See Comments)    Indigestion    Social History:   Social History   Socioeconomic History  . Marital status: Married    Spouse name: Not on file  . Number of children: Not on file  . Years of education: Not on file  . Highest education level: Not on file  Occupational History  . Not on file  Social Needs  . Financial resource strain:  Not on file  . Food insecurity:    Worry: Not on file    Inability: Not on file  . Transportation needs:    Medical: Not on file    Non-medical: Not on file  Tobacco Use  . Smoking status: Never Smoker  . Smokeless tobacco: Never Used  Substance and Sexual Activity  . Alcohol use: No  . Drug use: No  . Sexual activity: Not on file  Lifestyle  . Physical activity:    Days per week: Not on file    Minutes per session: Not on file  . Stress: Not on file  Relationships  . Social connections:    Talks on phone: Not on file    Gets together: Not on file    Attends religious service: Not on file    Active member of club or organization: Not on file  Attends meetings of clubs or organizations: Not on file    Relationship status: Not on file  . Intimate partner violence:    Fear of current or ex partner: Not on file    Emotionally abused: Not on file    Physically abused: Not on file    Forced sexual activity: Not on file  Other Topics Concern  . Not on file  Social History Narrative  . Not on file    Family History:   Family History  Problem Relation Age of Onset  . Hypertension Father   . Heart attack Father 33  . Heart attack Mother        questionable  . Lung cancer Mother   . Cancer Mother   . Coronary artery disease Unknown        strong family history   . Stroke Maternal Grandfather   . Diabetes Maternal Grandfather    Family Status:  Family Status  Relation Name Status  . Father  Deceased at age 81       heart disease  . Mother  Deceased at age 73       questionable heart attack and had lung cancer  . Brother  Deceased       accident  . Daughter  Alive  . Daughter  Alive  . Daughter  Alive  . Unknown  (Not Specified)  . MGF  (Not Specified)    ROS:  Please see the history of present illness.  All other ROS reviewed and negative.     Physical Exam/Data:   Vitals:   12/17/17 2000 12/17/17 2153 12/17/17 2324 12/18/17 0403  BP: (!) 129/58 (!)  160/65 (!) 143/76 137/69  Pulse: 64 64 65 (!) 56  Resp: 11 14 16 14   Temp:  98.2 F (36.8 C) 98 F (36.7 C) 98.2 F (36.8 C)  TempSrc:  Oral Oral Oral  SpO2: 97% 96% 96% 93%  Weight:  84.9 kg    Height:  5' 4.5" (1.638 m)     No intake or output data in the 24 hours ending 12/18/17 0754 Filed Weights   12/17/17 2153  Weight: 84.9 kg   Body mass index is 31.63 kg/m.   General: Well developed, well nourished, NAD Skin: Warm, dry, intact  Head: Normocephalic, atraumatic, clear, moist mucus membranes. Neck: Negative for carotid bruits. No JVD Lungs:Clear to ausculation bilaterally. No wheezes, rales, or rhonchi. Breathing is unlabored. Cardiovascular: RRR with S1 S2. No murmurs, rubs, gallops, or LV heave appreciated. Abdomen: Soft, non-tender, non-distended with normoactive bowel sounds. No obvious abdominal masses. MSK: Strength and tone appear normal for age. 5/5 in all extremities Extremities: No edema. No clubbing or cyanosis. DP/PT pulses 2+ bilaterally Neuro: Alert and oriented. No focal deficits. No facial asymmetry. MAE spontaneously. Psych: Responds to questions appropriately with normal affect.     EKG:  The EKG was personally reviewed and demonstrates: 12/17/2017 NSR with no acute ischemic changes Telemetry:  Telemetry was personally reviewed and demonstrates: 12/18/2017 NSR HR 65  Relevant CV Studies:  ECHO: None  CATH: 06/29/2015: 1. No angiographic CAD.  2. Normal LV systolic function.   Patient's exertional chest pain is likely due to microvascular angina.  Will try her on low dose Imdur at 15 mg daily.  She had trouble with headache at higher doses in the past.   Lexiscan Myoview 12/23/2013: Normal  CTA 12/17/2017:  IMPRESSION: 1. No acute vascular findings. There is mild thoracic and abdominal aortic atherosclerotic vascular calcification  along with faint punctate calcification in the left anterior descending coronary artery. 2. Ground-glass density  pulmonary nodule in the left upper lobe measuring 0.7 cm in average diameter. Initial follow-up with CT at 6-12 months is recommended to confirm persistence. If persistent, repeat CT is recommended every 2 years until 5 years of stability has been established. This recommendation follows the consensus statement: Guidelines for Management of Incidental Pulmonary Nodules Detected on CT Images: From the Fleischner Society 2017; Radiology 2017; 284:228-243. 3. Mild thoracic spondylosis along with spurring and degenerative disc disease at L1-2, but without overt impingement. 4. Descending and sigmoid colon diverticulosis.  Laboratory Data:  Chemistry Recent Labs  Lab 12/17/17 1437 12/17/17 2240  NA 141  --   K 3.5  --   CL 105  --   CO2 26  --   GLUCOSE 139*  --   BUN 9  --   CREATININE 0.81 0.82  CALCIUM 9.3  --   GFRNONAA >60 >60  GFRAA >60 >60  ANIONGAP 10  --     Total Protein  Date Value Ref Range Status  01/24/2017 6.8 6.0 - 8.5 g/dL Final   Albumin  Date Value Ref Range Status  01/24/2017 4.6 3.6 - 4.8 g/dL Final   AST  Date Value Ref Range Status  01/24/2017 29 0 - 40 IU/L Final   ALT  Date Value Ref Range Status  01/24/2017 24 0 - 32 IU/L Final   Alkaline Phosphatase  Date Value Ref Range Status  01/24/2017 58 39 - 117 IU/L Final   Bilirubin Total  Date Value Ref Range Status  01/24/2017 0.3 0.0 - 1.2 mg/dL Final   Hematology Recent Labs  Lab 12/17/17 1437 12/17/17 2240  WBC 5.0 5.3  RBC 4.54 4.47  HGB 13.1 12.9  HCT 39.9 38.9  MCV 87.9 87.0  MCH 28.9 28.9  MCHC 32.8 33.2  RDW 12.6 12.6  PLT 234 208   Cardiac Enzymes Recent Labs  Lab 12/17/17 2240 12/18/17 0403  TROPONINI <0.03 <0.03    Recent Labs  Lab 12/17/17 1452 12/17/17 1916  TROPIPOC 0.00 0.00    BNPNo results for input(s): BNP, PROBNP in the last 168 hours.  DDimer No results for input(s): DDIMER in the last 168 hours. TSH:  Lab Results  Component Value Date   TSH  1.83 10/07/2014   Lipids: Lab Results  Component Value Date   CHOL 143 01/24/2017   HDL 57 01/24/2017   LDLCALC 65 01/24/2017   LDLDIRECT 102.3 10/17/2011   TRIG 107 01/24/2017   CHOLHDL 2.5 01/24/2017   HgbA1c:No results found for: HGBA1C  Radiology/Studies:  Dg Chest 2 View  Result Date: 12/17/2017 CLINICAL DATA:  Intermittent mid chest pain recently, worse today. Lightheadedness and nausea. EXAM: CHEST - 2 VIEW COMPARISON:  06/22/2012 FINDINGS: The cardiomediastinal silhouette is within normal limits. The lungs are well inflated and clear. There is no evidence of pleural effusion or pneumothorax. No acute osseous abnormality is identified. Right upper quadrant abdominal surgical clips are noted. IMPRESSION: No active cardiopulmonary disease. Electronically Signed   By: Logan Bores M.D.   On: 12/17/2017 15:42   Ct Angio Chest/abd/pel For Dissection W And/or Wo Contrast  Result Date: 12/17/2017 CLINICAL DATA:  Intermittent chest pain worsening today. Bilateral leg pain and cramping. EXAM: CT ANGIOGRAPHY CHEST, ABDOMEN AND PELVIS TECHNIQUE: Multidetector CT imaging through the chest, abdomen and pelvis was performed using the standard protocol during bolus administration of intravenous contrast. Multiplanar reconstructed images and MIPs  were obtained and reviewed to evaluate the vascular anatomy. CONTRAST:  161mL ISOVUE-370 IOPAMIDOL (ISOVUE-370) INJECTION 76% COMPARISON:  CT abdomen 04/23/2003 FINDINGS: CTA CHEST FINDINGS Cardiovascular: The initial noncontrast images demonstrate no acute intramural hematoma in the thoracic aorta. Minimal atherosclerotic calcification of the aortic arch and left anterior descending coronary artery. No dissection or aneurysm. Although today's exam was optimized for systemic arterial opacification, pulmonary arterial opacification is certainly adequate to exclude large or moderate-sized pulmonary embolus. No cardiomegaly or pericardial fluid.  Mediastinum/Nodes: Unremarkable Lungs/Pleura: Mild biapical pleuroparenchymal scarring. Ground-glass density pulmonary nodule measuring 0.8 by 0.6 cm posteriorly in the left upper lobe on image 23/7. Densely calcified 5 mm granuloma in the left lower lobe on image 94/7. Musculoskeletal: Mild thoracic spondylosis. Review of the MIP images confirms the above findings. CTA ABDOMEN AND PELVIS FINDINGS VASCULAR Aorta: Aortoiliac atherosclerotic vascular disease. Celiac: The celiac trunk and its branches appear normal. SMA: Unremarkable Renals: Single bilateral renal arteries appear normal. IMA: Patent. Inflow: Minimal atherosclerotic vascular calcification in the common iliac arteries. Veins: Unremarkable Review of the MIP images confirms the above findings. NON-VASCULAR Hepatobiliary: Cholecystectomy. Mild hepatic steatosis with slight sparing along the gallbladder fossa. Otherwise unremarkable. Pancreas: Unremarkable Spleen: Unremarkable Adrenals/Urinary Tract: Numerous vascular calcifications adjacent to the distal ureters but no definite urinary tract calculi. No hydronephrosis. Urinary bladder normal. Left kidney lower pole peripelvic renal cyst. Stomach/Bowel: Descending and sigmoid colon diverticulosis without active diverticulitis. Lymphatic: Unremarkable Reproductive: Uterus absent.  Adnexa unremarkable. Other: No supplemental non-categorized findings. Musculoskeletal: Disc bulge and intervertebral spurring at L1-2 without overt impingement. Review of the MIP images confirms the above findings. IMPRESSION: 1. No acute vascular findings. There is mild thoracic and abdominal aortic atherosclerotic vascular calcification along with faint punctate calcification in the left anterior descending coronary artery. 2. Ground-glass density pulmonary nodule in the left upper lobe measuring 0.7 cm in average diameter. Initial follow-up with CT at 6-12 months is recommended to confirm persistence. If persistent, repeat CT is  recommended every 2 years until 5 years of stability has been established. This recommendation follows the consensus statement: Guidelines for Management of Incidental Pulmonary Nodules Detected on CT Images: From the Fleischner Society 2017; Radiology 2017; 284:228-243. 3. Mild thoracic spondylosis along with spurring and degenerative disc disease at L1-2, but without overt impingement. 4. Descending and sigmoid colon diverticulosis. Electronically Signed   By: Van Clines M.D.   On: 12/17/2017 19:59    Assessment and Plan:   1.  History of microvascular angina presenting with chest pain: -Patient with known history of microvascular angina with exertional features for the last 15 years, last seen in follow-up 12/06/2017.  She underwent a cardiac cath 06/2015 with no evidence of CAD and normal LV function.  She was recently started on Imdur for her chronic angina. On 12/17/2017 presented to [facility] with acute onset of sharp, shooting chest pain with radiation to her back and associated mild shortness of breath, diaphoresis and nausea.   -EKG without acute ischemic changes, similar to prior tracings -Troponin, negative x2 -CTA completed rule out aortic dissection which was negative however, there was evidence of groundglass obesity pulmonary nodule in left upper lobe measuring 0.7 cm with recommendations for follow-up in 6 to 12 months. -No evidence of acute vascular findings. Mild thoracic and abdominal aortic atherosclerotic vascular calcification along with faint punctate calcification in the left anterior descending coronary artery on CTA  -Continue Toprol-XL, Imdur and amlodipine -Will increase Imdur to 30 mg daily. Consider up-titrating amlodipine to 10 mg  daily at follow up if anginal symptoms continue  -Given her negative cardiac work-up thus far and known history of microvascular angina would consider medication titration for now.  If troponins elevate or EKG changes will consider stress test  versus coronary CT for further evaluation.  2.  Pulmonary nodule: -CTA completed rule out aortic dissection which was negative however, there was evidence of groundglass obesity pulmonary nodule in left upper lobe measuring 0.7 cm with recommendations for follow-up in 6 to 12 months -Per primary team  3.  HTN: -Stable, 137/69, 143/76, 129/58 -Continue  4.  HLD: -Intolerable to statins  For questions or updates, please contact [place] Please consult [email or web address] for contact info under Cardiology/STEMI.   SignedKathyrn Drown NP-C [facility] Pager: [phone] 12/18/2017 7:54 AM ---------------------------------------------------------------------------------------------   History and all data above reviewed.  Patient examined.  I agree with the findings as above.  [name] is doing well today, has some chest, back and neck soreness but is otherwise without her presenting chest discomfort.  Constitutional: No acute distress Eyes: pupils equally round and reactive to light, sclera non-icteric, normal conjunctiva and lids ENMT: normal dentition, moist mucous membranes Cardiovascular: regular rhythm, normal rate, no murmurs. S1 and S2 normal. Radial pulses normal bilaterally. No jugular venous distention.  Respiratory: clear to auscultation bilaterally GI : normal bowel sounds, soft and nontender. No distention.   MSK: extremities warm, well perfused. No edema.  LYMPH: No lymphadenopathy noted of the head and neck NEURO: grossly nonfocal exam, moves all extremities. PSYCH: alert and oriented x 3, normal mood and affect.   All available labs, radiology testing, previous records reviewed. Agree with documented assessment and plan of my colleague as stated above with the following additions or changes:  Principal Problem:   Chest pain Active Problems:   Hypertension   Hyperlipemia   Plan:    Microvascular angina ([place]) - we will uptitrate her imdur today, and  recommend uptitration of amlodipine as an outpatient at cardiology follow up.     Hypertension - continue amlodipine 5 mg daily - Toprol-XL 12.5 mg daily - Imdur now 30 mg daily    Hyperlipemia, not on therapy due to myalgias with statins    Chest pain - does not represent ACS, likely her chronic microvascular dysfunction with angina.  [facility] will sign off.   Medication Recommendations:  Increasing imdur to 30 mg daily Other recommendations (labs, testing, etc):  N/a Follow up as an outpatient:  F/u 1 month cardiology   Elouise Munroe, MD [facility] 10:46 AM  12/18/2017

## 2017-12-20 ENCOUNTER — Encounter: Payer: Self-pay | Admitting: Physician Assistant

## 2017-12-31 ENCOUNTER — Other Ambulatory Visit: Payer: Self-pay | Admitting: Cardiovascular Disease

## 2017-12-31 MED FILL — AMLODIPINE BESYLATE 5 MG TA: 5 | 90 days supply | Qty: 90 | Fill #0

## 2018-01-14 ENCOUNTER — Ambulatory Visit: Payer: Medicare Other | Admitting: Physician Assistant

## 2018-01-14 MED FILL — METOPROLOL SUCCINATE ER 25: 25 | 30 days supply | Qty: 15 | Fill #1

## 2018-01-15 ENCOUNTER — Ambulatory Visit (INDEPENDENT_AMBULATORY_CARE_PROVIDER_SITE_OTHER): Payer: Medicare Other | Admitting: Neurology

## 2018-01-15 ENCOUNTER — Encounter: Payer: Self-pay | Admitting: Neurology

## 2018-01-15 VITALS — BP 142/75 | HR 81 | Ht 64.5 in | Wt 188.5 lb

## 2018-01-15 DIAGNOSIS — R252 Cramp and spasm: Secondary | ICD-10-CM

## 2018-01-15 DIAGNOSIS — M79606 Pain in leg, unspecified: Secondary | ICD-10-CM | POA: Diagnosis not present

## 2018-01-15 MED ORDER — GABAPENTIN 300 MG PO CAPS: 300.0000 mg | ORAL_CAPSULE | Freq: Three times a day (TID) | ORAL | 11 refills | Status: DC

## 2018-01-15 MED FILL — GABAPENTIN 300 MG CAPSULE: 300 | 30 days supply | Qty: 90 | Fill #0

## 2018-01-15 NOTE — Progress Notes (Signed)
PATIENT: [name] DOB: 01-25-1951  Chief Complaint  Patient presents with  . Pain    She is here with her husband, Eddie Dibbles. Reports intermittent pain, electical shocking sensations, muscle weakness and fatigue.  She has had multiple episodes with these symptoms.  They occurred the first time in 2005.  She has experienced them both her upper extremities and lower extremities.   Marland Kitchen PCP    Chesley Noon, MD     HISTORICAL  [name] is a 67 year old female is accompanied by her husband Eddie Dibbles, seen in request by her primary care physician Dr. Melford Aase, [place] for evaluation of pain, electric shocking sensation, muscle weakness and fatigue, initial evaluation was on January 15, 2018.  I have reviewed and summarized the referring note from the referring physician.  She had a past medical history of hypertension,  She presented intermittent muscle spasms since 2005, increased frequency since 2019, had one spell in January 2019, prolonged most severe spell in October 2019, all episodes are similar, mostly involving bilateral lower extremity muscles, sudden onset bilateral lower extremity muscle spasm from the waist down, painful, difficulty bearing weight, she has to sit down, spell last 5 minutes, no loss of consciousness, could not walking during the spells, felt weak, fatigued afterwards, in October 2019, she had a 6 prolonged episode each episode last about few minutes, sometimes woke her up from sleep, noticed some low back pain, radiating pain to right hip, she is now recovered, occasionally muscle spasm involving the neck pain, radiating pain to bilateral shoulder, bilateral upper extremity.  She denies significant gait abnormality, denies bowel and bladder incontinence.   Laboratory evaluations December 18, 2017, negative troponin, normal CBC, BMP with exception of mild elevated glucose 139, negative HIV, Symptoms since 2005  Laboratory evaluations in Jan 2019, CPK 335,    REVIEW OF SYSTEMS: Full 14 system review of systems performed and notable only for weight gain, fatigue, chest pain, weakness, flushing, diarrhea, constipation, cramps, aching muscles, joints pain, not enough sleep, racing thoughts. All other review of systems were negative.  ALLERGIES: Allergies  Allergen Reactions  . Adhesive [Tape] Itching and Rash    Redness, Please use "paper" tape   . Aspirin Diarrhea and Nausea Only    Can tolerate low dose coated aspirin  . Lactose Intolerance (Gi) Diarrhea  . Potassium-Containing Compounds Other (See Comments)    Indigestion    HOME MEDICATIONS: Current Outpatient Medications  Medication Sig Dispense Refill  . amLODipine (NORVASC) 5 MG tablet TAKE 1 TABLET BY MOUTH DAILY. 90 tablet 3  . aspirin EC 81 MG tablet Take 81 mg by mouth at bedtime.     . isosorbide mononitrate (IMDUR) 30 MG 24 hr tablet Take 1 tablet (30 mg total) by mouth daily at 2 PM. 30 tablet 0  . MAGNESIUM PO Take 200 mg by mouth 2 (two) times daily.     . metoprolol succinate (TOPROL-XL) 25 MG 24 hr tablet TAKE 1/2 TABLET BY MOUTH DAILY . PLEASE KEEP UPCOMING APPT IN OCTOBER WITH DR. Angelena Form FOR FUTURE REFILLS. THANK YOU (Patient taking differently: Take 12.5 mg by mouth at bedtime. ) 15 tablet 11  . nitroGLYCERIN (NITROSTAT) 0.4 MG SL tablet Place 1 tablet (0.4 mg total) under the tongue every 5 (five) minutes as needed for chest pain. 25 tablet 6   No current facility-administered medications for this visit.     PAST MEDICAL HISTORY: Past Medical History:  Diagnosis Date  . Basal cell carcinoma   .  Coronary artery disease   . History of angina    probably microvascular angina  . Hx of cardiac cath    a.  LHC (3/02): Normal coronary arteries. Left ventricular function was normal  //  b. LHC 06/29/15 - normal coronary arteries, EF 60-65%  . Hx of cardiovascular stress test    a. 7/10 - normal, EF 72% // b. Myoview 4/14 - low risk, apical thinning, no ischemia, EF  80% //  c. ETT-Myoview (11/15):  Normal stress nuclear study. No ischemia.  LV Ejection Fraction: 75%  . Hypercholesteremia   . Hypertension   . Sinus problem   . Skin cancer    basal cell, squamous cell  . Syncope 2008   syncopal episode, probably related to excessive stress, activity, and fatigue        PAST SURGICAL HISTORY: Past Surgical History:  Procedure Laterality Date  . ABDOMINAL HYSTERECTOMY    . BASAL CELL CARCINOMA EXCISION    . CARDIAC CATHETERIZATION  2002   normal  . CARDIAC CATHETERIZATION    . CARDIAC CATHETERIZATION N/A 06/29/2015   Procedure: Left Heart Cath and Coronary Angiography;  Surgeon: Larey Dresser, MD;  Location: [facility];  Service: Cardiovascular;  Laterality: N/A;  . CESAREAN SECTION    . CHOLECYSTECTOMY    . SQUAMOUS CELL CARCINOMA EXCISION      FAMILY HISTORY: Family History  Problem Relation Age of Onset  . Hypertension Father   . Heart attack Father 73  . Heart attack Mother        questionable  . Lung cancer Mother   . Coronary artery disease Unknown        strong family history   . Stroke Maternal Grandfather   . Diabetes Maternal Grandfather     SOCIAL HISTORY: Social History   Socioeconomic History  . Marital status: Married    Spouse name: Not on file  . Number of children: 3  . Years of education: one year college  . Highest education level: Not on file  Occupational History  . Occupation: Network engineer at [employer]  . Financial resource strain: Not on file  . Food insecurity:    Worry: Not on file    Inability: Not on file  . Transportation needs:    Medical: Not on file    Non-medical: Not on file  Tobacco Use  . Smoking status: Never Smoker  . Smokeless tobacco: Never Used  Substance and Sexual Activity  . Alcohol use: No  . Drug use: No  . Sexual activity: Not on file  Lifestyle  . Physical activity:    Days per week: Not on file    Minutes per session: Not on file  . Stress: Not on  file  Relationships  . Social connections:    Talks on phone: Not on file    Gets together: Not on file    Attends religious service: Not on file    Active member of club or organization: Not on file    Attends meetings of clubs or organizations: Not on file    Relationship status: Not on file  . Intimate partner violence:    Fear of current or ex partner: Not on file    Emotionally abused: Not on file    Physically abused: Not on file    Forced sexual activity: Not on file  Other Topics Concern  . Not on file  Social History Narrative   Lives at home with  husband.   Right-handed.   1 cup caffeine per day.     PHYSICAL EXAM   Vitals:   01/15/18 1247  BP: (!) 142/75  Pulse: 81  Weight: 188 lb 8 oz (85.5 kg)  Height: 5' 4.5" (1.638 m)    Not recorded      Body mass index is 31.86 kg/m.  PHYSICAL EXAMNIATION:  Gen: NAD, conversant, well nourised, obese, well groomed                     Cardiovascular: Regular rate rhythm, no peripheral edema, warm, nontender. Eyes: Conjunctivae clear without exudates or hemorrhage Neck: Supple, no carotid bruits. Pulmonary: Clear to auscultation bilaterally   NEUROLOGICAL EXAM:  MENTAL STATUS: Speech:    Speech is normal; fluent and spontaneous with normal comprehension.  Cognition:     Orientation to time, place and person     Normal recent and remote memory     Normal Attention span and concentration     Normal Language, naming, repeating,spontaneous speech     Fund of knowledge   CRANIAL NERVES: CN II: Visual fields are full to confrontation. Fundoscopic exam is normal with sharp discs and no vascular changes. Pupils are round equal and briskly reactive to light. CN III, IV, VI: extraocular movement are normal. No ptosis. CN V: Facial sensation is intact to pinprick in all 3 divisions bilaterally. Corneal responses are intact.  CN VII: Face is symmetric with normal eye closure and smile. CN VIII: Hearing is normal to  rubbing fingers CN IX, X: Palate elevates symmetrically. Phonation is normal. CN XI: Head turning and shoulder shrug are intact CN XII: Tongue is midline with normal movements and no atrophy.  MOTOR: There is no pronator drift of out-stretched arms. Muscle bulk and tone are normal. Muscle strength is normal.  REFLEXES: Reflexes are 2+ and symmetric at the biceps, triceps, knees, and ankles. Plantar responses are flexor.  SENSORY: Intact to light touch, pinprick, positional sensation and vibratory sensation are intact in fingers and toes.  COORDINATION: Rapid alternating movements and fine finger movements are intact. There is no dysmetria on finger-to-nose and heel-knee-shin.    GAIT/STANCE: Posture is normal. Gait is steady with normal steps, base, arm swing, and turning. Heel and toe walking are normal. Tandem gait is normal.  Romberg is absent.   DIAGNOSTIC DATA (LABS, IMAGING, TESTING) - I reviewed patient records, labs, notes, testing and imaging myself where available.   ASSESSMENT AND PLAN  DEEANNE DEININGER is a 67 y.o. female   Frequent muscle spasm, chronic low back pain  Hyperreflexia on examination  Potential localization cervical spondylitic myelopathy, lumbar radiculopathy  Proceed with MRI of cervical spine, lumbar spine  Gabapentin 300 mg 3 times a day  Get laboratory evaluation from her recent rheumatologist evaluation Dr. Ginette Otto, M.D. Ph.D.  [facility] [address] [place], [place] [address] Ph: [phone] Fax: [phone]  CC: Chesley Noon, MD

## 2018-01-16 ENCOUNTER — Telehealth: Payer: Self-pay | Admitting: Neurology

## 2018-01-16 MED ORDER — GABAPENTIN 100 MG PO CAPS: 100.0000 mg | ORAL_CAPSULE | Freq: Three times a day (TID) | ORAL | 3 refills | Status: DC

## 2018-01-16 MED FILL — GABAPENTIN 100 MG CAPS: 100 | 30 days supply | Qty: 90 | Fill #0

## 2018-01-16 NOTE — Telephone Encounter (Signed)
I called the patient.  The patient is not able to tolerate the 300 mg dosing of the gabapentin, we will start with 100 mg of gabapentin twice daily for a few days and then try to go to 1 capsule 3 times daily.

## 2018-01-16 NOTE — Telephone Encounter (Signed)
Left message requesting a return call.

## 2018-01-16 NOTE — Telephone Encounter (Addendum)
Spoke to patient - states she is unable tolerate gabapentin 300mg .  She took one capsule at 10:30pm last night and did not wake up until 9:30am this morning.  She still has residual drowsiness now. Says there is no way she would be able to use this dose during the day.  Says she is often sensitive to medications.  She would like to try a lower dose of gabapentin or an alternate medication.

## 2018-01-16 NOTE — Telephone Encounter (Signed)
Pt called stating she took one tablet of gabapentin (NEURONTIN) 300 MG capsule last night around 10:30pm and woke around 9:30am. Stating she will not be to take 3 tablets daily please advise.

## 2018-02-01 ENCOUNTER — Ambulatory Visit
Admission: RE | Admit: 2018-02-01 | Discharge: 2018-02-01 | Disposition: A | Discharge status: Home / Self Care | Payer: Medicare Other | Source: Ambulatory Visit | Attending: Neurology | Admitting: Neurology

## 2018-02-01 DIAGNOSIS — R252 Cramp and spasm: Secondary | ICD-10-CM

## 2018-02-01 DIAGNOSIS — M79606 Pain in leg, unspecified: Secondary | ICD-10-CM

## 2018-02-04 ENCOUNTER — Telehealth: Payer: Self-pay | Admitting: Neurology

## 2018-02-04 NOTE — Telephone Encounter (Signed)
Spoke to patient to notify her of the MRI results.  She verbalized understanding.  She will keep her pending follow up for further review.

## 2018-02-04 NOTE — Telephone Encounter (Signed)
Please call patient, MRI lumbar spine showed mild degenerative changes, with variable degree of foraminal narrowing,  MRI of cervical spine also showed multilevel degenerative changes, most severe at C4-5, moderately severe left foraminal narrowing,  I will review MRIs with her at next follow-up visit  IMPRESSION: This MRI of the lumbar spine shows multilevel degenerative changes as detailed above.  Most significant findings are: 1.    At L1-L2,  there is a left paramedian/lateral disc herniation that could lead to left L2 nerve root compression as it traverses the lateral recess. 2.    At L2-L3 there is a left paramedian disc protrusion but no nerve root compression. 3.    At L4-L5, there are degenerative changes causing borderline spinal stenosis and moderate right lateral recess stenosis but no nerve root compression. 4.    At L5-S1, there are degenerative changes causing moderate right foraminal narrowing but no nerve root compression.   IMPRESSION: This MRI of the cervical spine without contrast shows multilevel degenerative changes as detailed above.  No significant findings are: 1.    At C4-C5 there is a large left disc osteophyte complex causing moderately severe left foraminal narrowing that could lead to left C5 nerve root compression. 2.    At C5-C6, there are degenerative changes causing moderate right foraminal narrowing and mild spinal stenosis but no nerve root compression. 3.    At C6-C7, there are degenerative changes, more to the right that could lead to right C7 nerve root compression.  There is also mild spinal stenosis.

## 2018-02-04 NOTE — Telephone Encounter (Signed)
See previous phone notes for MRI report

## 2018-02-05 ENCOUNTER — Encounter: Payer: Self-pay | Admitting: Physician Assistant

## 2018-02-05 NOTE — Progress Notes (Signed)
Cardiology Office Note    Date:  02/06/2018  ID:  [name], DOB 08/19/1950, MRN [id number] PCP:  Chesley Noon, MD  Cardiologist:  Lauree Chandler, MD   Chief Complaint: f/u chest pain  History of Present Illness:  [name] is a 67 y.o. female with history of chronic microvascular angina, HTN, HLD (prior myalgias with statins, pulmonary nodule by CT 11/2017 who presents for post-hospital follow-up.   She has a longstanding history of exertional chest pain, previously followed by Dr. Doreatha Lew, Andreas Newport and more recently Dr. Angelena Form. Cardiac cath for these symptoms 06/2015 showing no evidence of CAD and normal LV function. Her chest discomfort has been presumed due to microvascular angina. She was previously started on Imdur with improvement in her chest pain. She was recently admitted 11/2017 with sharp, severe chest pain x 2 weeks with exertional features and radiation to her back. She also had associated lower extremity numbness and tingling and intermittent diaphoresis and nausea. In the ED, patient was given SL NTG with resolution. CT angiogram to rule out dissection completed without aortic acute changes. There was evidence of groundglass density pulmonary nodule in left upper lobe measuring 0.7 cm with recommendations for follow-up in 6 to 12 months. EKG was without ST-T wave abnormalities and no acute ischemic changes. Troponins were negative. Imdur was increased with plan to consider increasing amlodipine as outpatient if needed. Otherwise labs showed Hgb 12.9, K 3.5, glucose 139; 01/2017 LDL was 65 and LFTs wnl. She previously had leg cramps that did not really resolve off statin so Crestor was resumed in 10/2016, with resultant lipid profile as above. She has since discontinued this as she she is being followed by neurology for intermittent pain/muscle spasms, electrical shocking sensations, muscle weakness and fatigue. She is also being seen by rheumatology for  this. It sounds like the diagnosis is elusive so far, possibly fibromyalgia but unclear.  She returns for post-hospital follow-up overall doing well from angina standpoint. She did not feel well with the Imdur 30mg  dose so went back to 15mg  daily and feels her exertional angina has been controlled since that time. She does not wish to make any med changes today. Her chronic DOE is stable. She feels the electric shock type pain is different than her longstanding angina. She took a week off and went to the beach to decompress and felt much better coming home.     Past Medical History:  Diagnosis Date  . Basal cell carcinoma   . Hx of cardiac cath    a.  LHC (3/02): Normal coronary arteries. Left ventricular function was normal  //  b. LHC 06/29/15 - normal coronary arteries, EF 60-65%  . Hx of cardiovascular stress test    a. 7/10 - normal, EF 72% // b. Myoview 4/14 - low risk, apical thinning, no ischemia, EF 80% //  c. ETT-Myoview (11/15):  Normal stress nuclear study. No ischemia.  LV Ejection Fraction: 75%  . Hypercholesteremia   . Hypertension   . Microvascular angina ([place])    a. possibly - normal cath 2017.  . Muscle cramp   . Pulmonary nodule    a. by CT 11/2017 with recommendation for f/u 6-12 mo  . Sinus problem   . Skin cancer    basal cell, squamous cell  . Syncope 2008   syncopal episode, probably related to excessive stress, activity, and fatigue        Past Surgical History:  Procedure Laterality Date  .  ABDOMINAL HYSTERECTOMY    . BASAL CELL CARCINOMA EXCISION    . CARDIAC CATHETERIZATION  2002   normal  . CARDIAC CATHETERIZATION    . CARDIAC CATHETERIZATION N/A 06/29/2015   Procedure: Left Heart Cath and Coronary Angiography;  Surgeon: Larey Dresser, MD;  Location: [facility];  Service: Cardiovascular;  Laterality: N/A;  . CESAREAN SECTION    . CHOLECYSTECTOMY    . SQUAMOUS CELL CARCINOMA EXCISION      Current Medications: Current Meds  Medication Sig   . amLODipine (NORVASC) 5 MG tablet TAKE 1 TABLET BY MOUTH DAILY.  Marland Kitchen aspirin EC 81 MG tablet Take 81 mg by mouth at bedtime.   . gabapentin (NEURONTIN) 100 MG capsule Take 1 capsule (100 mg total) by mouth 3 (three) times daily.  . isosorbide mononitrate (IMDUR) 30 MG 24 hr tablet Take 1 tablet (30 mg total) by mouth daily at 2 PM.  . MAGNESIUM PO Take 200 mg by mouth 2 (two) times daily.   . metoprolol succinate (TOPROL-XL) 25 MG 24 hr tablet TAKE 1/2 TABLET BY MOUTH DAILY . PLEASE KEEP UPCOMING APPT IN OCTOBER WITH DR. Angelena Form FOR FUTURE REFILLS. THANK YOU  . nitroGLYCERIN (NITROSTAT) 0.4 MG SL tablet Place 1 tablet (0.4 mg total) under the tongue every 5 (five) minutes as needed for chest pain.      Allergies:   Adhesive [tape]; Aspirin; Lactose intolerance (gi); and Potassium-containing compounds   Social History   Socioeconomic History  . Marital status: Married    Spouse name: Not on file  . Number of children: 3  . Years of education: one year college  . Highest education level: Not on file  Occupational History  . Occupation: Network engineer at [employer]  . Financial resource strain: Not on file  . Food insecurity:    Worry: Not on file    Inability: Not on file  . Transportation needs:    Medical: Not on file    Non-medical: Not on file  Tobacco Use  . Smoking status: Never Smoker  . Smokeless tobacco: Never Used  Substance and Sexual Activity  . Alcohol use: No  . Drug use: No  . Sexual activity: Not on file  Lifestyle  . Physical activity:    Days per week: Not on file    Minutes per session: Not on file  . Stress: Not on file  Relationships  . Social connections:    Talks on phone: Not on file    Gets together: Not on file    Attends religious service: Not on file    Active member of club or organization: Not on file    Attends meetings of clubs or organizations: Not on file    Relationship status: Not on file  Other Topics Concern  . Not on file    Social History Narrative   Lives at home with husband.   Right-handed.   1 cup caffeine per day.     Family History:  The patient's family history includes Coronary artery disease in her unknown relative; Diabetes in her maternal grandfather; Heart attack in her mother; Heart attack (age of onset: 20) in her father; Hypertension in her father; Lung cancer in her mother; Stroke in her maternal grandfather.  ROS:   Please see the history of present illness.  All other systems are reviewed and otherwise negative.    PHYSICAL EXAM:   VS:  BP 130/78   Pulse (!) 59   Ht  5' 4.5" (1.638 m)   Wt 186 lb 12.8 oz (84.7 kg)   SpO2 93%   BMI 31.57 kg/m   BMI: Body mass index is 31.57 kg/m. GEN: Well nourished, well developed WF, in no acute distress HEENT: normocephalic, atraumatic Neck: no JVD, carotid bruits, or masses Cardiac: RRR; no murmurs, rubs, or gallops, no edema  Respiratory:  clear to auscultation bilaterally, normal work of breathing GI: soft, nontender, nondistended, + BS MS: no deformity or atrophy Skin: warm and dry, no rash Neuro:  Alert and Oriented x 3, Strength and sensation are intact, follows commands Psych: euthymic mood, full affect  Wt Readings from Last 3 Encounters:  02/06/18 186 lb 12.8 oz (84.7 kg)  01/15/18 188 lb 8 oz (85.5 kg)  12/17/17 187 lb 2.7 oz (84.9 kg)      Studies/Labs Reviewed:   EKG:   EKG was not ordered today.  Recent Labs: 12/17/2017: BUN 9; Creatinine, Ser 0.82; Hemoglobin 12.9; Platelets 208; Potassium 3.5; Sodium 141   Lipid Panel    Component Value Date/Time   CHOL 143 01/24/2017 0856   TRIG 107 01/24/2017 0856   HDL 57 01/24/2017 0856   CHOLHDL 2.5 01/24/2017 0856   CHOLHDL 3 10/07/2014 1055   VLDL 25.8 10/07/2014 1055   LDLCALC 65 01/24/2017 0856   LDLDIRECT 102.3 10/17/2011 0941    Additional studies/ records that were reviewed today include: Summarized above    ASSESSMENT & PLAN:   1. H/o exertional angina  as well as atypical chest pain - her exertional angina goes back many years with prior negative cath and stress testing, presumed due to microvascular angina. She did not feel well on Imdur 30mg  so went back down to 15mg  daily and feels like she is currently doing well on this regimen. We discussed that possibly increasing amlodipine in the future would be an option. The electric-shock type pain does not sound related to ischemia and workup is ongoing with neuro/rheum for this. I do not see a prior echo on file and I do feel this would be helpful to exclude atypical forms of hypertrophic cardiomyopathy contributing to her symptoms. 2. Microvascular angina - as above. 3. HTN - controlled on present regimen. Did not tolerate increase in Imdur. 4. Hyperlipidemia - we discussed resumption of statin but at this time she is understandably hesitant to add back another medicine to the mix while her muscle weakness and shock pain is being evaluated. This can be revisited at follow-up or with primary care.  Disposition: F/u with Dr. Angelena Form in 6 months. We also discussed her pulm nodule on CT and I asked her to discuss further monitoring with PCP.  Medication Adjustments/Labs and Tests Ordered: Current medicines are reviewed at length with the patient today.  Concerns regarding medicines are outlined above. Medication changes, Labs and Tests ordered today are summarized above and listed in the Patient Instructions accessible in Encounters.   Signed, Charlie Pitter, PA-C  02/06/2018 10:03 AM    [facility] [address], [place], [place]  [address] Phone: [phone]; Fax: [phone]

## 2018-02-06 ENCOUNTER — Encounter: Payer: Self-pay | Admitting: Physician Assistant

## 2018-02-06 ENCOUNTER — Ambulatory Visit (INDEPENDENT_AMBULATORY_CARE_PROVIDER_SITE_OTHER): Payer: Medicare Other | Admitting: Physician Assistant

## 2018-02-06 VITALS — BP 130/78 | HR 59 | Ht 64.5 in | Wt 186.8 lb

## 2018-02-06 DIAGNOSIS — I209 Angina pectoris, unspecified: Secondary | ICD-10-CM

## 2018-02-06 DIAGNOSIS — I10 Essential (primary) hypertension: Secondary | ICD-10-CM

## 2018-02-06 DIAGNOSIS — E785 Hyperlipidemia, unspecified: Secondary | ICD-10-CM | POA: Diagnosis not present

## 2018-02-06 DIAGNOSIS — I208 Other forms of angina pectoris: Secondary | ICD-10-CM

## 2018-02-06 MED ORDER — ISOSORBIDE MONONITRATE ER 30 MG PO TB24: 15.0000 mg | ORAL_TABLET | Freq: Every day | ORAL | 3 refills | Status: DC

## 2018-02-06 MED ORDER — NITROGLYCERIN 0.4 MG SL SUBL: 0.4000 mg | SUBLINGUAL_TABLET | SUBLINGUAL | 6 refills | Status: DC | PRN

## 2018-02-06 MED FILL — NITROGLYCERIN 0.4 MG TAB SL: 0.4 | 30 days supply | Qty: 25 | Fill #0

## 2018-02-06 MED FILL — ISOSORBIDE MN ER 30 MG TAB: 30 | 90 days supply | Qty: 45 | Fill #0

## 2018-02-06 NOTE — Patient Instructions (Signed)
Medication Instructions:  Your physician recommends that you continue on your current medications as directed. Please refer to the Current Medication list given to you today.  If you need a refill on your cardiac medications before your next appointment, please call your pharmacy.   Lab work: None ordered  If you have labs (blood work) drawn today and your tests are completely normal, you will receive your results only by: Marland Kitchen MyChart Message (if you have MyChart) OR . A paper copy in the mail If you have any lab test that is abnormal or we need to change your treatment, we will call you to review the results.  Testing/Procedures: Your physician has requested that you have an echocardiogram. Echocardiography is a painless test that uses sound waves to create images of your heart. It provides your doctor with information about the size and shape of your heart and how well your heart's chambers and valves are working. This procedure takes approximately one hour. There are no restrictions for this procedure.    Follow-Up: At [facility], you and your health needs are our priority.  As part of our continuing mission to provide you with exceptional heart care, we have created designated Provider Care Teams.  These Care Teams include your primary Cardiologist (physician) and Advanced Practice Providers (APPs -  Physician Assistants and Nurse Practitioners) who all work together to provide you with the care you need, when you need it. You will need a follow up appointment in 6 months.  Please call our office 2 months in advance to schedule this appointment.  You may see Lauree Chandler, MD or one of the following Advanced Practice Providers on your designated Care Team:   Douglas, PA-C Melina Copa, PA-C . Ermalinda Barrios, PA-C  Any Other Special Instructions Will Be Listed Below (If Applicable).  Echocardiogram An echocardiogram is a procedure that uses painless sound waves  (ultrasound) to produce an image of the heart. Images from an echocardiogram can provide important information about:  Signs of coronary artery disease (CAD).  Aneurysm detection. An aneurysm is a weak or damaged part of an artery wall that bulges out from the normal force of blood pumping through the body.  Heart size and shape. Changes in the size or shape of the heart can be associated with certain conditions, including heart failure, aneurysm, and CAD.  Heart muscle function.  Heart valve function.  Signs of a past heart attack.  Fluid buildup around the heart.  Thickening of the heart muscle.  A tumor or infectious growth around the heart valves. Tell a health care provider about:  Any allergies you have.  All medicines you are taking, including vitamins, herbs, eye drops, creams, and over-the-counter medicines.  Any blood disorders you have.  Any surgeries you have had.  Any medical conditions you have.  Whether you are pregnant or may be pregnant. What are the risks? Generally, this is a safe procedure. However, problems may occur, including:  Allergic reaction to dye (contrast) that may be used during the procedure. What happens before the procedure? No specific preparation is needed. You may eat and drink normally. What happens during the procedure?   An IV tube may be inserted into one of your veins.  You may receive contrast through this tube. A contrast is an injection that improves the quality of the pictures from your heart.  A gel will be applied to your chest.  A wand-like tool (transducer) will be moved over your chest. The  gel will help to transmit the sound waves from the transducer.  The sound waves will harmlessly bounce off of your heart to allow the heart images to be captured in real-time motion. The images will be recorded on a computer. The procedure may vary among health care providers and hospitals. What happens after the  procedure?  You may return to your normal, everyday life, including diet, activities, and medicines, unless your health care provider tells you not to do that. Summary  An echocardiogram is a procedure that uses painless sound waves (ultrasound) to produce an image of the heart.  Images from an echocardiogram can provide important information about the size and shape of your heart, heart muscle function, heart valve function, and fluid buildup around your heart.  You do not need to do anything to prepare before this procedure. You may eat and drink normally.  After the echocardiogram is completed, you may return to your normal, everyday life, unless your health care provider tells you not to do that. This information is not intended to replace advice given to you by your health care provider. Make sure you discuss any questions you have with your health care provider. Document Released: 02/04/2000 Document Revised: 03/11/2016 Document Reviewed: 03/11/2016 Elsevier Interactive Patient Education  2019 [employer].

## 2018-02-14 MED FILL — METOPROLOL SUCCINATE ER 25: 25 | 30 days supply | Qty: 15 | Fill #2

## 2018-02-26 ENCOUNTER — Other Ambulatory Visit: Payer: Self-pay

## 2018-02-26 ENCOUNTER — Ambulatory Visit (HOSPITAL_COMMUNITY): Payer: Medicare Other | Attending: Cardiology

## 2018-02-26 DIAGNOSIS — I208 Other forms of angina pectoris: Secondary | ICD-10-CM | POA: Diagnosis present

## 2018-03-14 MED FILL — METOPROLOL SUCCINATE ER 25: 25 | 30 days supply | Qty: 15 | Fill #3

## 2018-03-14 MED FILL — GABAPENTIN 100 MG CAPSULE: 100 | 30 days supply | Qty: 90 | Fill #1

## 2018-03-21 ENCOUNTER — Encounter: Payer: Self-pay | Admitting: Neurology

## 2018-03-21 ENCOUNTER — Ambulatory Visit (INDEPENDENT_AMBULATORY_CARE_PROVIDER_SITE_OTHER): Payer: Medicare Other | Admitting: Neurology

## 2018-03-21 VITALS — BP 138/75 | HR 66 | Ht 64.5 in | Wt 183.0 lb

## 2018-03-21 DIAGNOSIS — M62838 Other muscle spasm: Secondary | ICD-10-CM

## 2018-03-21 DIAGNOSIS — R252 Cramp and spasm: Secondary | ICD-10-CM | POA: Diagnosis not present

## 2018-03-21 DIAGNOSIS — M79669 Pain in unspecified lower leg: Secondary | ICD-10-CM | POA: Insufficient documentation

## 2018-03-21 DIAGNOSIS — I208 Other forms of angina pectoris: Secondary | ICD-10-CM | POA: Diagnosis not present

## 2018-03-21 NOTE — Progress Notes (Signed)
PATIENT: [name] DOB: April 11, 1950  Chief Complaint  Patient presents with  . Muscle Spasms/Pain    She is here with her husband, [name].  They would like to review her cervical and lumbar MRI results. She is taking gabapentin 155m, two capsules at bedtime.  She was unable to tolerate the daytime doses due to drowsiness and dizziness.      HISTORICAL  Christina WOOLENis a 68year old female is accompanied by her husband [name] seen in request by her primary care physician Dr. BMelford Aase MLegrand Comofor evaluation of pain, electric shocking sensation, muscle weakness and fatigue, initial evaluation was on January 15, 2018.  I have reviewed and summarized the referring note from the referring physician.  She had a past medical history of hypertension,  She presented intermittent muscle spasms since 2005, increased frequency since 2019, had one spell in January 2019, prolonged most severe spell in October 2019, all episodes are similar, mostly involving bilateral lower extremity muscles, sudden onset bilateral lower extremity muscle spasm from the waist down, painful, difficulty bearing weight, she has to sit down, spell last 5 minutes, no loss of consciousness, could not walking during the spells, felt weak, fatigued afterwards, in October 2019, she had a 6 prolonged episode each episode last about few minutes, sometimes woke her up from sleep, noticed some low back pain, radiating pain to right hip, she is now recovered, occasionally muscle spasm involving the neck pain, radiating pain to bilateral shoulder, bilateral upper extremity.  She denies significant gait abnormality, denies bowel and bladder incontinence.   Laboratory evaluations December 18, 2017, negative troponin, normal CBC, BMP with exception of mild elevated glucose 139, negative HIV,   Laboratory evaluations in Jan 2019, CPK 335,   UPDATE Mar 21 2018: Her muscle spasms are much improved, he is not sure during the benefit of  gabapentin, magnesium oxide 400 mg daily, multivitamin of her symptoms, in general, she would have flareup of her frequent muscle spasm for a few days, and quick improvement afterwards,  She describes her episode as sudden onset painful muscle spasm, sometimes involving upper extremity muscles, occasionally both lower extremity muscles, wake her up from sleep, difficulty moving, lasting for few minutes,  We have personally reviewed MRI of lumbar in December 2019, multilevel degenerative changes, left paramedian/lateral disc herniation, potential left L2 nerve root compression, multilevel variable degree of foraminal stenosis  MRI of cervical spine multilevel degenerative changes, no significant canal stenosis, variable degree of foraminal narrowing, most severe at C4-5, large left disc osteophyte causing moderately severe left foraminal narrowing  Laboratory evaluation seen October 2019, normal ESR 10, C-reactive protein less than 1, CMP, serum calcium was within normal limits 9.3, creatinine 0.86, normal CPK 182, aldolase 5.4, CBC, hemoglobin of 13.3,   REVIEW OF SYSTEMS: Full 14 system review of systems performed and notable only for as above All other review of systems were negative.  ALLERGIES: Allergies  Allergen Reactions  . Adhesive [Tape] Itching and Rash    Redness, Please use "paper" tape   . Aspirin Diarrhea and Nausea Only    Can tolerate low dose coated aspirin  . Lactose Intolerance (Gi) Diarrhea  . Potassium-Containing Compounds Other (See Comments)    Indigestion    HOME MEDICATIONS: Current Outpatient Medications  Medication Sig Dispense Refill  . amLODipine (NORVASC) 5 MG tablet TAKE 1 TABLET BY MOUTH DAILY. 90 tablet 3  . aspirin EC 81 MG tablet Take 81 mg by mouth at bedtime.     .Marland Kitchen  gabapentin (NEURONTIN) 100 MG capsule Take 1 capsule (100 mg total) by mouth 3 (three) times daily. 90 capsule 3  . isosorbide mononitrate (IMDUR) 30 MG 24 hr tablet Take 0.5 tablets  (15 mg total) by mouth daily. 45 tablet 3  . MAGNESIUM PO Take 200 mg by mouth 2 (two) times daily.     . metoprolol succinate (TOPROL-XL) 25 MG 24 hr tablet TAKE 1/2 TABLET BY MOUTH DAILY . PLEASE KEEP UPCOMING APPT IN OCTOBER WITH DR. Angelena Form FOR FUTURE REFILLS. THANK YOU 15 tablet 11  . nitroGLYCERIN (NITROSTAT) 0.4 MG SL tablet Place 1 tablet (0.4 mg total) under the tongue every 5 (five) minutes as needed for chest pain. 25 tablet 6   No current facility-administered medications for this visit.     PAST MEDICAL HISTORY: Past Medical History:  Diagnosis Date  . Basal cell carcinoma   . Hx of cardiac cath    a.  LHC (3/02): Normal coronary arteries. Left ventricular function was normal  //  b. LHC 06/29/15 - normal coronary arteries, EF 60-65%  . Hx of cardiovascular stress test    a. 7/10 - normal, EF 72% // b. Myoview 4/14 - low risk, apical thinning, no ischemia, EF 80% //  c. ETT-Myoview (11/15):  Normal stress nuclear study. No ischemia.  LV Ejection Fraction: 75%  . Hypercholesteremia   . Hypertension   . 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 possibly - normal cath 2017.  . Muscle cramp   . Pulmonary nodule    a. by CT 11/2017 with recommendation for f/u 6-12 mo  . Sinus problem   . Skin cancer    basal cell, squamous cell  . Syncope 2008   syncopal episode, probably related to excessive stress, activity, and fatigue        PAST SURGICAL HISTORY: Past Surgical History:  Procedure Laterality Date  . ABDOMINAL HYSTERECTOMY    . BASAL CELL CARCINOMA EXCISION    . CARDIAC CATHETERIZATION  2002   normal  . CARDIAC CATHETERIZATION    . CARDIAC CATHETERIZATION N/A 06/29/2015   Procedure: Left Heart Cath and Coronary Angiography;  Surgeon: Larey Dresser, MD;  Location: [facility];  Service: Cardiovascular;  Laterality: N/A;  . CESAREAN SECTION    . CHOLECYSTECTOMY    . SQUAMOUS CELL CARCINOMA EXCISION      FAMILY HISTORY: Family History  Problem Relation Age of  Onset  . Hypertension Father   . Heart attack Father 60  . Heart attack Mother        questionable  . Lung cancer Mother   . Coronary artery disease Unknown        strong family history   . Stroke Maternal Grandfather   . Diabetes Maternal Grandfather     SOCIAL HISTORY: Social History   Socioeconomic History  . Marital status: Married    Spouse name: Not on file  . Number of children: 3  . Years of education: one year college  . Highest education level: Not on file  Occupational History  . Occupation: Network engineer at [employer]  . Financial resource strain: Not on file  . Food insecurity:    Worry: Not on file    Inability: Not on file  . Transportation needs:    Medical: Not on file    Non-medical: Not on file  Tobacco Use  . Smoking status: Never Smoker  . Smokeless tobacco: Never Used  Substance and Sexual Activity  .  Alcohol use: No  . Drug use: No  . Sexual activity: Not on file  Lifestyle  . Physical activity:    Days per week: Not on file    Minutes per session: Not on file  . Stress: Not on file  Relationships  . Social connections:    Talks on phone: Not on file    Gets together: Not on file    Attends religious service: Not on file    Active member of club or organization: Not on file    Attends meetings of clubs or organizations: Not on file    Relationship status: Not on file  . Intimate partner violence:    Fear of current or ex partner: Not on file    Emotionally abused: Not on file    Physically abused: Not on file    Forced sexual activity: Not on file  Other Topics Concern  . Not on file  Social History Narrative   Lives at home with husband.   Right-handed.   1 cup caffeine per day.     PHYSICAL EXAM   Vitals:   03/21/18 1355  BP: 138/75  Pulse: 66  Weight: 183 lb (83 kg)  Height: 5' 4.5" (1.638 m)    Not recorded      Body mass index is 30.93 kg/m.  PHYSICAL EXAMNIATION:  Gen: NAD, conversant, well nourised,  obese, well groomed                     Cardiovascular: Regular rate rhythm, no peripheral edema, warm, nontender. Eyes: Conjunctivae clear without exudates or hemorrhage Neck: Supple, no carotid bruits. Pulmonary: Clear to auscultation bilaterally   NEUROLOGICAL EXAM:  MENTAL STATUS: Speech:    Speech is normal; fluent and spontaneous with normal comprehension.  Cognition:     Orientation to time, place and person     Normal recent and remote memory     Normal Attention span and concentration     Normal Language, naming, repeating,spontaneous speech     Fund of knowledge   CRANIAL NERVES: CN II: Visual fields are full to confrontation.   Pupils are round equal and briskly reactive to light. CN III, IV, VI: extraocular movement are normal. No ptosis. CN V: Facial sensation is intact to pinprick in all 3 divisions bilaterally. Corneal responses are intact.  CN VII: Face is symmetric with normal eye closure and smile. CN VIII: Hearing is normal to rubbing fingers CN IX, X: Palate elevates symmetrically. Phonation is normal. CN XI: Head turning and shoulder shrug are intact CN XII: Tongue is midline with normal movements and no atrophy.  MOTOR: There is no pronator drift of out-stretched arms. Muscle bulk and tone are normal. Muscle strength is normal.  REFLEXES: Reflexes are 2+ and symmetric at the biceps, triceps, knees, and ankles. Plantar responses are flexor.  SENSORY: Intact to light touch, pinprick, positional sensation and vibratory sensation are intact in fingers and toes.  COORDINATION: Rapid alternating movements and fine finger movements are intact. There is no dysmetria on finger-to-nose and heel-knee-shin.    GAIT/STANCE: Posture is normal. Gait is steady with normal steps, base, arm swing, and turning. Heel and toe walking are normal. Tandem gait is normal.  Romberg is absent.   DIAGNOSTIC DATA (LABS, IMAGING, TESTING) - I reviewed patient records, labs,  notes, testing and imaging myself where available.   ASSESSMENT AND PLAN  ZO LOUDON is a 68 y.o. female   Frequent muscle spasm, chronic  low back pain  Essentially normal neurological examination  MRI of cervical spine, lumbar spine showed multilevel degenerative changes, but no significant canal stenosis, I do not think findings would explain her recurrent episode of painful muscle spasm,  Laboratory evaluation showed no treatable etiology,  Gabapentin or magnesium as needed if she has recurrent spells,  Marcial Pacas, M.D. Ph.D.  [facility] [address] [place], [place] [address] Ph: [phone] Fax: [phone]  CC: Chesley Noon, MD

## 2018-04-01 MED FILL — AMLODIPINE BESYLATE 5 MG TA: 5 | 90 days supply | Qty: 90 | Fill #1

## 2018-04-15 ENCOUNTER — Other Ambulatory Visit: Payer: Self-pay | Admitting: Family Medicine

## 2018-04-15 DIAGNOSIS — Z1231 Encounter for screening mammogram for malignant neoplasm of breast: Secondary | ICD-10-CM

## 2018-04-15 MED FILL — METOPROLOL SUCCINATE ER 25: 25 | 30 days supply | Qty: 15 | Fill #4

## 2018-05-09 ENCOUNTER — Inpatient Hospital Stay: Admission: RE | Admit: 2018-05-09 | Payer: Medicare Other | Source: Ambulatory Visit

## 2018-05-14 MED FILL — METOPROLOL SUCCINATE ER 25: 25 | 30 days supply | Qty: 15 | Fill #5 | Status: TO

## 2018-05-15 MED FILL — ISOSORBIDE MN ER 30 MG TAB: 30 | 90 days supply | Qty: 45 | Fill #0

## 2018-06-11 MED FILL — METOPROLOL SUCCINATE ER 25: 25 | 30 days supply | Qty: 15 | Fill #0

## 2018-06-26 MED FILL — AMLODIPINE BESYLATE 5 MG TA: 5 | 90 days supply | Qty: 90 | Fill #0

## 2018-07-12 MED FILL — METOPROLOL SUCCINATE ER 25: 25 | 30 days supply | Qty: 15 | Fill #1

## 2018-07-24 ENCOUNTER — Telehealth: Payer: Self-pay | Admitting: *Deleted

## 2018-07-24 NOTE — Telephone Encounter (Addendum)
Video visit with Dr. Angelena Form on June 15,2020 Consent obtained on June 3,2020 Pt has scales and BP cuff at home and will record these readings.   Virtual Visit Pre-Appointment Phone Call  "(Name), I am calling you today to discuss your upcoming appointment. We are currently trying to limit exposure to the virus that causes COVID-19 by seeing patients at home rather than in the office."  1. "What is the BEST phone number to call the day of the visit?" - include this in appointment [phone]  2. "Do you have or have access to (through a family member/friend) a smartphone with video capability that we can use for your visit?"yes a. If yes - list this number in appt notes as "cell" (if different from BEST phone #) and list the appointment type as a VIDEO visit in appointment notes b. If no - list the appointment type as a PHONE visit in appointment notes  3. Confirm consent - "In the setting of the current Covid19 crisis, you are scheduled for a video visit with your provider on June 15,2020.  Just as we do with many in-office visits, in order for you to participate in this visit, we must obtain consent.  If you'd like, I can send this to your mychart (if signed up) or email for you to review.  Otherwise, I can obtain your verbal consent now.  All virtual visits are billed to your insurance company just like a normal visit would be.  By agreeing to a virtual visit, we'd like you to understand that the technology does not allow for your provider to perform an examination, and thus may limit your provider's ability to fully assess your condition. If your provider identifies any concerns that need to be evaluated in person, we will make arrangements to do so.  Finally, though the technology is pretty good, we cannot assure that it will always work on either your or our end, and in the setting of a video visit, we may have to convert it to a phone-only visit.  In either situation, we cannot ensure  that we have a secure connection.  Are you willing to proceed?" STAFF: Did the patient verbally acknowledge consent to telehealth visit? Document YES/NO here: yes  4. Advise patient to be prepared - "Two hours prior to your appointment, go ahead and check your blood pressure, pulse, oxygen saturation, and your weight (if you have the equipment to check those) and write them all down. When your visit starts, your provider will ask you for this information. If you have an Apple Watch or Kardia device, please plan to have heart rate information ready on the day of your appointment. Please have a pen and paper handy nearby the day of the visit as well."  5. Give patient instructions for MyChart download to smartphone OR Doximity/Doxy.me as below if video visit (depending on what platform provider is using)  6. Inform patient they will receive a phone call 15 minutes prior to their appointment time (may be from unknown caller ID) so they should be prepared to answer    TELEPHONE CALL NOTE  [name] has been deemed a candidate for a follow-up tele-health visit to limit community exposure during the Covid-19 pandemic. I spoke with the patient via phone to ensure availability of phone/video source, confirm preferred email & phone number, and discuss instructions and expectations.  I reminded [name] to be prepared with any vital sign and/or heart rhythm information that could potentially  be obtained via home monitoring, at the time of her visit. I reminded [name] to expect a phone call prior to her visit.  Leodis Liverpool, RN 07/24/2018 8:46 AM   INSTRUCTIONS FOR DOWNLOADING THE MYCHART APP TO SMARTPHONE  - The patient must first make sure to have activated MyChart and know their login information - If Apple, go to [employer] and type in MyChart in the search bar and download the app. If Android, ask patient to go to [employer] and type in [place] in the search bar and  download the app. The app is free but as with any other app downloads, their phone may require them to verify saved payment information or Apple/Android password.  - The patient will need to then log into the app with their MyChart username and password, and select [facility] as their healthcare provider to link the account. When it is time for your visit, go to the MyChart app, find appointments, and click Begin Video Visit. Be sure to Select Allow for your device to access the Microphone and Camera for your visit. You will then be connected, and your provider will be with you shortly.  **If they have any issues connecting, or need assistance please contact MyChart service desk [phone])**  **If using a computer, in order to ensure the best quality for their visit they will need to use either of the following Internet Browsers: [employer], or Google Chrome**  IF USING DOXIMITY or DOXY.ME - The patient will receive a link just prior to their visit by text.     FULL LENGTH CONSENT FOR TELE-HEALTH VISIT   I hereby voluntarily request, consent and authorize [facility] and its employed or contracted physicians, physician assistants, nurse practitioners or other licensed health care professionals (the Practitioner), to provide me with telemedicine health care services (the "Services") as deemed necessary by the treating Practitioner. I acknowledge and consent to receive the Services by the Practitioner via telemedicine. I understand that the telemedicine visit will involve communicating with the Practitioner through live audiovisual communication technology and the disclosure of certain medical information by electronic transmission. I acknowledge that I have been given the opportunity to request an in-person assessment or other available alternative prior to the telemedicine visit and am voluntarily participating in the telemedicine visit.  I understand that I have the right to  withhold or withdraw my consent to the use of telemedicine in the course of my care at any time, without affecting my right to future care or treatment, and that the Practitioner or I may terminate the telemedicine visit at any time. I understand that I have the right to inspect all information obtained and/or recorded in the course of the telemedicine visit and may receive copies of available information for a reasonable fee.  I understand that some of the potential risks of receiving the Services via telemedicine include:  Marland Kitchen Delay or interruption in medical evaluation due to technological equipment failure or disruption; . Information transmitted may not be sufficient (e.g. poor resolution of images) to allow for appropriate medical decision making by the Practitioner; and/or  . In rare instances, security protocols could fail, causing a breach of personal health information.  Furthermore, I acknowledge that it is my responsibility to provide information about my medical history, conditions and care that is complete and accurate to the best of my ability. I acknowledge that Practitioner's advice, recommendations, and/or decision may be based on factors not within their control,  such as incomplete or inaccurate data provided by me or distortions of diagnostic images or specimens that may result from electronic transmissions. I understand that the practice of medicine is not an exact science and that Practitioner makes no warranties or guarantees regarding treatment outcomes. I acknowledge that I will receive a copy of this consent concurrently upon execution via email to the email address I last provided but may also request a printed copy by calling the office of [facility].    I understand that my insurance will be billed for this visit.   I have read or had this consent read to me. . I understand the contents of this consent, which adequately explains the benefits and risks of the Services being  provided via telemedicine.  . I have been provided ample opportunity to ask questions regarding this consent and the Services and have had my questions answered to my satisfaction. . I give my informed consent for the services to be provided through the use of telemedicine in my medical care  By participating in this telemedicine visit I agree to the above.

## 2018-08-05 ENCOUNTER — Other Ambulatory Visit: Payer: Self-pay

## 2018-08-05 ENCOUNTER — Encounter: Payer: Self-pay | Admitting: Cardiovascular Disease

## 2018-08-05 ENCOUNTER — Telehealth (INDEPENDENT_AMBULATORY_CARE_PROVIDER_SITE_OTHER): Payer: Medicare Other | Admitting: Cardiovascular Disease

## 2018-08-05 VITALS — BP 127/71 | HR 55 | Ht 64.5 in | Wt 157.0 lb

## 2018-08-05 DIAGNOSIS — I208 Other forms of angina pectoris: Secondary | ICD-10-CM | POA: Diagnosis not present

## 2018-08-05 NOTE — Progress Notes (Signed)
Virtual Visit via Video Note   This visit type was conducted due to national recommendations for restrictions regarding the COVID-19 Pandemic (e.g. social distancing) in an effort to limit this patient's exposure and mitigate transmission in our community.  Due to her co-morbid illnesses, this patient is at least at moderate risk for complications without adequate follow up.  This format is felt to be most appropriate for this patient at this time.  All issues noted in this document were discussed and addressed.  A limited physical exam was performed with this format.  Please refer to the patient's chart for her consent to telehealth for [facility].   Date:  08/05/2018   ID:  Kelle Darting, DOB 26-Apr-1950, MRN [id number]  Patient Location: Home Provider Location: Office  PCP:  Chesley Noon, MD  Cardiologist:  Lauree Chandler, MD  Electrophysiologist:  None   Evaluation Performed:  Follow-Up Visit  Chief Complaint:  Follow up- Microvascular angina  History of Present Illness:    JOIE HIPPS is a 68 y.o. female with history of microvascular angina, HTN and HLD who is being seen today by virtual e-visit due to the Covid19 pandemic. She is suspected to have microvascular angina with chest pain occurring when walking up hills. Cardiac cath May 2017 with no evidence of CAD. Normal LV systolic function by cath. She was started on Imdur which helped her chest pain.   The patient denies chest pain, dyspnea, palpitations, dizziness, near syncope or syncope. No lower extremity edema. She feels great. She has lost 30 lbs by exercising and by changing her diet. Walking 1 hour per day.   The patient does not have symptoms concerning for COVID-19 infection (fever, chills, cough, or new shortness of breath).    Past Medical History:  Diagnosis Date  . Basal cell carcinoma   . Hx of cardiac cath    a.  LHC (3/02): Normal coronary arteries. Left ventricular function was  normal  //  b. LHC 06/29/15 - normal coronary arteries, EF 60-65%  . Hx of cardiovascular stress test    a. 7/10 - normal, EF 72% // b. Myoview 4/14 - low risk, apical thinning, no ischemia, EF 80% //  c. ETT-Myoview (11/15):  Normal stress nuclear study. No ischemia.  LV Ejection Fraction: 75%  . Hypercholesteremia   . Hypertension   . Microvascular angina ([place])    a. possibly - normal cath 2017.  . Muscle cramp   . Pulmonary nodule    a. by CT 11/2017 with recommendation for f/u 6-12 mo  . Sinus problem   . Skin cancer    basal cell, squamous cell  . Syncope 2008   syncopal episode, probably related to excessive stress, activity, and fatigue       Past Surgical History:  Procedure Laterality Date  . ABDOMINAL HYSTERECTOMY    . BASAL CELL CARCINOMA EXCISION    . CARDIAC CATHETERIZATION  2002   normal  . CARDIAC CATHETERIZATION    . CARDIAC CATHETERIZATION N/A 06/29/2015   Procedure: Left Heart Cath and Coronary Angiography;  Surgeon: Larey Dresser, MD;  Location: [place] [facility];  Service: Cardiovascular;  Laterality: N/A;  . CESAREAN SECTION    . CHOLECYSTECTOMY    . SQUAMOUS CELL CARCINOMA EXCISION       Current Meds  Medication Sig  . amLODipine (NORVASC) 5 MG tablet TAKE 1 TABLET BY MOUTH DAILY.  Marland Kitchen aspirin EC 81 MG tablet Take 81  mg by mouth at bedtime.   . isosorbide mononitrate (IMDUR) 30 MG 24 hr tablet Take 0.5 tablets (15 mg total) by mouth daily.  Marland Kitchen MAGNESIUM PO Take 200 mg by mouth daily.   . metoprolol succinate (TOPROL-XL) 25 MG 24 hr tablet TAKE 1/2 TABLET BY MOUTH DAILY . PLEASE KEEP UPCOMING APPT IN OCTOBER WITH DR. Angelena Form FOR FUTURE REFILLS. THANK YOU  . Multiple Vitamins-Minerals (CENTRUM SILVER PO) Take 1 tablet by mouth daily.  . nitroGLYCERIN (NITROSTAT) 0.4 MG SL tablet Place 1 tablet (0.4 mg total) under the tongue every 5 (five) minutes as needed for chest pain.  Marland Kitchen Potassium 99 MG TABS Take 99 mg by mouth daily.     Allergies:   Adhesive [tape],  Aspirin, Lactose intolerance (gi), and Potassium-containing compounds   Social History   Tobacco Use  . Smoking status: Never Smoker  . Smokeless tobacco: Never Used  Substance Use Topics  . Alcohol use: No  . Drug use: No     Family Hx: The patient's family history includes Coronary artery disease in her unknown relative; Diabetes in her maternal grandfather; Heart attack in her mother; Heart attack (age of onset: 77) in her father; Hypertension in her father; Lung cancer in her mother; Stroke in her maternal grandfather.  ROS:   Please see the history of present illness.    All other systems reviewed and are negative.   Prior CV studies:   The following studies were reviewed today:    Labs/Other Tests and Data Reviewed:    EKG:  No ECG reviewed.  Recent Labs: 12/17/2017: BUN 9; Creatinine, Ser 0.82; Hemoglobin 12.9; Platelets 208; Potassium 3.5; Sodium 141   Recent Lipid Panel Lab Results  Component Value Date/Time   CHOL 143 01/24/2017 08:56 AM   TRIG 107 01/24/2017 08:56 AM   HDL 57 01/24/2017 08:56 AM   CHOLHDL 2.5 01/24/2017 08:56 AM   CHOLHDL 3 10/07/2014 10:55 AM   LDLCALC 65 01/24/2017 08:56 AM   LDLDIRECT 102.3 10/17/2011 09:41 AM    Wt Readings from Last 3 Encounters:  08/05/18 157 lb (71.2 kg)  03/21/18 183 lb (83 kg)  02/06/18 186 lb 12.8 oz (84.7 kg)     Objective:    Vital Signs:  BP 127/71   Pulse (!) 55   Ht 5' 4.5" (1.638 m)   Wt 157 lb (71.2 kg)   BMI 26.53 kg/m    VITAL SIGNS:  reviewed GEN:  no acute distress  ASSESSMENT & PLAN:     1. Microvascular angina: No evidence of CAD by cath in 2017. No chest pain Norvasc, Imdur and Toprol. Continue current meds.    2. Hyperlipidemia: She has not tolerated statins due to muscle aches.   COVID-19 Education: The signs and symptoms of COVID-19 were discussed with the patient and how to seek care for testing (follow up with PCP or arrange E-visit).  The importance of social distancing  was discussed today.  Time:   Today, I have spent 15 minutes with the patient with telehealth technology discussing the above problems.     Medication Adjustments/Labs and Tests Ordered: Current medicines are reviewed at length with the patient today.  Concerns regarding medicines are outlined above.   Tests Ordered: No orders of the defined types were placed in this encounter.   Medication Changes: No orders of the defined types were placed in this encounter.   Disposition:  Follow up in 1 year(s)  Signed, Lauree Chandler, MD  08/05/2018 2:58  PM    [facility]

## 2018-08-05 NOTE — Patient Instructions (Signed)

## 2018-08-07 ENCOUNTER — Other Ambulatory Visit: Payer: Self-pay

## 2018-08-07 ENCOUNTER — Ambulatory Visit
Admission: RE | Admit: 2018-08-07 | Discharge: 2018-08-07 | Disposition: A | Discharge status: Home / Self Care | Payer: Medicare Other | Source: Ambulatory Visit | Attending: Family Medicine | Admitting: Family Medicine

## 2018-08-07 DIAGNOSIS — Z1231 Encounter for screening mammogram for malignant neoplasm of breast: Secondary | ICD-10-CM

## 2018-08-12 MED FILL — METOPROLOL SUCCINATE ER 25: 25 | 30 days supply | Qty: 15 | Fill #2

## 2018-08-15 MED FILL — ISOSORBIDE MN ER 30 MG TAB: 30 | 90 days supply | Qty: 45 | Fill #1

## 2018-09-09 MED FILL — METOPROLOL SUCCINATE ER 25: 25 | 30 days supply | Qty: 15 | Fill #0

## 2018-09-27 MED FILL — AMLODIPINE BESYLATE 5 MG TA: 5 | 90 days supply | Qty: 90 | Fill #0

## 2018-10-11 MED FILL — METOPROLOL SUCCINATE ER 25: 25 | 30 days supply | Qty: 15 | Fill #1

## 2018-11-11 MED FILL — ISOSORBIDE MN ER 30 MG TAB: 30 | 90 days supply | Qty: 45 | Fill #0

## 2018-11-11 MED FILL — METOPROLOL SUCCINATE ER 25: 25 | 30 days supply | Qty: 15 | Fill #2

## 2018-12-11 ENCOUNTER — Other Ambulatory Visit: Payer: Self-pay | Admitting: Cardiovascular Disease

## 2018-12-11 MED FILL — METOPROLOL SUCCINATE ER 25: 25 | 30 days supply | Qty: 15 | Fill #0

## 2018-12-26 ENCOUNTER — Other Ambulatory Visit: Payer: Self-pay | Admitting: Cardiovascular Disease

## 2018-12-27 MED FILL — AMLODIPINE BESYLATE 5 MG TA: 5 | 90 days supply | Qty: 90 | Fill #0

## 2019-01-09 MED FILL — METOPROLOL SUCCINATE ER 25: 25 | 30 days supply | Qty: 15 | Fill #1

## 2019-01-20 ENCOUNTER — Telehealth: Payer: Self-pay | Admitting: Cardiovascular Disease

## 2019-01-20 NOTE — Telephone Encounter (Signed)
STAT if HR is under 50 or over 120 (normal HR is 60-100 beats per minute)  1) What is your heart rate? 66  2) Do you have a log of your heart rate readings (document readings)? Pt only gets her pulse if she has to take her BP, but she does not keep a record  3) Do you have any other symptoms? No.  Patient walked on the treadmill yesterday 45 min to an hour after walking on the treadmill, she noticed that her HR was in the 30s. It stayed low for about 30 minutes before it started to go back up again. She noticed it because her sister gave her a fitbit with a HR monitor on it. Her HR seems more normal now, but she just wanted to make DR. McAlhany aware of waht happened, and what to do if this happens again

## 2019-01-20 NOTE — Telephone Encounter (Signed)
Last evening was wearing a fitbit and noticed it was 30.    Lasted 30-45 min.  She was just sitting on the couch. Only knew because the fit bit buzzed. No lightheadedness or dizziness.  Was tired.  Had worked out on treadmill prior. Went back up to 37 and all day today 58-62.   Takes Toprol 12.5 mg every evening but can't remember if she took prior to low HR last night.  Lost 45 pounds over the past year.  Watching diet and exercising every day. Feeling good in general.  Adv to hold Toprol this evening and monitor HR and BP and I will route to Dr. Angelena Form for further recommendations and call her back. Adv if feels like may pass out to sit quickly and lay back with legs elevated. Adv can call on call provider if further concerns overnight.

## 2019-01-21 NOTE — Telephone Encounter (Signed)
I would have her hold the Toprol and monitor her heart rate for now. If she has any dizziness or sustained low heart rates, we can arrange a cardiac monitor. Gerald Stabs

## 2019-01-21 NOTE — Telephone Encounter (Signed)
Called patient and informed of recommendations.  She will call back if continues to have sustained low heart rate or dizziness.  Metoprolol succinate dc'd.

## 2019-02-11 ENCOUNTER — Other Ambulatory Visit: Payer: Self-pay | Admitting: Physician Assistant

## 2019-02-11 MED FILL — ISOSORBIDE MN ER 30 MG TAB: 30 | 90 days supply | Qty: 45 | Fill #0

## 2019-03-28 MED FILL — AMLODIPINE BESYLATE 5 MG TA: 5 | 90 days supply | Qty: 90 | Fill #1

## 2019-04-12 ENCOUNTER — Ambulatory Visit: Payer: Medicare Other | Attending: Internal Medicine

## 2019-04-12 DIAGNOSIS — Z23 Encounter for immunization: Secondary | ICD-10-CM | POA: Insufficient documentation

## 2019-04-12 NOTE — Progress Notes (Signed)
   Covid-19 Vaccination Clinic  Name:  [name]    MRN: [id number] DOB: 1950-07-09  04/12/2019  [name] was observed post Covid-19 immunization for 15 minutes without incidence. She was provided with Vaccine Information Sheet and instruction to access the V-Safe system.   [name] was instructed to call 911 with any severe reactions post vaccine: Marland Kitchen Difficulty breathing  . Swelling of your face and throat  . A fast heartbeat  . A bad rash all over your body  . Dizziness and weakness    Immunizations Administered    Name Date Dose VIS Date Route   Pfizer COVID-19 Vaccine 04/12/2019 12:16 PM 0.3 mL 01/31/2019 Intramuscular   Manufacturer: [place]   Lot: [id number]   [place]: [id number]

## 2019-05-05 ENCOUNTER — Ambulatory Visit: Payer: Medicare Other | Attending: Internal Medicine

## 2019-05-05 DIAGNOSIS — Z23 Encounter for immunization: Secondary | ICD-10-CM

## 2019-05-05 NOTE — Progress Notes (Signed)
   Covid-19 Vaccination Clinic  Name:  RUMAN TANI    MRN: [id number] DOB: 1950/04/08  05/05/2019  Ms. Hollern was observed post Covid-19 immunization for 15 minutes without incident. She was provided with Vaccine Information Sheet and instruction to access the V-Safe system.   Ms. Risdon was instructed to call 911 with any severe reactions post vaccine: Marland Kitchen Difficulty breathing  . Swelling of face and throat  . A fast heartbeat  . A bad rash all over body  . Dizziness and weakness   Immunizations Administered    Name Date Dose VIS Date Route   Pfizer COVID-19 Vaccine 05/05/2019  9:35 AM 0.3 mL 01/31/2019 Intramuscular   Manufacturer: [place]   Lot: [id number]   [place]: [id number]

## 2019-05-19 MED FILL — ISOSORBIDE MN ER 30 MG TAB: 30 | 90 days supply | Qty: 45 | Fill #1

## 2019-06-26 ENCOUNTER — Other Ambulatory Visit: Payer: Self-pay | Admitting: Cardiovascular Disease

## 2019-06-26 MED FILL — AMLODIPINE BESYLATE 5 MG TA: 5 | 90 days supply | Qty: 90 | Fill #0

## 2019-07-14 ENCOUNTER — Other Ambulatory Visit: Payer: Self-pay | Admitting: Family Medicine

## 2019-07-14 DIAGNOSIS — Z1231 Encounter for screening mammogram for malignant neoplasm of breast: Secondary | ICD-10-CM

## 2019-08-08 ENCOUNTER — Other Ambulatory Visit: Payer: Self-pay

## 2019-08-08 ENCOUNTER — Ambulatory Visit
Admission: RE | Admit: 2019-08-08 | Discharge: 2019-08-08 | Disposition: A | Discharge status: Home / Self Care | Payer: Medicare Other | Source: Ambulatory Visit | Attending: Family Medicine | Admitting: Family Medicine

## 2019-08-08 DIAGNOSIS — Z1231 Encounter for screening mammogram for malignant neoplasm of breast: Secondary | ICD-10-CM

## 2019-08-20 ENCOUNTER — Ambulatory Visit (INDEPENDENT_AMBULATORY_CARE_PROVIDER_SITE_OTHER): Payer: Medicare Other | Admitting: Cardiovascular Disease

## 2019-08-20 ENCOUNTER — Other Ambulatory Visit: Payer: Self-pay | Admitting: Cardiovascular Disease

## 2019-08-20 ENCOUNTER — Encounter: Payer: Self-pay | Admitting: Cardiovascular Disease

## 2019-08-20 ENCOUNTER — Other Ambulatory Visit: Payer: Self-pay

## 2019-08-20 VITALS — BP 144/80 | HR 77 | Ht 64.5 in | Wt 144.6 lb

## 2019-08-20 DIAGNOSIS — I208 Other forms of angina pectoris: Secondary | ICD-10-CM | POA: Diagnosis not present

## 2019-08-20 MED ORDER — ISOSORBIDE MONONITRATE ER 30 MG PO TB24: 15.0000 mg | ORAL_TABLET | Freq: Every day | ORAL | 3 refills | Status: DC

## 2019-08-20 MED ORDER — NITROGLYCERIN 0.4 MG SL SUBL: 0.4000 mg | SUBLINGUAL_TABLET | SUBLINGUAL | 6 refills | Status: DC | PRN

## 2019-08-20 MED ORDER — AMLODIPINE BESYLATE 5 MG PO TABS: 5.0000 mg | ORAL_TABLET | Freq: Every day | ORAL | 3 refills | Status: DC

## 2019-08-20 MED FILL — ISOSORBIDE MN ER 30 MG TAB: 30 | 90 days supply | Qty: 45 | Fill #0

## 2019-08-20 MED FILL — NITROGLYCERIN 0.4 MG TAB SL: 0.4 | 7 days supply | Qty: 25 | Fill #0

## 2019-08-20 NOTE — Patient Instructions (Signed)
Medication Instructions:  No changes today *If you need a refill on your cardiac medications before your next appointment, please call your pharmacy*   Lab Work: none If you have labs (blood work) drawn today and your tests are completely normal, you will receive your results only by: . MyChart Message (if you have MyChart) OR . A paper copy in the mail If you have any lab test that is abnormal or we need to change your treatment, we will call you to review the results.   Testing/Procedures: none   Follow-Up: At [facility], you and your health needs are our priority.  As part of our continuing mission to provide you with exceptional heart care, we have created designated Provider Care Teams.  These Care Teams include your primary Cardiologist (physician) and Advanced Practice Providers (APPs -  Physician Assistants and Nurse Practitioners) who all work together to provide you with the care you need, when you need it.  Your next appointment:   12 month(s)  The format for your next appointment:   In Person  Provider:   You may see Christopher McAlhany, MD or one of the following Advanced Practice Providers on your designated Care Team:    Dayna Dunn, PA-C  Michele Lenze, PA-C    Other Instructions  

## 2019-08-20 NOTE — Progress Notes (Signed)
Chief Complaint  Patient presents with  . Follow-up    HTN   History of Present Illness: 69 yo female with history of microvascular angina, HTN and HLD who is here today for cardiac follow up. She has been followed in the past in our office by Dr. Aundra Dubin. She is suspected to have microvascular angina with chest pain occurring when walking up hills. Cardiac cath May 2017 with no evidence of CAD. Normal LV systolic function by cath. She was started on Imdur which helped her chest pain. Toprol stopped in November 2020 due to bradycardia.   She is here today for follow up. The patient denies any chest pain, dyspnea, palpitations, lower extremity edema, orthopnea, PND, dizziness, near syncope or syncope. She feels great.   Primary Care Physician: Chesley Noon, MD  Past Medical History:  Diagnosis Date  . Basal cell carcinoma   . Hx of cardiac cath    a.  LHC (3/02): Normal coronary arteries. Left ventricular function was normal  //  b. LHC 06/29/15 - normal coronary arteries, EF 60-65%  . Hx of cardiovascular stress test    a. 7/10 - normal, EF 72% // b. Myoview 4/14 - low risk, apical thinning, no ischemia, EF 80% //  c. ETT-Myoview (11/15):  Normal stress nuclear study. No ischemia.  LV Ejection Fraction: 75%  . Hypercholesteremia   . Hypertension   . Microvascular angina ([place])    a. possibly - normal cath 2017.  . Muscle cramp   . Pulmonary nodule    a. by CT 11/2017 with recommendation for f/u 6-12 mo  . Sinus problem   . Skin cancer    basal cell, squamous cell  . Syncope 2008   syncopal episode, probably related to excessive stress, activity, and fatigue        Past Surgical History:  Procedure Laterality Date  . ABDOMINAL HYSTERECTOMY    . BASAL CELL CARCINOMA EXCISION    . CARDIAC CATHETERIZATION  2002   normal  . CARDIAC CATHETERIZATION    . CARDIAC CATHETERIZATION N/A 06/29/2015   Procedure: Left Heart Cath and Coronary Angiography;  Surgeon: Larey Dresser, MD;   Location: [place] [facility];  Service: Cardiovascular;  Laterality: N/A;  . CESAREAN SECTION    . CHOLECYSTECTOMY    . SQUAMOUS CELL CARCINOMA EXCISION      Current Outpatient Medications  Medication Sig Dispense Refill  . amLODipine (NORVASC) 5 MG tablet Take 1 tablet (5 mg total) by mouth daily. 90 tablet 3  . aspirin EC 81 MG tablet Take 81 mg by mouth at bedtime.     . isosorbide mononitrate (IMDUR) 30 MG 24 hr tablet Take 0.5 tablets (15 mg total) by mouth daily. 45 tablet 3  . MAGNESIUM PO Take 200 mg by mouth daily.     . Multiple Vitamins-Minerals (CENTRUM SILVER PO) Take 1 tablet by mouth daily.    . nitroGLYCERIN (NITROSTAT) 0.4 MG SL tablet Place 1 tablet (0.4 mg total) under the tongue every 5 (five) minutes as needed for chest pain. 25 tablet 6  . Potassium 99 MG TABS Take 99 mg by mouth daily.     No current facility-administered medications for this visit.    Allergies  Allergen Reactions  . Adhesive [Tape] Itching and Rash    Redness, Please use "paper" tape   . Aspirin Diarrhea and Nausea Only    Can tolerate low dose coated aspirin  . Lactose Intolerance (Gi) Diarrhea  . Potassium-Containing  Compounds Other (See Comments)    Indigestion    Social History   Socioeconomic History  . Marital status: Married    Spouse name: Not on file  . Number of children: 3  . Years of education: one year college  . Highest education level: Not on file  Occupational History  . Occupation: Network engineer at [employer]  . Smoking status: Never Smoker  . Smokeless tobacco: Never Used  Vaping Use  . Vaping Use: Never used  Substance and Sexual Activity  . Alcohol use: No  . Drug use: No  . Sexual activity: Not on file  Other Topics Concern  . Not on file  Social History Narrative   Lives at home with husband.   Right-handed.   1 cup caffeine per day.   Social Determinants of Health   Financial Resource Strain:   . Difficulty of Paying Living Expenses:     Food Insecurity:   . Worried About Charity fundraiser in the Last Year:   . Arboriculturist in the Last Year:   Transportation Needs:   . Film/video editor (Medical):   Marland Kitchen Lack of Transportation (Non-Medical):   Physical Activity:   . Days of Exercise per Week:   . Minutes of Exercise per Session:   Stress:   . Feeling of Stress :   Social Connections:   . Frequency of Communication with Friends and Family:   . Frequency of Social Gatherings with Friends and Family:   . Attends Religious Services:   . Active Member of Clubs or Organizations:   . Attends Archivist Meetings:   Marland Kitchen Marital Status:   Intimate Partner Violence:   . Fear of Current or Ex-Partner:   . Emotionally Abused:   Marland Kitchen Physically Abused:   . Sexually Abused:     Family History  Problem Relation Age of Onset  . Hypertension Father   . Heart attack Father 18  . Heart attack Mother        questionable  . Lung cancer Mother   . Coronary artery disease Other        strong family history   . Stroke Maternal Grandfather   . Diabetes Maternal Grandfather     Review of Systems:  As stated in the HPI and otherwise negative.   BP (!) 144/80   Pulse 77   Ht 5' 4.5" (1.638 m)   Wt 144 lb 9.6 oz (65.6 kg)   SpO2 97%   BMI 24.44 kg/m   Physical Examination: General: Well developed, well nourished, NAD  HEENT: OP clear, mucus membranes moist  SKIN: warm, dry. No rashes. Neuro: No focal deficits  Musculoskeletal: Muscle strength 5/5 all ext  Psychiatric: Mood and affect normal  Neck: No JVD, no carotid bruits, no thyromegaly, no lymphadenopathy.  Lungs:Clear bilaterally, no wheezes, rhonci, crackles Cardiovascular: Regular rate and rhythm. No murmurs, gallops or rubs. Abdomen:Soft. Bowel sounds present. Non-tender.  Extremities: No lower extremity edema. Pulses are 2 + in the bilateral DP/PT.  EKG:  EKG is ordered today. The ekg ordered today demonstrates sinus  Recent Labs: No results  found for requested labs within last 8760 hours.   Lipid Panel    Component Value Date/Time   CHOL 143 01/24/2017 0856   TRIG 107 01/24/2017 0856   HDL 57 01/24/2017 0856   CHOLHDL 2.5 01/24/2017 0856   CHOLHDL 3 10/07/2014 1055   VLDL 25.8 10/07/2014 1055   LDLCALC 65 01/24/2017  0856   LDLDIRECT 102.3 10/17/2011 0941     Wt Readings from Last 3 Encounters:  08/20/19 144 lb 9.6 oz (65.6 kg)  08/05/18 157 lb (71.2 kg)  03/21/18 183 lb (83 kg)     Other studies Reviewed: Additional studies/ records that were reviewed today include: Review of the above records demonstrates:  Assessment and Plan:   1. Microvascular angina: She has no chest pain. Continue Imdur and Norvasc. Toprol stopped in November 2020 due to bradycardia.   2. Hyperlipidemia: She has not tolerated statins due to muscle aches.  Current medicines are reviewed at length with the patient today.  The patient does not have concerns regarding medicines.  The following changes have been made:  no change  Labs/ tests ordered today include:   Orders Placed This Encounter  Procedures  . EKG 12-Lead   Disposition:   FU with me in 12 months  Signed, Lauree Chandler, MD 08/20/2019 12:43 PM    [facility] [place], [place], [place]  [address] Phone: [phone]; Fax: [phone]

## 2019-09-23 ENCOUNTER — Emergency Department (HOSPITAL_COMMUNITY): Payer: Medicare Other

## 2019-09-23 ENCOUNTER — Other Ambulatory Visit: Payer: Self-pay

## 2019-09-23 ENCOUNTER — Emergency Department (HOSPITAL_COMMUNITY)
Admission: EM | Admit: 2019-09-23 | Discharge: 2019-09-23 | Disposition: A | Discharge status: Home / Self Care | Payer: Medicare Other | Attending: Emergency Medicine | Admitting: Emergency Medicine

## 2019-09-23 ENCOUNTER — Encounter (HOSPITAL_COMMUNITY): Payer: Self-pay | Admitting: Emergency Medicine

## 2019-09-23 DIAGNOSIS — I10 Essential (primary) hypertension: Secondary | ICD-10-CM

## 2019-09-23 DIAGNOSIS — Z9861 Coronary angioplasty status: Secondary | ICD-10-CM | POA: Insufficient documentation

## 2019-09-23 DIAGNOSIS — I209 Angina pectoris, unspecified: Secondary | ICD-10-CM | POA: Diagnosis not present

## 2019-09-23 DIAGNOSIS — R519 Headache, unspecified: Secondary | ICD-10-CM | POA: Insufficient documentation

## 2019-09-23 DIAGNOSIS — H539 Unspecified visual disturbance: Secondary | ICD-10-CM | POA: Insufficient documentation

## 2019-09-23 DIAGNOSIS — Z7982 Long term (current) use of aspirin: Secondary | ICD-10-CM | POA: Insufficient documentation

## 2019-09-23 DIAGNOSIS — R42 Dizziness and giddiness: Secondary | ICD-10-CM | POA: Diagnosis present

## 2019-09-23 DIAGNOSIS — Z85828 Personal history of other malignant neoplasm of skin: Secondary | ICD-10-CM | POA: Diagnosis not present

## 2019-09-23 DIAGNOSIS — Z79899 Other long term (current) drug therapy: Secondary | ICD-10-CM | POA: Diagnosis not present

## 2019-09-23 LAB — DIFFERENTIAL
Abs Immature Granulocytes: 0.01 10*3/uL (ref 0.00–0.07)
Basophils Absolute: 0 10*3/uL (ref 0.0–0.1)
Basophils Relative: 1 %
Eosinophils Absolute: 0.2 10*3/uL (ref 0.0–0.5)
Eosinophils Relative: 5 %
Immature Granulocytes: 0 %
Lymphocytes Relative: 34 %
Lymphs Abs: 1.4 10*3/uL (ref 0.7–4.0)
Monocytes Absolute: 0.3 10*3/uL (ref 0.1–1.0)
Monocytes Relative: 8 %
Neutro Abs: 2.2 10*3/uL (ref 1.7–7.7)
Neutrophils Relative %: 52 %

## 2019-09-23 LAB — CBC
HCT: 43.3 % (ref 36.0–46.0)
Hemoglobin: 13.7 g/dL (ref 12.0–15.0)
MCH: 28.4 pg (ref 26.0–34.0)
MCHC: 31.6 g/dL (ref 30.0–36.0)
MCV: 89.6 fL (ref 80.0–100.0)
Platelets: 209 10*3/uL (ref 150–400)
RBC: 4.83 MIL/uL (ref 3.87–5.11)
RDW: 12.6 % (ref 11.5–15.5)
WBC: 4.1 10*3/uL (ref 4.0–10.5)
nRBC: 0 % (ref 0.0–0.2)

## 2019-09-23 LAB — PROTIME-INR
INR: 1 (ref 0.8–1.2)
Prothrombin Time: 12.6 seconds (ref 11.4–15.2)

## 2019-09-23 LAB — APTT: aPTT: 30 seconds (ref 24–36)

## 2019-09-23 LAB — COMPREHENSIVE METABOLIC PANEL
ALT: 15 U/L (ref 0–44)
AST: 25 U/L (ref 15–41)
Albumin: 4.2 g/dL (ref 3.5–5.0)
Alkaline Phosphatase: 55 U/L (ref 38–126)
Anion gap: 10 (ref 5–15)
BUN: 8 mg/dL (ref 8–23)
CO2: 25 mmol/L (ref 22–32)
Calcium: 9.5 mg/dL (ref 8.9–10.3)
Chloride: 105 mmol/L (ref 98–111)
Creatinine, Ser: 0.71 mg/dL (ref 0.44–1.00)
GFR calc Af Amer: >60 mL/min (ref >60)
GFR calc non Af Amer: >60 mL/min (ref >60)
Glucose, Bld: 139 mg/dL — ABNORMAL HIGH (ref 70–99)
Potassium: 3.8 mmol/L (ref 3.5–5.1)
Sodium: 140 mmol/L (ref 135–145)
Total Bilirubin: 0.8 mg/dL (ref 0.3–1.2)
Total Protein: 7.3 g/dL (ref 6.5–8.1)

## 2019-09-23 LAB — CBG MONITORING, ED: Glucose-Capillary: 94 mg/dL (ref 70–99)

## 2019-09-23 LAB — I-STAT CHEM 8, ED
BUN: 10 mg/dL (ref 8–23)
Calcium, Ion: 1.06 mmol/L — ABNORMAL LOW (ref 1.15–1.40)
Chloride: 106 mmol/L (ref 98–111)
Creatinine, Ser: 0.7 mg/dL (ref 0.44–1.00)
Glucose, Bld: 136 mg/dL — ABNORMAL HIGH (ref 70–99)
HCT: 41 % (ref 36.0–46.0)
Hemoglobin: 13.9 g/dL (ref 12.0–15.0)
Potassium: 3.8 mmol/L (ref 3.5–5.1)
Sodium: 143 mmol/L (ref 135–145)
TCO2: 28 mmol/L (ref 22–32)

## 2019-09-23 MED ORDER — SODIUM CHLORIDE 0.9% FLUSH: 3.0000 mL | Freq: Once | INTRAVENOUS | Status: DC

## 2019-09-23 MED ORDER — MECLIZINE HCL 12.5 MG PO TABS
12.5000 mg | ORAL_TABLET | Freq: Three times a day (TID) | ORAL | 0 refills | Status: DC | PRN
Start: 2019-09-23 — End: 2021-12-21

## 2019-09-23 MED ORDER — SODIUM CHLORIDE 0.9 % IV BOLUS
500.0000 mL | Freq: Once | INTRAVENOUS | Status: AC
  Administered 2019-09-23: 500 mL via INTRAVENOUS

## 2019-09-23 MED ORDER — MECLIZINE HCL 25 MG PO TABS
12.5000 mg | ORAL_TABLET | Freq: Once | ORAL | Status: AC
  Administered 2019-09-23: 12.5 mg via ORAL
  Filled 2019-09-23: qty 1

## 2019-09-23 NOTE — Discharge Instructions (Addendum)
You were seen in the emergency department for dizziness, likely vertigo.  You had blood work and a CAT scan of your head that did not show any serious findings.  This is likely peripheral vertigo and usually goes away in a few days.  We are prescribing you some meclizine which may help the symptoms.  Please follow-up with your doctor for close follow-up.  Return to the emergency department for any worsening or concerning symptoms.  We are also putting a referral into physical therapy as she may benefit from some vestibular rehab.

## 2019-09-23 NOTE — ED Triage Notes (Signed)
Pt states for 1 week has had a headache and last night at 7pm began feeling dizzy and blurry vision. Pt states when she woke up seems worse hard to look up due to feeling like she is seeing double. Pt has no weakness. Hypertensive in triage 196/94.

## 2019-09-23 NOTE — ED Provider Notes (Signed)
[facility] EMERGENCY DEPARTMENT Provider Note   CSN: [id number] Arrival date & time: 09/23/19  1109     History Chief Complaint  Patient presents with  . Dizziness    [name] is a 69 y.o. female.  She is complaining of headache that is been going on for a week.  She had been traveling and visiting grandchildren.  Starting last evening she began feeling dizzy which was a sensation of movement.  Seems to be associated with position of her head and eyes.  Symptoms are worse today and so she elected to come up..  She said she had vertigo years ago and feels somewhat like that.  She noticed her blood pressures been up.  No weakness or numbness.  The history is provided by the patient.  Dizziness Quality:  Head spinning and room spinning Severity:  Moderate Onset quality:  Gradual Duration:  2 days Timing:  Constant Progression:  Worsening Chronicity:  Recurrent Context: eye movement and head movement   Context: not with loss of consciousness   Relieved by:  Nothing Worsened by:  Eye movement, standing up and turning head Ineffective treatments:  Being still Associated symptoms: headaches and vision changes (blurry)   Associated symptoms: no chest pain, no diarrhea, no nausea, no shortness of breath, no syncope and no vomiting   Risk factors: heart disease and hx of vertigo        Past Medical History:  Diagnosis Date  . Basal cell carcinoma   . Hx of cardiac cath    a.  LHC (3/02): Normal coronary arteries. Left ventricular function was normal  //  b. LHC 06/29/15 - normal coronary arteries, EF 60-65%  . Hx of cardiovascular stress test    a. 7/10 - normal, EF 72% // b. Myoview 4/14 - low risk, apical thinning, no ischemia, EF 80% //  c. ETT-Myoview (11/15):  Normal stress nuclear study. No ischemia.  LV Ejection Fraction: 75%  . Hypercholesteremia   . Hypertension   . 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 possibly - normal cath 2017.  . Muscle cramp     . Pulmonary nodule    a. by CT 11/2017 with recommendation for f/u 6-12 mo  . Sinus problem   . Skin cancer    basal cell, squamous cell  . Syncope 2008   syncopal episode, probably related to excessive stress, activity, and fatigue        Patient Active Problem List   Diagnosis Date Noted  . Muscle spasm of both lower legs 03/21/2018  . Pain of lower leg 03/21/2018  . Muscle cramps 01/15/2018  . Pain of lower extremity 01/15/2018  . 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 Chest pain 12/17/2017  . Angina pectoris syndrome ([place]) 07/21/2010  . Hypertension 07/21/2010  . Hyperlipemia 07/21/2010    Past Surgical History:  Procedure Laterality Date  . ABDOMINAL HYSTERECTOMY    . BASAL CELL CARCINOMA EXCISION    . CARDIAC CATHETERIZATION  2002   normal  . CARDIAC CATHETERIZATION    . CARDIAC CATHETERIZATION N/A 06/29/2015   Procedure: Left Heart Cath and Coronary Angiography;  Surgeon: Larey Dresser, MD;  Location: [facility];  Service: Cardiovascular;  Laterality: N/A;  . CESAREAN SECTION    . CHOLECYSTECTOMY    . SQUAMOUS CELL CARCINOMA EXCISION       OB History   No obstetric history on file.     Family History  Problem Relation Age of  Onset  . Hypertension Father   . Heart attack Father 80  . Heart attack Mother        questionable  . Lung cancer Mother   . Coronary artery disease Other        strong family history   . Stroke Maternal Grandfather   . Diabetes Maternal Grandfather     Social History   Tobacco Use  . Smoking status: Never Smoker  . Smokeless tobacco: Never Used  Vaping Use  . Vaping Use: Never used  Substance Use Topics  . Alcohol use: No  . Drug use: No    Home Medications Prior to Admission medications   Medication Sig Start Date End Date Taking? Authorizing Provider  amLODipine (NORVASC) 5 MG tablet Take 1 tablet (5 mg total) by mouth daily. 08/20/19   Burnell Blanks, MD  aspirin EC 81 MG tablet Take 81 mg by  mouth at bedtime.     [provider]  isosorbide mononitrate (IMDUR) 30 MG 24 hr tablet Take 0.5 tablets (15 mg total) by mouth daily. 08/20/19   Burnell Blanks, MD  MAGNESIUM PO Take 200 mg by mouth daily.     [provider]  Multiple Vitamins-Minerals (CENTRUM SILVER PO) Take 1 tablet by mouth daily.    [provider]  nitroGLYCERIN (NITROSTAT) 0.4 MG SL tablet Place 1 tablet (0.4 mg total) under the tongue every 5 (five) minutes as needed for chest pain. 08/20/19   Burnell Blanks, MD  Potassium 99 MG TABS Take 99 mg by mouth daily.    [provider]    Allergies    Adhesive [tape], Aspirin, Lactose intolerance (gi), and Potassium-containing compounds  Review of Systems   Review of Systems  Constitutional: Negative for fever.  HENT: Negative for sore throat.   Eyes: Positive for visual disturbance. Negative for pain.  Respiratory: Negative for shortness of breath.   Cardiovascular: Negative for chest pain and syncope.  Gastrointestinal: Negative for abdominal pain, diarrhea, nausea and vomiting.  Genitourinary: Negative for dysuria.  Musculoskeletal: Negative for back pain.  Skin: Negative for rash.  Neurological: Positive for dizziness and headaches.    Physical Exam Updated Vital Signs BP (!) 159/77 (BP Location: Left Arm)   Pulse 62   Temp 98.8 F (37.1 C) (Oral)   Resp 17   SpO2 97%   Physical Exam Vitals and nursing note reviewed.  Constitutional:      General: She is not in acute distress.    Appearance: She is well-developed.  HENT:     Head: Normocephalic and atraumatic.  Eyes:     General: No visual field deficit.    Conjunctiva/sclera: Conjunctivae normal.  Cardiovascular:     Rate and Rhythm: Normal rate and regular rhythm.     Heart sounds: No murmur heard.   Pulmonary:     Effort: Pulmonary effort is normal. No respiratory distress.     Breath sounds: Normal breath sounds.  Abdominal:      Palpations: Abdomen is soft.     Tenderness: There is no abdominal tenderness.  Musculoskeletal:     Cervical back: Neck supple.  Skin:    General: Skin is warm and dry.  Neurological:     Mental Status: She is alert and oriented to person, place, and time.     GCS: GCS eye subscore is 4. GCS verbal subscore is 5. GCS motor subscore is 6.     Cranial Nerves: Cranial nerves are intact. No  facial asymmetry.     Sensory: Sensation is intact.     Motor: Motor function is intact.     Coordination: Coordination normal.     ED Results / Procedures / Treatments   Labs (all labs ordered are listed, but only abnormal results are displayed) Labs Reviewed  COMPREHENSIVE METABOLIC PANEL - Abnormal; Notable for the following components:      Result Value   Glucose, Bld 139 (*)    All other components within normal limits  I-STAT CHEM 8, ED - Abnormal; Notable for the following components:   Glucose, Bld 136 (*)    Calcium, Ion 1.06 (*)    All other components within normal limits  PROTIME-INR  APTT  CBC  DIFFERENTIAL  CBG MONITORING, ED    EKG EKG Interpretation  Date/Time:  Tuesday September 23 2019 14:06:51 EDT Ventricular Rate:  62 PR Interval:    QRS Duration: 90 QT Interval:  425 QTC Calculation: 432 R Axis:   19 Text Interpretation: Sinus rhythm No significant change since prior 10/19 Confirmed by Aletta Edouard [phone]) on 09/23/2019 2:09:16 PM   Radiology CT HEAD WO CONTRAST  Result Date: 09/23/2019 CLINICAL DATA:  Neuro deficit, acute, stroke suspected. Additional history provided: Patient reports left-sided headache, frontal region, photosensitivity, blurry vision. EXAM: CT HEAD WITHOUT CONTRAST TECHNIQUE: Contiguous axial images were obtained from the base of the skull through the vertex without intravenous contrast. COMPARISON:  Noncontrast head CT 09/06/2008. FINDINGS: Brain: Mild generalized parenchymal atrophy. Mild ill-defined hypoattenuation within the cerebral white  matter which is nonspecific, but consistent with chronic small vessel ischemic disease. These findings are similar as compared to the head CT of 09/06/2008. There is no acute intracranial hemorrhage. No demarcated cortical infarct. No extra-axial fluid collection. No evidence of intracranial mass. No midline shift. Vascular: No hyperdense vessel.  Atherosclerotic calcifications. Skull: Normal. Negative for fracture or focal lesion. Sinuses/Orbits: Visualized orbits show no acute finding. Mild ethmoid and right sphenoid sinus mucosal thickening. No significant mastoid effusion. IMPRESSION: No evidence of acute intracranial abnormality. Mild generalized parenchymal atrophy and chronic small vessel ischemic changes, stable as compared to the head CT of 09/06/2008. Mild ethmoid and right sphenoid sinus mucosal thickening. Electronically Signed   By: Kellie Simmering DO   On: 09/23/2019 14:12    Procedures Procedures (including critical care time)  Medications Ordered in ED Medications  sodium chloride flush (NS) 0.9 % injection 3 mL (has no administration in time range)  sodium chloride 0.9 % bolus 500 mL (has no administration in time range)  meclizine (ANTIVERT) tablet 12.5 mg (has no administration in time range)    ED Course  I have reviewed the triage vital signs and the nursing notes.  Pertinent labs & imaging results that were available during my care of the patient were reviewed by me and considered in my medical decision making (see chart for details).  Clinical Course as of Sep 23 1750  Tue Sep 23, 2019  1607 Went back to reevaluate patient she is eating lunch.  She is able to move her head around and does not having any dizziness although she said it happened to her briefly when she looked up.  Reviewed her work-up with her and she is comfortable with plan for discharge and outpatient follow-up with her PCP and physical therapy.  Return instructions discussed   [MB]    Clinical Course User  Index [MB] Hayden Rasmussen, MD   MDM Rules/Calculators/A&P  This patient complains of dizziness and vertigo, generalized headache; this involves an extensive number of treatment Options and is a complaint that carries with it a high risk of complications and Morbidity. The differential includes vertigo, stroke, bleed, arrhythmia, metabolic derangement, anemia  I ordered, reviewed and interpreted labs, which included CBC with normal white count normal hemoglobin, chemistries and LFTs normal other than mildly elevated glucose, normal INR I ordered medication IV fluids, meclizine with improvement in her dizziness I ordered imaging studies which included CT head and I independently    visualized and interpreted imaging which showed no acute stroke Additional history obtained from patient's husband Previous records obtained and reviewed in epic, no recent visit for similar complaint  After the interventions stated above, I reevaluated the patient and found patient to be symptomatically improved.  Reviewed results of her work-up with her and her husband.  Explained the limitations of a head CT and the possible need for further work-up.  She is comfortable with going home and following up with her PCP tomorrow as scheduled.  Return instructions discussed.   Final Clinical Impression(s) / ED Diagnoses Final diagnoses:  Vertigo  Essential hypertension  Generalized headache    Rx / DC Orders ED Discharge Orders         Ordered    meclizine (ANTIVERT) 12.5 MG tablet  3 times daily PRN     Discontinue  Reprint     09/23/19 1604    Ambulatory referral to Physical Therapy     Discontinue  Reprint    Comments: Vestibular rehab for vertigo   09/23/19 1605           Hayden Rasmussen, MD 09/23/19 1755

## 2019-09-23 NOTE — ED Notes (Signed)
Patient Alert and oriented to baseline. Stable and ambulatory to baseline. Patient verbalized understanding of the discharge instructions.  Patient belongings were taken by the patient.   

## 2019-09-29 ENCOUNTER — Other Ambulatory Visit: Payer: Self-pay | Admitting: Cardiovascular Disease

## 2019-09-29 MED FILL — AMLODIPINE BESYLATE 5 MG TA: 5 | 90 days supply | Qty: 90 | Fill #0

## 2019-11-19 MED FILL — ISOSORBIDE MN ER 30 MG TAB: 30 | 90 days supply | Qty: 45 | Fill #1

## 2019-12-14 IMAGING — DX DG CHEST 2V
2 series · 2 of 2 positions shown · non-contrast
Comparison: 06/22/2012

CLINICAL DATA: Intermittent mid chest pain recently, worse today.
Lightheadedness and nausea.

EXAM:
CHEST - 2 VIEW

[chest pa]
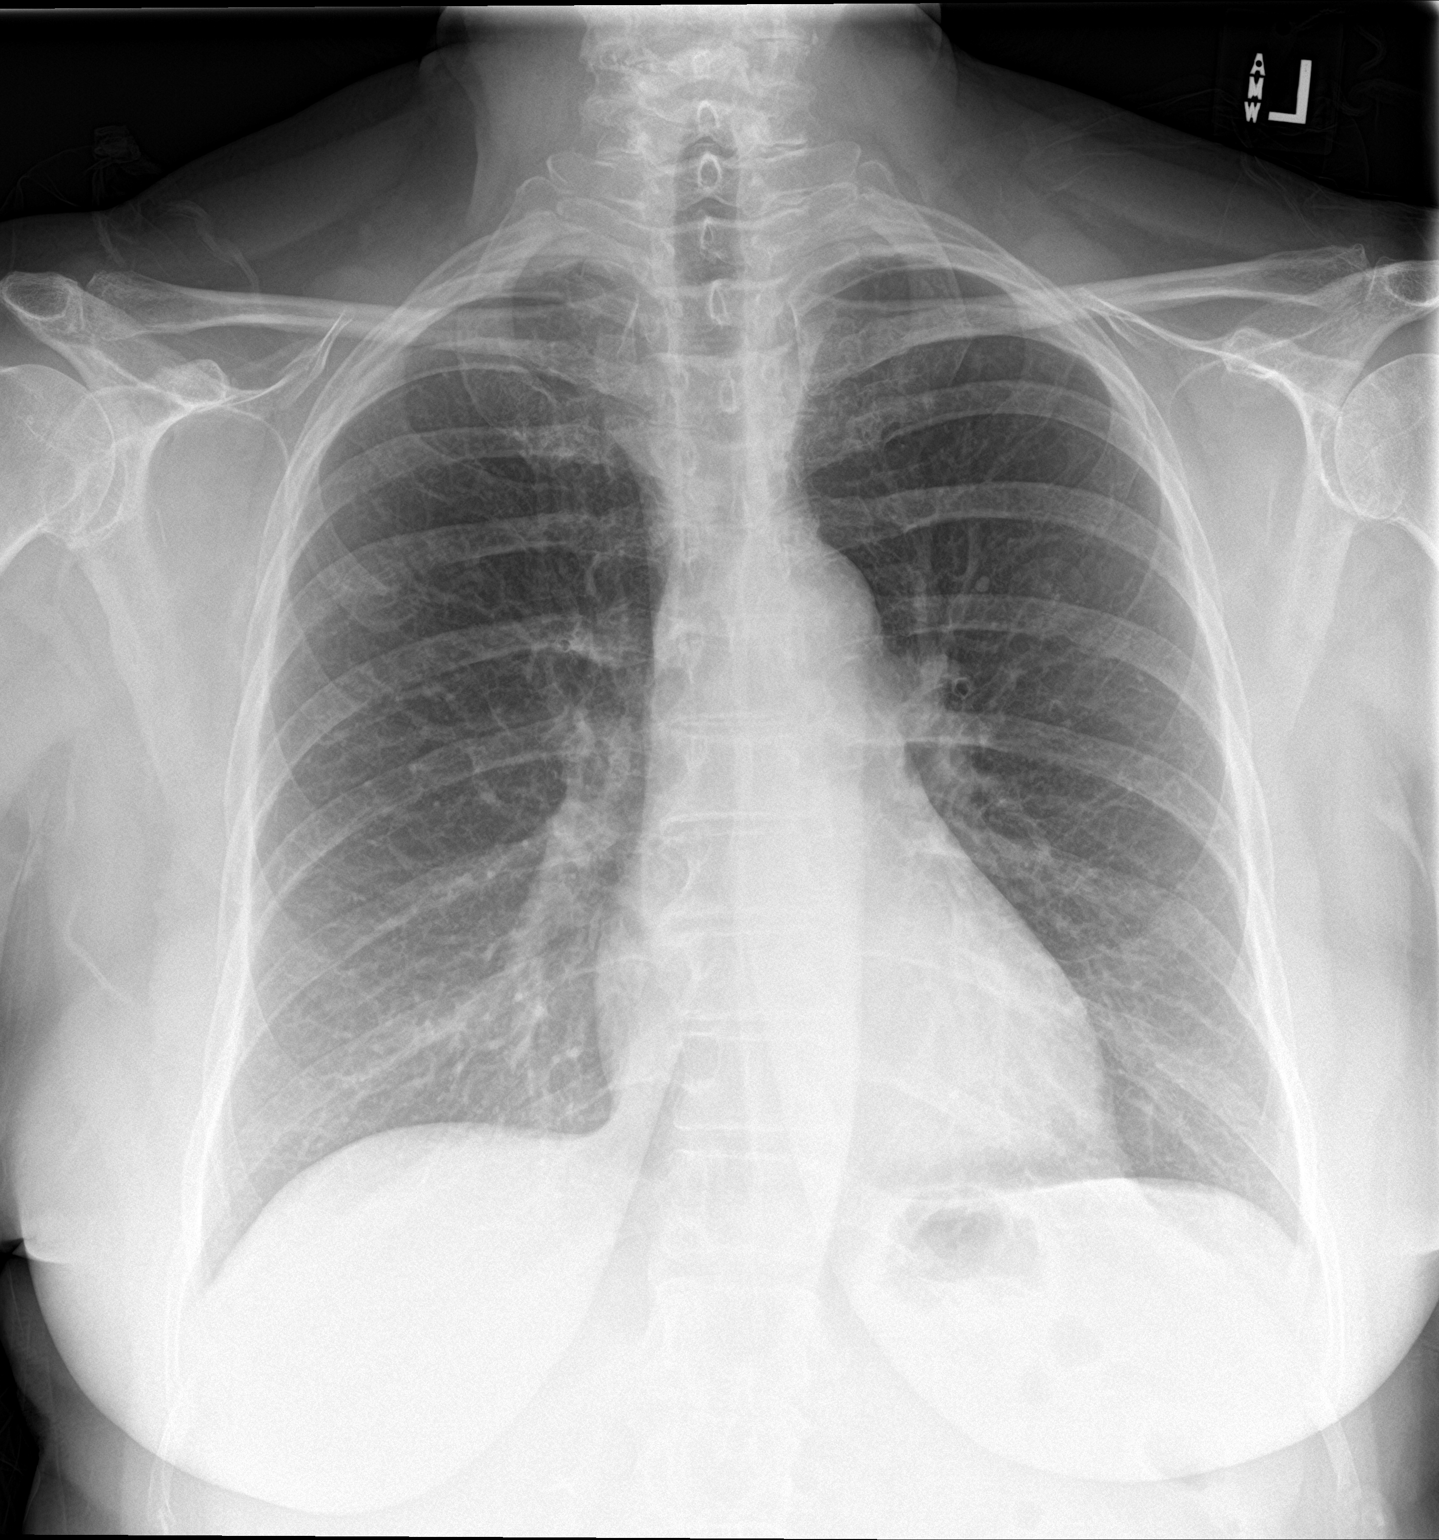

[chest lat]
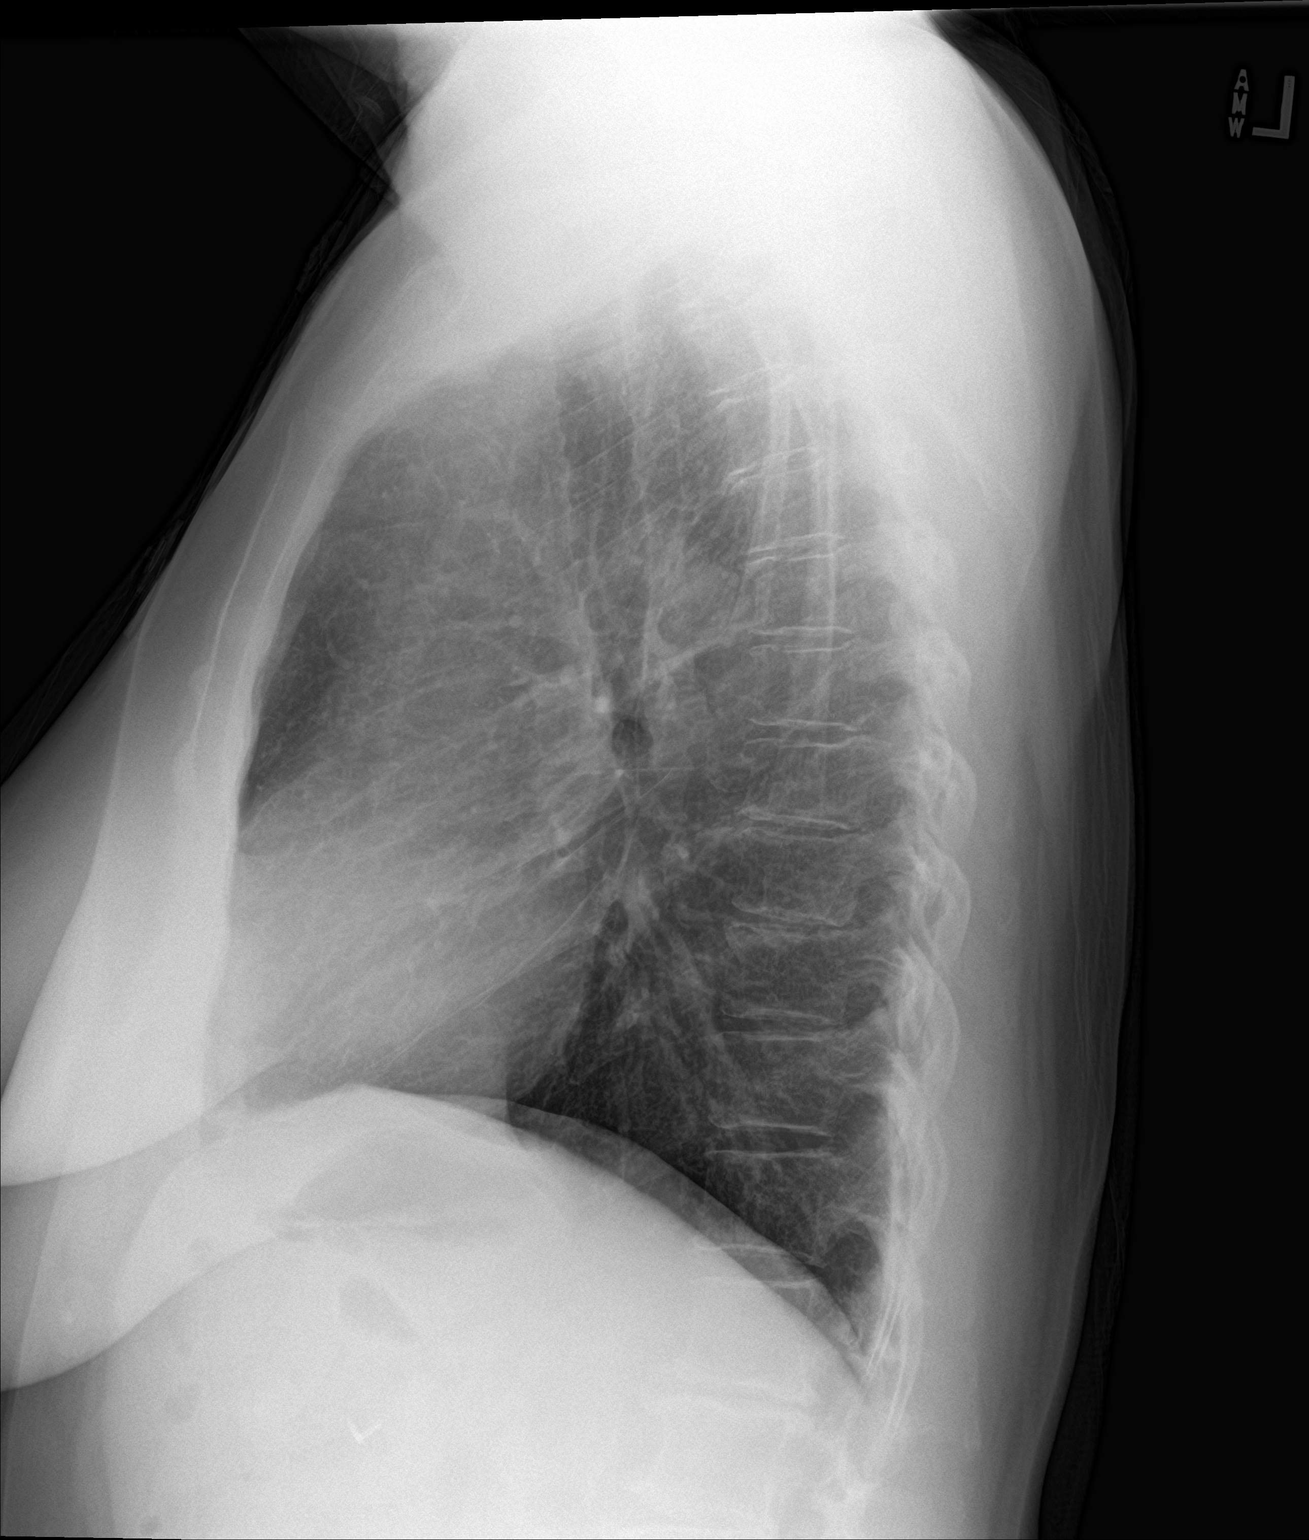

[2 of 2 positions shown; findings below may reference images not displayed]

FINDINGS: The cardiomediastinal silhouette is within normal limits. The lungs
are well inflated and clear. There is no evidence of pleural
effusion or pneumothorax. No acute osseous abnormality is
identified. Right upper quadrant abdominal surgical clips are noted.
IMPRESSION: No active cardiopulmonary disease.

## 2019-12-14 IMAGING — CT CT ANGIO CHEST-ABD-PELV FOR DISSECTION W/ AND WO/W CM
2 of 7 series · 14 of 46 positions shown, 16 images · IV contrast (APPLIED)
Comparison: CT abdomen 04/23/2003

CLINICAL DATA: Intermittent chest pain worsening today. Bilateral
leg pain and cramping.

EXAM:
CT ANGIOGRAPHY CHEST, ABDOMEN AND PELVIS
TECHNIQUE: Multidetector CT imaging through the chest, abdomen and pelvis was
performed using the standard protocol during bolus administration of
intravenous contrast. Multiplanar reconstructed images and MIPs were
obtained and reviewed to evaluate the vascular anatomy.
CONTRAST:  100mL [id number] IOPAMIDOL ([id number]) INJECTION 76%

[Series 6: arterial · axial · arterial · 0.96mm/px · z∈[+730,+1310]mm · 11 of 328 slices shown, 13 images]
[im 19/328  soft-tissue]
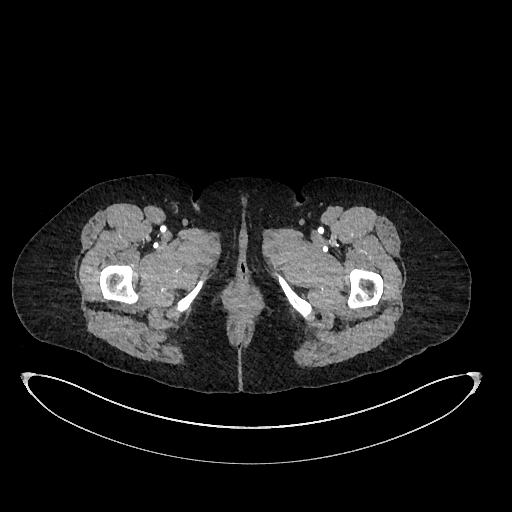
[im 19/328  bone]
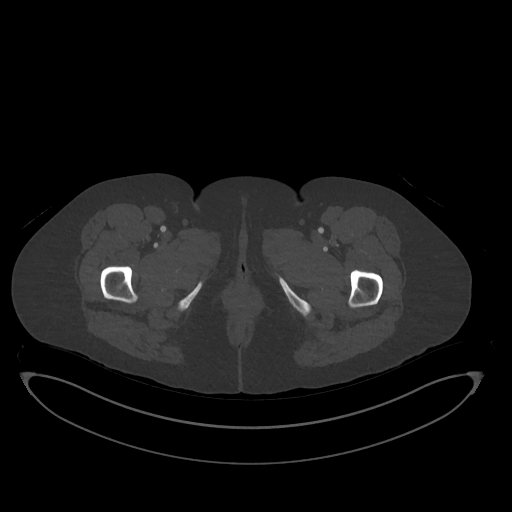
[im 55/328  soft-tissue]
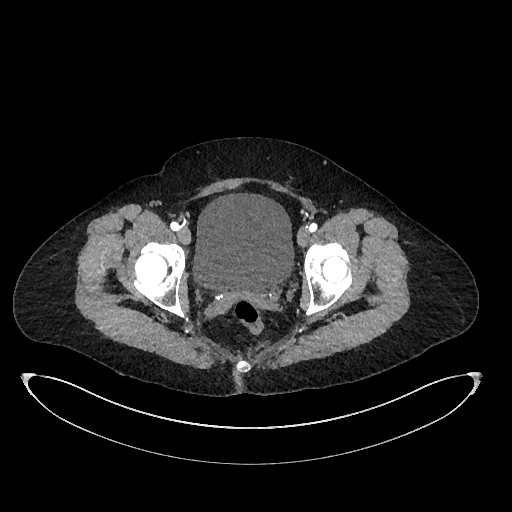
[im 73/328  soft-tissue]
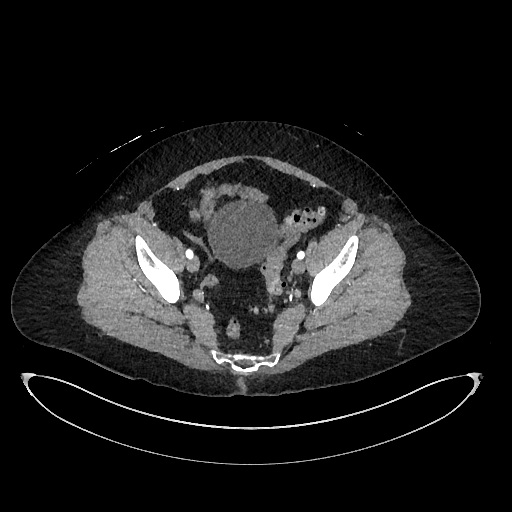
[im 110/328  soft-tissue]
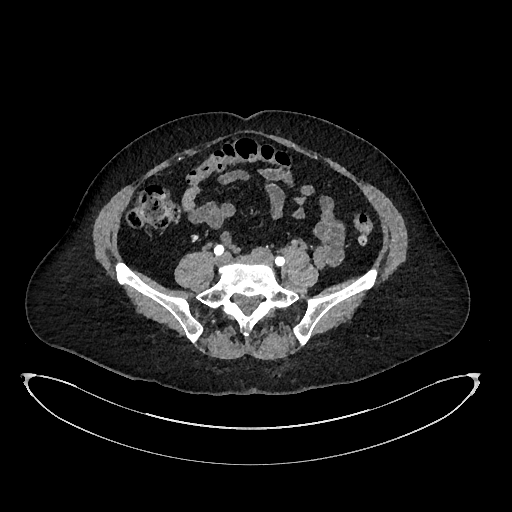
[im 128/328  soft-tissue]
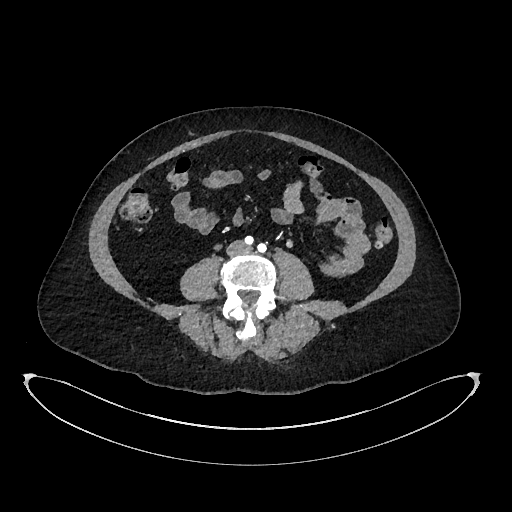
[im 164/328  soft-tissue]
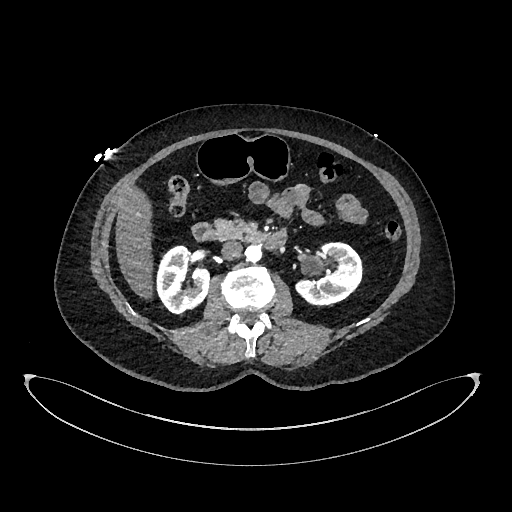
[im 200/328  soft-tissue]
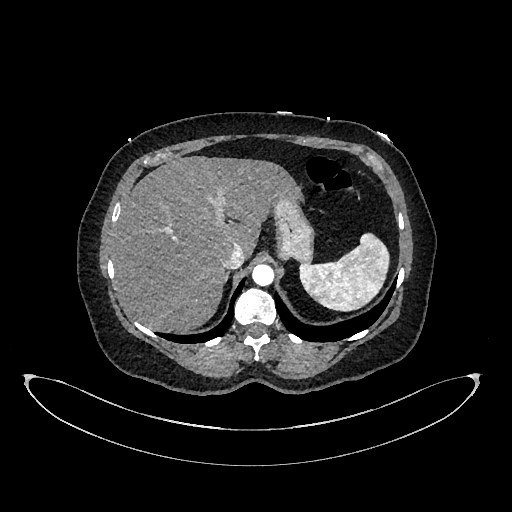
[im 219/328  soft-tissue]
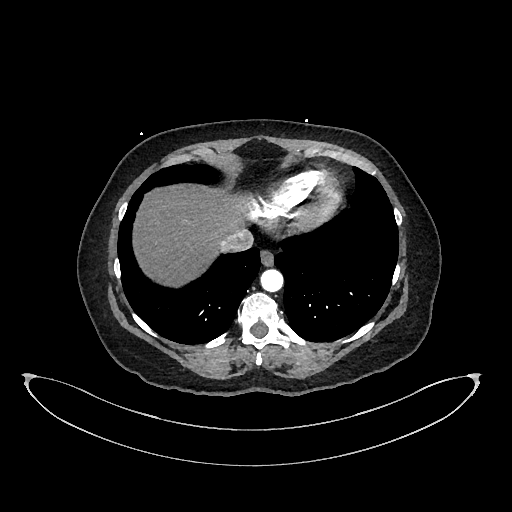
[im 255/328  soft-tissue]
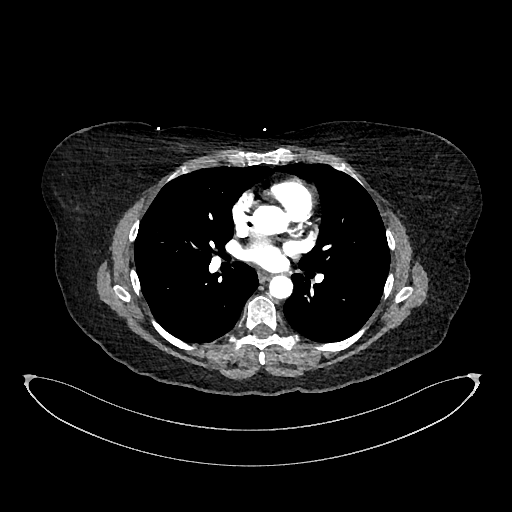
[im 255/328  bone]
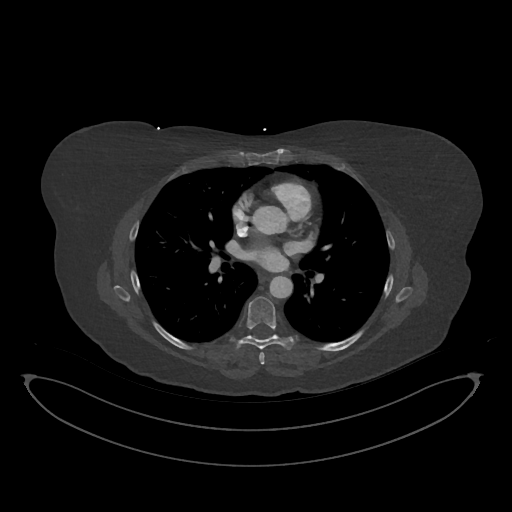
[im 273/328  soft-tissue]
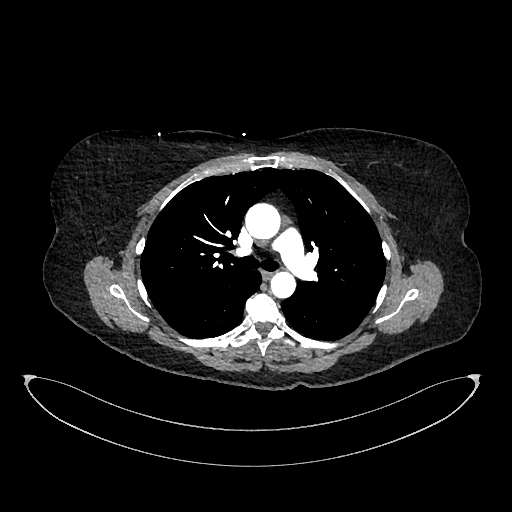
[im 309/328  soft-tissue]
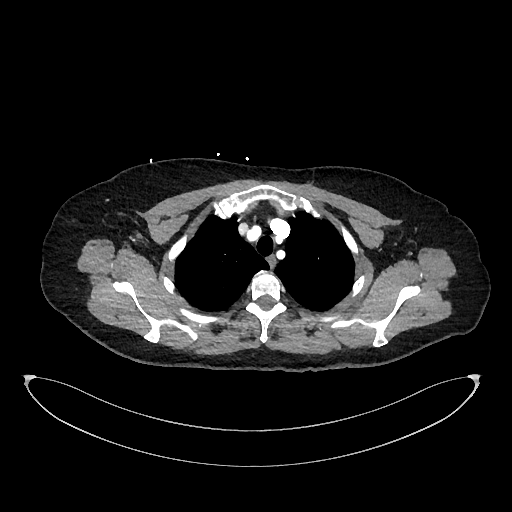

[Series 9: cor · coronal · 0.96mm/px · 3 of 151 slices shown]
[im 38/151  soft-tissue]
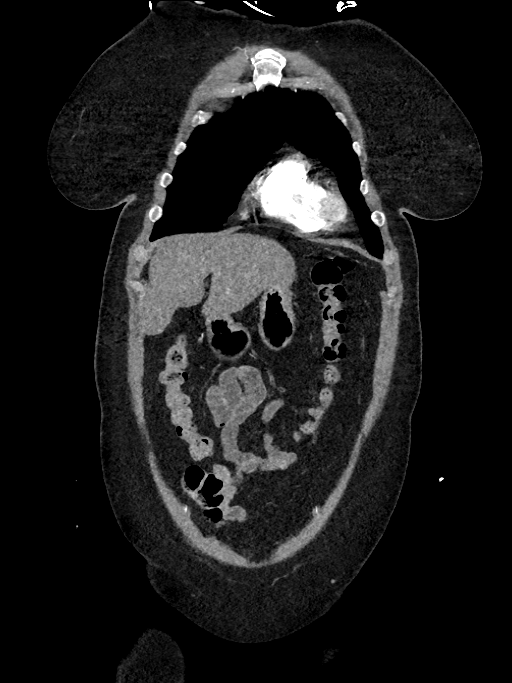
[im 76/151  soft-tissue]
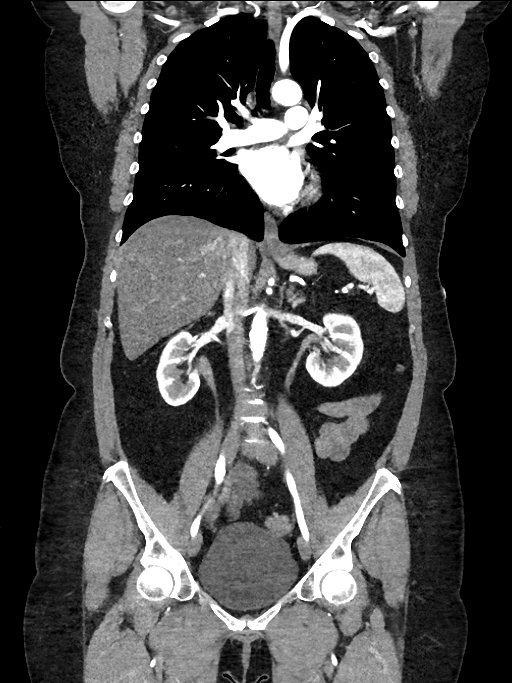
[im 113/151  soft-tissue]
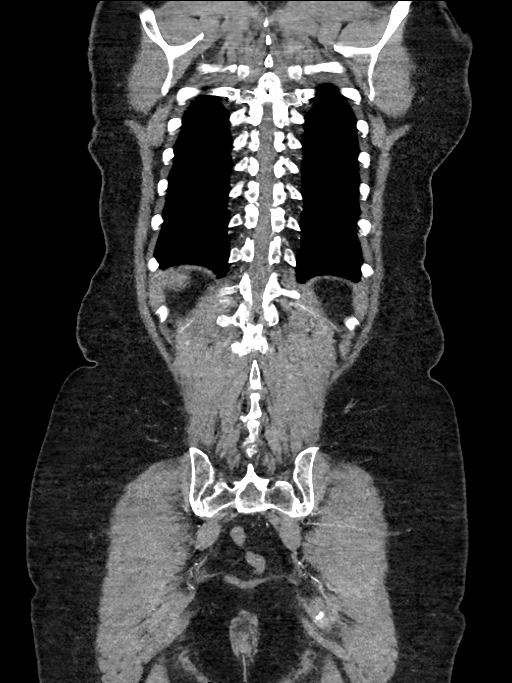

[14 of 46 positions shown; findings below may reference images not displayed]

FINDINGS: CTA CHEST FINDINGS

Cardiovascular: The initial noncontrast images demonstrate no acute
intramural hematoma in the thoracic aorta. Minimal atherosclerotic
calcification of the aortic arch and left anterior descending
coronary artery.

No dissection or aneurysm. Although today's exam was optimized for
systemic arterial opacification, pulmonary arterial opacification is
certainly adequate to exclude large or moderate-sized pulmonary
embolus. No cardiomegaly or pericardial fluid.

Mediastinum/Nodes: Unremarkable

Lungs/Pleura: Mild biapical pleuroparenchymal scarring.

Ground-glass density pulmonary nodule measuring 0.8 by 0.6 cm
posteriorly in the left upper lobe on image [DATE].

Densely calcified 5 mm granuloma in the left lower lobe on image
94/7.

Musculoskeletal: Mild thoracic spondylosis.

Review of the MIP images confirms the above findings.

CTA ABDOMEN AND PELVIS FINDINGS

VASCULAR

Aorta: Aortoiliac atherosclerotic vascular disease.

Celiac: The celiac trunk and its branches appear normal.

SMA: Unremarkable

Renals: Single bilateral renal arteries appear normal.

IMA: Patent.

Inflow: Minimal atherosclerotic vascular calcification in the common
iliac arteries.

Veins: Unremarkable

Review of the MIP images confirms the above findings.

NON-VASCULAR

Hepatobiliary: Cholecystectomy. Mild hepatic steatosis with slight
sparing along the gallbladder fossa. Otherwise unremarkable.

Pancreas: Unremarkable

Spleen: Unremarkable

Adrenals/Urinary Tract: Numerous vascular calcifications adjacent to
the distal ureters but no definite urinary tract calculi. No
hydronephrosis. Urinary bladder normal. Left kidney lower pole
peripelvic renal cyst.

Stomach/Bowel: Descending and sigmoid colon diverticulosis without
active diverticulitis.

Lymphatic: Unremarkable

Reproductive: Uterus absent.  Adnexa unremarkable.

Other: No supplemental non-categorized findings.

Musculoskeletal: Disc bulge and intervertebral spurring at L1-2
without overt impingement.

Review of the MIP images confirms the above findings.
IMPRESSION: 1. No acute vascular findings. There is mild thoracic and abdominal
aortic atherosclerotic vascular calcification along with faint
punctate calcification in the left anterior descending coronary
artery.
2. Ground-glass density pulmonary nodule in the left upper lobe
measuring 0.7 cm in average diameter. Initial follow-up with CT at
6-12 months is recommended to confirm persistence. If persistent,
repeat CT is recommended every 2 years until 5 years of stability
has been established. This recommendation follows the consensus
statement: Guidelines for Management of Incidental Pulmonary Nodules
Detected on CT Images: From the [HOSPITAL] 1724; Radiology
1724; [DATE].
3. Mild thoracic spondylosis along with spurring and degenerative
disc disease at L1-2, but without overt impingement.
4. Descending and sigmoid colon diverticulosis.

## 2019-12-18 ENCOUNTER — Other Ambulatory Visit (HOSPITAL_COMMUNITY): Payer: Self-pay | Admitting: Family Medicine

## 2019-12-18 MED FILL — ROSUVASTATIN CALCIUM 20 MG: 20 | 30 days supply | Qty: 30 | Fill #0

## 2019-12-24 MED FILL — AMLODIPINE BESYLATE 5 MG TA: 5 | 90 days supply | Qty: 90 | Fill #1

## 2020-01-08 MED FILL — ROSUVASTATIN CALCIUM 20 MG: 20 | 30 days supply | Qty: 30 | Fill #1

## 2020-02-02 MED FILL — ROSUVASTATIN CALCIUM 20 MG: 20 | 30 days supply | Qty: 30 | Fill #2

## 2020-02-23 MED FILL — ISOSORBIDE MN ER 30 MG TAB: 30 | 90 days supply | Qty: 45 | Fill #2

## 2020-03-05 MED FILL — ROSUVASTATIN CALCIUM 20 MG: 20 | 30 days supply | Qty: 30 | Fill #3

## 2020-04-01 MED FILL — ROSUVASTATIN CALCIUM 20 MG: 20 | 30 days supply | Qty: 30 | Fill #4

## 2020-04-01 MED FILL — AMLODIPINE BESYLATE 5 MG TA: 5 | 90 days supply | Qty: 90 | Fill #2

## 2020-05-02 LAB — COLOGUARD: COLOGUARD: NEGATIVE

## 2020-05-28 ENCOUNTER — Other Ambulatory Visit (HOSPITAL_COMMUNITY): Payer: Self-pay

## 2020-05-28 MED FILL — Isosorbide Mononitrate Tab ER 24HR 30 MG: ORAL | 90 days supply | Qty: 45 | Fill #0 | Status: AC

## 2020-06-01 ENCOUNTER — Other Ambulatory Visit (HOSPITAL_COMMUNITY): Payer: Self-pay

## 2020-06-02 ENCOUNTER — Other Ambulatory Visit (HOSPITAL_COMMUNITY): Payer: Self-pay

## 2020-06-02 MED ORDER — ROSUVASTATIN CALCIUM 20 MG PO TABS
20.0000 mg | ORAL_TABLET | Freq: Every day | ORAL | 5 refills | Status: DC
  Filled 2020-06-02 – 2020-06-11 (×2): qty 30, 30d supply, fill #0
  Filled 2020-07-10: qty 30, 30d supply, fill #1
  Filled 2020-08-13: qty 30, 30d supply, fill #2
  Filled 2020-09-07: qty 30, 30d supply, fill #3
  Filled 2020-10-13: qty 30, 30d supply, fill #4
  Filled 2020-11-11: qty 30, 30d supply, fill #5

## 2020-06-03 ENCOUNTER — Other Ambulatory Visit (HOSPITAL_COMMUNITY): Payer: Self-pay

## 2020-06-04 ENCOUNTER — Other Ambulatory Visit (HOSPITAL_COMMUNITY): Payer: Self-pay

## 2020-06-10 ENCOUNTER — Other Ambulatory Visit (HOSPITAL_COMMUNITY): Payer: Self-pay

## 2020-06-11 ENCOUNTER — Other Ambulatory Visit (HOSPITAL_COMMUNITY): Payer: Self-pay

## 2020-06-28 ENCOUNTER — Other Ambulatory Visit (HOSPITAL_COMMUNITY): Payer: Self-pay

## 2020-06-28 MED FILL — Amlodipine Besylate Tab 5 MG (Base Equivalent): ORAL | 90 days supply | Qty: 90 | Fill #0 | Status: AC

## 2020-06-29 ENCOUNTER — Other Ambulatory Visit: Payer: Self-pay | Admitting: Family Medicine

## 2020-06-29 DIAGNOSIS — Z1231 Encounter for screening mammogram for malignant neoplasm of breast: Secondary | ICD-10-CM

## 2020-07-02 ENCOUNTER — Other Ambulatory Visit (HOSPITAL_COMMUNITY): Payer: Self-pay

## 2020-07-12 ENCOUNTER — Other Ambulatory Visit (HOSPITAL_COMMUNITY): Payer: Self-pay

## 2020-08-13 ENCOUNTER — Other Ambulatory Visit (HOSPITAL_COMMUNITY): Payer: Self-pay

## 2020-08-24 ENCOUNTER — Inpatient Hospital Stay: Admission: RE | Admit: 2020-08-24 | Payer: Medicare Other | Source: Ambulatory Visit

## 2020-09-03 ENCOUNTER — Other Ambulatory Visit (HOSPITAL_COMMUNITY): Payer: Self-pay

## 2020-09-03 ENCOUNTER — Other Ambulatory Visit: Payer: Self-pay | Admitting: Cardiovascular Disease

## 2020-09-03 MED ORDER — ISOSORBIDE MONONITRATE ER 30 MG PO TB24
15.0000 mg | ORAL_TABLET | Freq: Every day | ORAL | 0 refills | Status: DC
  Filled 2020-09-03: qty 15, 30d supply, fill #0

## 2020-09-08 ENCOUNTER — Other Ambulatory Visit (HOSPITAL_COMMUNITY): Payer: Self-pay

## 2020-09-09 ENCOUNTER — Other Ambulatory Visit (HOSPITAL_COMMUNITY): Payer: Self-pay

## 2020-09-29 ENCOUNTER — Other Ambulatory Visit: Payer: Self-pay | Admitting: Cardiovascular Disease

## 2020-09-30 ENCOUNTER — Other Ambulatory Visit: Payer: Self-pay | Admitting: Cardiovascular Disease

## 2020-09-30 ENCOUNTER — Other Ambulatory Visit (HOSPITAL_COMMUNITY): Payer: Self-pay

## 2020-09-30 MED ORDER — AMLODIPINE BESYLATE 5 MG PO TABS
5.0000 mg | ORAL_TABLET | Freq: Every day | ORAL | 0 refills | Status: DC
  Filled 2020-09-30: qty 90, 90d supply, fill #0

## 2020-09-30 MED ORDER — ISOSORBIDE MONONITRATE ER 30 MG PO TB24
15.0000 mg | ORAL_TABLET | Freq: Every day | ORAL | 0 refills | Status: DC
  Filled 2020-09-30: qty 15, 30d supply, fill #0

## 2020-10-13 ENCOUNTER — Other Ambulatory Visit (HOSPITAL_COMMUNITY): Payer: Self-pay

## 2020-10-29 ENCOUNTER — Other Ambulatory Visit: Payer: Self-pay

## 2020-10-29 ENCOUNTER — Ambulatory Visit
Admission: RE | Admit: 2020-10-29 | Discharge: 2020-10-29 | Disposition: A | Discharge status: Home / Self Care | Payer: Medicare Other | Source: Ambulatory Visit | Attending: Family Medicine | Admitting: Family Medicine

## 2020-10-29 DIAGNOSIS — Z1231 Encounter for screening mammogram for malignant neoplasm of breast: Secondary | ICD-10-CM

## 2020-11-08 ENCOUNTER — Other Ambulatory Visit: Payer: Self-pay | Admitting: Cardiovascular Disease

## 2020-11-08 ENCOUNTER — Other Ambulatory Visit (HOSPITAL_COMMUNITY): Payer: Self-pay

## 2020-11-08 MED ORDER — ISOSORBIDE MONONITRATE ER 30 MG PO TB24
15.0000 mg | ORAL_TABLET | Freq: Every day | ORAL | 0 refills | Status: DC
  Filled 2020-11-08: qty 15, 30d supply, fill #0

## 2020-11-12 ENCOUNTER — Other Ambulatory Visit (HOSPITAL_COMMUNITY): Payer: Self-pay

## 2020-12-09 ENCOUNTER — Other Ambulatory Visit (HOSPITAL_COMMUNITY): Payer: Self-pay

## 2020-12-09 ENCOUNTER — Telehealth: Payer: Self-pay | Admitting: Cardiovascular Disease

## 2020-12-09 MED ORDER — ISOSORBIDE MONONITRATE ER 30 MG PO TB24
15.0000 mg | ORAL_TABLET | Freq: Every day | ORAL | 0 refills | Status: DC
  Filled 2020-12-09: qty 15, 30d supply, fill #0

## 2020-12-09 NOTE — Telephone Encounter (Signed)
*  STAT* If patient is at the pharmacy, call can be transferred to refill team.   1. Which medications need to be refilled? (please list name of each medication and dose if known)  isosorbide mononitrate (IMDUR) 30 MG 24 hr tablet  2. Which pharmacy/location (including street and city if local pharmacy) is medication to be sent to? [facility]  3. Do they need a 30 day or 90 day supply?  90 day supply   Pt has an upcoming appt 12/14/20

## 2020-12-14 ENCOUNTER — Other Ambulatory Visit: Payer: Self-pay

## 2020-12-14 ENCOUNTER — Ambulatory Visit (INDEPENDENT_AMBULATORY_CARE_PROVIDER_SITE_OTHER): Payer: Medicare Other | Admitting: Physician Assistant

## 2020-12-14 ENCOUNTER — Other Ambulatory Visit (HOSPITAL_COMMUNITY): Payer: Self-pay

## 2020-12-14 ENCOUNTER — Encounter: Payer: Self-pay | Admitting: Physician Assistant

## 2020-12-14 VITALS — BP 132/70 | HR 69 | Ht 64.5 in | Wt 148.0 lb

## 2020-12-14 DIAGNOSIS — E782 Mixed hyperlipidemia: Secondary | ICD-10-CM

## 2020-12-14 DIAGNOSIS — I10 Essential (primary) hypertension: Secondary | ICD-10-CM

## 2020-12-14 DIAGNOSIS — I208 Other forms of angina pectoris: Secondary | ICD-10-CM

## 2020-12-14 MED ORDER — ROSUVASTATIN CALCIUM 20 MG PO TABS
20.0000 mg | ORAL_TABLET | Freq: Every day | ORAL | 3 refills | Status: DC
  Filled 2020-12-14: qty 90, 90d supply, fill #0
  Filled 2021-03-14: qty 90, 90d supply, fill #1
  Filled 2021-06-13: qty 90, 90d supply, fill #2
  Filled 2021-09-08: qty 90, 90d supply, fill #3

## 2020-12-14 NOTE — Assessment & Plan Note (Signed)
Blood pressure is well controlled on a combination of amlodipine, isosorbide mononitrate.

## 2020-12-14 NOTE — Progress Notes (Signed)
Cardiology Office Note:    Date:  12/14/2020   ID:  [name], DOB [birth year], MRN [id number]  PCP:  Chesley Noon, MD   [facility] Providers Cardiologist:  Lauree Chandler, MD    Referring MD: Chesley Noon, MD   Chief Complaint:  Follow-up for microvascular angina    Patient Profile:   [name] is a 70 y.o. female with:  Microvascular angina Chronic chest pain-associated with left arm pain and dyspnea Intolerant of nitrates in past >> able to tol now Metoprolol DC'd due to brady Ranolazine too expensive Rx: Amlodipine Hypertension Hyperlipidemia  History of Present Illness: [name] Was last seen by Dr. Angelena Form in 6/21.  She was continued on isosorbide and amlodipine at that time.  She returns for follow-up.  She is here alone.  She continues to do very well without symptoms of angina.  She has not had shortness of breath, syncope.  She exercises on a daily basis.  She usually walks on a treadmill in the morning for 30 minutes and does weightlifting in the evenings.      ASSESSMENT & PLAN:   Microvascular angina [facility]) Cardiac catheterization in 2017 with normal coronary arteries.  She continues to do well on a combination of amlodipine 5 mg daily and isosorbide mononitrate 15 mg daily.  She is also continued to feel much better since she started her exercise regimen.  She exercises twice a day on a daily basis.  Continue current therapy.  Follow-up in 1 year.  Essential hypertension Blood pressure is well controlled on a combination of amlodipine, isosorbide mononitrate.  Mixed hyperlipidemia Recent LDL 123.  She was placed back on rosuvastatin by primary care.  Dispo:  Return in about 1 year (around 12/14/2021) for Routine Follow Up with Dr. Angelena Form, or Richardson Dopp, PA-C.    Prior CV studies: Echocardiogram 02/26/2018 EF 65-70, normal wall motion, GR 1 DD, trivial MR  Cardiac catheterization 06/29/2015 No angiographic CAD EF  60-65  Myoview 11-15 EF 75, no ischemia     Past Medical History:  Diagnosis Date   Basal cell carcinoma    Hx of cardiac cath    a.  LHC (3/02):  Normal coronary arteries.  Left ventricular function was normal  //  b. LHC 06/29/15 - normal coronary arteries, EF 60-65%   Hx of cardiovascular stress test    a. 7/10 - normal, EF 72% // b. Myoview 4/14 - low risk, apical thinning, no ischemia, EF 80% //  c. ETT-Myoview (11/15):  Normal stress nuclear study. No ischemia.  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 possibly - normal cath 2017.   Muscle cramp    Pulmonary nodule    a. by CT 11/2017 with recommendation for f/u 6-12 mo   Sinus problem    Skin cancer    basal cell, squamous cell   Syncope 2008   syncopal episode, probably related to excessive stress, activity, and fatigue       Current Medications: Current Meds  Medication Sig   amLODipine (NORVASC) 5 MG tablet Take 1 tablet (5 mg total) by mouth daily. Please make an appt for future refills, thank you   aspirin EC 81 MG tablet Take 81 mg by mouth at bedtime.    BIOTIN PO Take 5,000 mg by mouth daily.   isosorbide mononitrate (IMDUR) 30 MG 24 hr tablet Take 0.5 tablets (15 mg  total) by mouth daily.   MAGNESIUM PO Take 200 mg by mouth daily.    meclizine (ANTIVERT) 12.5 MG tablet Take 1 tablet (12.5 mg total) by mouth 3 (three) times daily as needed for dizziness.   Multiple Vitamins-Minerals (CENTRUM SILVER PO) Take 1 tablet by mouth daily.   nitroGLYCERIN (NITROSTAT) 0.4 MG SL tablet Place 1 tablet (0.4 mg total) under the tongue every 5 (five) minutes as needed for chest pain.   Potassium 99 MG TABS Take 2 tablets by mouth daily.    [DISCONTINUED] rosuvastatin (CRESTOR) 20 MG tablet Take 1 tablet (20 mg total) by mouth at bedtime.    Allergies:   Adhesive [tape], Aspirin, Lactose intolerance (gi), and Potassium-containing compounds   Social History   Tobacco  Use   Smoking status: Never   Smokeless tobacco: Never  Vaping Use   Vaping Use: Never used  Substance Use Topics   Alcohol use: No   Drug use: No    Family Hx: The patient's family history includes Coronary artery disease in an other family member; Diabetes in her maternal grandfather; Heart attack in her mother; Heart attack (age of onset: 88) in her father; Hypertension in her father; Lung cancer in her mother; Stroke in her maternal grandfather.  Review of Systems  Musculoskeletal:  Positive for muscle cramps (minimal).    EKGs/Labs/Other Test Reviewed:    EKG:  EKG is  ordered today.  The ekg ordered today demonstrates NSR, HR 69, normal axis, nonspecific ST-T wave changes, QTC 430  Recent Labs: No results found for requested labs within last 8760 hours.   Recent Lipid Panel Lab Results  Component Value Date/Time   CHOL 143 01/24/2017 08:56 AM   TRIG 107 01/24/2017 08:56 AM   HDL 57 01/24/2017 08:56 AM   LDLCALC 65 01/24/2017 08:56 AM   LDLDIRECT 102.3 10/17/2011 09:41 AM   Labs obtained through [facility]- personally reviewed and interpreted: 06/16/2020: Total cholesterol 197, triglycerides 76, HDL 60, LDL 123, creatinine 0.77, K+ 4.4, ALT 12   Risk Assessment/Calculations:          Physical Exam:    VS:  BP 132/70   Pulse 69   Ht 5' 4.5" (1.638 m)   Wt 148 lb (67.1 kg)   SpO2 96%   BMI 25.01 kg/m     Wt Readings from Last 3 Encounters:  12/14/20 148 lb (67.1 kg)  08/20/19 144 lb 9.6 oz (65.6 kg)  08/05/18 157 lb (71.2 kg)    Constitutional:      Appearance: Healthy appearance. Not in distress.  Neck:     Vascular: No carotid bruit. JVD normal.  Pulmonary:     Effort: Pulmonary effort is normal.     Breath sounds: No wheezing. No rales.  Cardiovascular:     Normal rate. Regular rhythm. Normal S1. Normal S2.      Murmurs: There is no murmur.  Pulses:    Intact distal pulses.  Edema:    Peripheral edema absent.  Abdominal:     Palpations:  Abdomen is soft. There is no hepatomegaly.  Skin:    General: Skin is warm and dry.  Neurological:     Mental Status: Alert and oriented to person, place and time.     Cranial Nerves: Cranial nerves are intact.     Medication Adjustments/Labs and Tests Ordered: Current medicines are reviewed at length with the patient today.  Concerns regarding medicines are outlined above.  Tests Ordered: Orders Placed This Encounter  Procedures   EKG 12-Lead   Medication Changes: Meds ordered this encounter  Medications   rosuvastatin (CRESTOR) 20 MG tablet    Sig: Take 1 tablet (20 mg total) by mouth at bedtime.    Dispense:  90 tablet    Refill:  3   Signed, Richardson Dopp, PA-C  12/14/2020 2:21 PM    [facility] [address], [place], [place]  [address] Phone: [phone]; Fax: [phone]

## 2020-12-14 NOTE — Patient Instructions (Signed)
Medication Instructions:   Your physician recommends that you continue on your current medications as directed. Please refer to the Current Medication list given to you today.  *If you need a refill on your cardiac medications before your next appointment, please call your pharmacy*   Lab Work:  -None  If you have labs (blood work) drawn today and your tests are completely normal, you will receive your results only by: [place] (if you have MyChart) OR A paper copy in the mail If you have any lab test that is abnormal or we need to change your treatment, we will call you to review the results.   Testing/Procedures:  -None   Follow-Up: At [facility], you and your health needs are our priority.  As part of our continuing mission to provide you with exceptional heart care, we have created designated Provider Care Teams.  These Care Teams include your primary Cardiologist (physician) and Advanced Practice Providers (APPs -  Physician Assistants and Nurse Practitioners) who all work together to provide you with the care you need, when you need it.  We recommend signing up for the patient portal called "MyChart".  Sign up information is provided on this After Visit Summary.  MyChart is used to connect with patients for Virtual Visits (Telemedicine).  Patients are able to view lab/test results, encounter notes, upcoming appointments, etc.  Non-urgent messages can be sent to your provider as well.   To learn more about what you can do with MyChart, go to [email or web address].    Your next appointment:   1 year(s)  The format for your next appointment:   In Person  Provider:   Lauree Chandler, MD   Other Instructions  Your physician wants you to follow-up in: 1 year with Dr.McAlhany. You will receive a reminder letter in the mail two months in advance. If you don't receive a letter, please call our office to schedule the follow-up appointment.

## 2020-12-14 NOTE — Assessment & Plan Note (Addendum)
Cardiac catheterization in 2017 with normal coronary arteries.  She continues to do well on a combination of amlodipine 5 mg daily and isosorbide mononitrate 15 mg daily.  She is also continued to feel much better since she started her exercise regimen.  She exercises twice a day on a daily basis.  Continue current therapy.  Follow-up in 1 year.

## 2020-12-14 NOTE — Assessment & Plan Note (Signed)
Recent LDL 123.  She was placed back on rosuvastatin by primary care.

## 2020-12-28 ENCOUNTER — Other Ambulatory Visit: Payer: Self-pay | Admitting: Cardiovascular Disease

## 2020-12-28 ENCOUNTER — Other Ambulatory Visit (HOSPITAL_COMMUNITY): Payer: Self-pay

## 2020-12-28 MED ORDER — AMLODIPINE BESYLATE 5 MG PO TABS
5.0000 mg | ORAL_TABLET | Freq: Every day | ORAL | 3 refills | Status: DC
  Filled 2020-12-28: qty 90, 90d supply, fill #0
  Filled 2021-03-28: qty 90, 90d supply, fill #1
  Filled 2021-06-27: qty 90, 90d supply, fill #2
  Filled 2021-09-26: qty 90, 90d supply, fill #3

## 2021-01-07 ENCOUNTER — Other Ambulatory Visit (HOSPITAL_COMMUNITY): Payer: Self-pay

## 2021-01-07 ENCOUNTER — Other Ambulatory Visit: Payer: Self-pay | Admitting: Cardiovascular Disease

## 2021-01-07 MED ORDER — ISOSORBIDE MONONITRATE ER 30 MG PO TB24
15.0000 mg | ORAL_TABLET | Freq: Every day | ORAL | 3 refills | Status: DC
  Filled 2021-01-07: qty 45, 90d supply, fill #0
  Filled 2021-04-11: qty 45, 90d supply, fill #1

## 2021-03-15 ENCOUNTER — Other Ambulatory Visit (HOSPITAL_COMMUNITY): Payer: Self-pay

## 2021-03-29 ENCOUNTER — Other Ambulatory Visit (HOSPITAL_COMMUNITY): Payer: Self-pay

## 2021-04-11 ENCOUNTER — Other Ambulatory Visit (HOSPITAL_COMMUNITY): Payer: Self-pay

## 2021-06-14 ENCOUNTER — Other Ambulatory Visit (HOSPITAL_COMMUNITY): Payer: Self-pay

## 2021-06-28 ENCOUNTER — Other Ambulatory Visit (HOSPITAL_COMMUNITY): Payer: Self-pay

## 2021-07-04 ENCOUNTER — Telehealth: Payer: Self-pay | Admitting: Cardiovascular Disease

## 2021-07-04 ENCOUNTER — Other Ambulatory Visit (HOSPITAL_COMMUNITY): Payer: Self-pay

## 2021-07-04 ENCOUNTER — Other Ambulatory Visit: Payer: Self-pay

## 2021-07-04 MED ORDER — NITROGLYCERIN 0.4 MG SL SUBL
0.4000 mg | SUBLINGUAL_TABLET | SUBLINGUAL | 5 refills | Status: DC | PRN
  Filled 2021-07-04: qty 25, 10d supply, fill #0

## 2021-07-04 MED ORDER — ISOSORBIDE MONONITRATE ER 30 MG PO TB24
30.0000 mg | ORAL_TABLET | Freq: Every day | ORAL | 3 refills | Status: DC
  Filled 2021-07-04: qty 90, 90d supply, fill #0
  Filled 2021-10-10: qty 54, 54d supply, fill #1
  Filled 2021-10-10: qty 36, 36d supply, fill #1

## 2021-07-04 NOTE — Telephone Encounter (Signed)
Yesterday for about 45 min had angina.  Tried tums but pain worsened. ?Pain in chest severe.  None in arm.  First episode in about 5 years. ?Has kept weight down and exercises in evenings. ? ?Took 2 ntg and chest pain subsided. Sweating, choking sensation.  She was very fatigued.  Napped yesterday and slept 10 hrs overnight and fatigued today.  Has headache that she is alternating tylenol/ibuprofen.  Just a little discomfort in chest today.  These symptoms are typical of episodes she has had in the past.  She does not remember headaches being part of the episodes.   ? ?She continues isosorbide 15 mg daily and amlodipine 5 mg daily.  BP elevated yesterday 169/84, 154/69. ?Today: 147/82.    This is still slightly elevated for her.   ? ?She is laying around today.  She is feeling weak.  This is not her usual activity level.   ? ?Pt aware I will send the message to Dr. Angelena Form and will call her with any recommendations.  ?

## 2021-07-04 NOTE — Telephone Encounter (Signed)
She has chronic microvascular angina. She has no CAD by cath in 2017. Continue Norvasc and Imdur. We can bump her Imdur up to 30 mg per day if she would like to try that. Gerald Stabs  ?  ? ? ?Spoke w patient.  She is in agreement to increase isosorbide to 30 mg daily.  Will start w this evening's dose.  ?Refill sent.  Pt feeling reassured and is aware to call back if any other concerns.   ?

## 2021-07-04 NOTE — Telephone Encounter (Signed)
Pt c/o of Chest Pain: STAT if CP now or developed within 24 hours ? ?1. Are you having CP right now? NO ? ?2. Are you experiencing any other symptoms (ex. SOB, nausea, vomiting, sweating)? Yesterday she was sweating and nausea, and had a headache.   Today she is just very tired.  ? ?3. How long have you been experiencing CP? Had a episode of angina yesterday.  ? ?4. Is your CP continuous or coming and going? Is not having any angina today.  ? ?5. Have you taken Nitroglycerin? Took two yesterday.  ? ? ? ?Pt c/o BP issue: STAT if pt c/o blurred vision, one-sided weakness or slurred speech ? ?1. What are your last 5 BP readings?  ?169/84 ?154/69 ?147/82 ? ? ?2. Are you having any other symptoms (ex. Dizziness, headache, blurred vision, passed out)? headache ? ?3. What is your BP issue? Elevated BP.  She states her BP was normal last night. The readings above are from today.  ? ? ??  ?

## 2021-07-04 NOTE — Telephone Encounter (Signed)
Pt's medication was sent to pt's pharmacy as requested. Confirmation received.  °

## 2021-07-27 ENCOUNTER — Other Ambulatory Visit (HOSPITAL_COMMUNITY): Payer: Self-pay

## 2021-09-08 ENCOUNTER — Other Ambulatory Visit (HOSPITAL_COMMUNITY): Payer: Self-pay

## 2021-09-15 ENCOUNTER — Other Ambulatory Visit: Payer: Self-pay | Admitting: Family Medicine

## 2021-09-15 DIAGNOSIS — Z1231 Encounter for screening mammogram for malignant neoplasm of breast: Secondary | ICD-10-CM

## 2021-09-26 ENCOUNTER — Other Ambulatory Visit (HOSPITAL_COMMUNITY): Payer: Self-pay

## 2021-10-10 ENCOUNTER — Other Ambulatory Visit (HOSPITAL_COMMUNITY): Payer: Self-pay

## 2021-11-01 ENCOUNTER — Ambulatory Visit: Payer: Medicare Other

## 2021-11-22 ENCOUNTER — Ambulatory Visit
Admission: RE | Admit: 2021-11-22 | Discharge: 2021-11-22 | Disposition: A | Discharge status: Home / Self Care | Payer: Medicare Other | Source: Ambulatory Visit | Attending: Family Medicine | Admitting: Family Medicine

## 2021-11-22 DIAGNOSIS — Z1231 Encounter for screening mammogram for malignant neoplasm of breast: Secondary | ICD-10-CM

## 2021-12-18 ENCOUNTER — Other Ambulatory Visit: Payer: Self-pay | Admitting: Cardiovascular Disease

## 2021-12-18 ENCOUNTER — Other Ambulatory Visit: Payer: Self-pay | Admitting: Physician Assistant

## 2021-12-20 ENCOUNTER — Other Ambulatory Visit (HOSPITAL_COMMUNITY): Payer: Self-pay

## 2021-12-20 MED ORDER — ROSUVASTATIN CALCIUM 20 MG PO TABS
20.0000 mg | ORAL_TABLET | Freq: Every day | ORAL | 0 refills | Status: DC
  Filled 2021-12-20: qty 30, 30d supply, fill #0

## 2021-12-20 NOTE — Progress Notes (Unsigned)
No chief complaint on file.  History of Present Illness: 71 yo female with history of microvascular angina, HTN and HLD who is here today for cardiac follow up. She has been followed in the past in our office by Dr. Aundra Dubin. She is suspected to have microvascular angina with chest pain occurring when walking up hills. Cardiac cath May 2017 with no evidence of CAD. Normal LV systolic function by cath. She was been on Norvasc and Imdur and has done well. Toprol stopped in November 2020 due to bradycardia.   She is here today for follow up. The patient denies any chest pain, dyspnea, palpitations, lower extremity edema, orthopnea, PND, dizziness, near syncope or syncope.   Primary Care Physician: Chesley Noon, MD  Past Medical History:  Diagnosis Date   Basal cell carcinoma    Hx of cardiac cath    a.  LHC (3/02):  Normal coronary arteries.  Left ventricular function was normal  //  b. LHC 06/29/15 - normal coronary arteries, EF 60-65%   Hx of cardiovascular stress test    a. 7/10 - normal, EF 72% // b. Myoview 4/14 - low risk, apical thinning, no ischemia, EF 80% //  c. ETT-Myoview (11/15):  Normal stress nuclear study. No ischemia.  LV Ejection Fraction: 75%   Hypercholesteremia    Hypertension    Microvascular angina ([place])    a. possibly - normal cath 2017.   Muscle cramp    Pulmonary nodule    a. by CT 11/2017 with recommendation for f/u 6-12 mo   Sinus problem    Skin cancer    basal cell, squamous cell   Syncope 2008   syncopal episode, probably related to excessive stress, activity, and fatigue        Past Surgical History:  Procedure Laterality Date   ABDOMINAL HYSTERECTOMY     BASAL CELL CARCINOMA EXCISION     CARDIAC CATHETERIZATION  2002   normal   CARDIAC CATHETERIZATION     CARDIAC CATHETERIZATION N/A 06/29/2015   Procedure: Left Heart Cath and Coronary Angiography;  Surgeon: Larey Dresser, MD;  Location: [facility];  Service: Cardiovascular;   Laterality: N/A;   CESAREAN SECTION     CHOLECYSTECTOMY     SQUAMOUS CELL CARCINOMA EXCISION      Current Outpatient Medications  Medication Sig Dispense Refill   amLODipine (NORVASC) 5 MG tablet Take 1 tablet (5 mg total) by mouth daily. Please make an appt for future refills, thank you 90 tablet 3   aspirin EC 81 MG tablet Take 81 mg by mouth at bedtime.      BIOTIN PO Take 5,000 mg by mouth daily.     isosorbide mononitrate (IMDUR) 30 MG 24 hr tablet Take 1 tablet (30 mg total) by mouth daily. 90 tablet 3   MAGNESIUM PO Take 200 mg by mouth daily.      meclizine (ANTIVERT) 12.5 MG tablet Take 1 tablet (12.5 mg total) by mouth 3 (three) times daily as needed for dizziness. 30 tablet 0   Multiple Vitamins-Minerals (CENTRUM SILVER PO) Take 1 tablet by mouth daily.     nitroGLYCERIN (NITROSTAT) 0.4 MG SL tablet Place 1 tablet (0.4 mg total) under the tongue every 5 (five) minutes as needed for chest pain. 25 tablet 5   Potassium 99 MG TABS Take 2 tablets by mouth daily.      rosuvastatin (CRESTOR) 20 MG tablet Take 1 tablet (20 mg total) by mouth at bedtime.  30 tablet 0   No current facility-administered medications for this visit.    Allergies  Allergen Reactions   Adhesive [Tape] Itching and Rash    Redness, Please use "paper" tape    Aspirin Diarrhea and Nausea Only    Can tolerate low dose coated aspirin   Lactose Intolerance (Gi) Diarrhea   Potassium-Containing Compounds Other (See Comments)    Indigestion    Social History   Socioeconomic History   Marital status: Married    Spouse name: Not on file   Number of children: 3   Years of education: one year college   Highest education level: Not on file  Occupational History   Occupation: Network engineer at [employer]  Tobacco Use   Smoking status: Never   Smokeless tobacco: Never  Vaping Use   Vaping Use: Never used  Substance and Sexual Activity   Alcohol use: No   Drug use: No   Sexual activity: Not on file  Other  Topics Concern   Not on file  Social History Narrative   Lives at home with husband.   Right-handed.   1 cup caffeine per day.   Social Determinants of Health   Financial Resource Strain: Not on file  Food Insecurity: Not on file  Transportation Needs: Not on file  Physical Activity: Not on file  Stress: Not on file  Social Connections: Not on file  Intimate Partner Violence: Not on file    Family History  Problem Relation Age of Onset   Hypertension Father    Heart attack Father 50   Heart attack Mother        questionable   Lung cancer Mother    Coronary artery disease Other        strong family history    Stroke Maternal Grandfather    Diabetes Maternal Grandfather     Review of Systems:  As stated in the HPI and otherwise negative.   There were no vitals taken for this visit.  Physical Examination: General: Well developed, well nourished, NAD  HEENT: OP clear, mucus membranes moist  SKIN: warm, dry. No rashes. Neuro: No focal deficits  Musculoskeletal: Muscle strength 5/5 all ext  Psychiatric: Mood and affect normal  Neck: No JVD, no carotid bruits, no thyromegaly, no lymphadenopathy.  Lungs:Clear bilaterally, no wheezes, rhonci, crackles Cardiovascular: Regular rate and rhythm. No murmurs, gallops or rubs. Abdomen:Soft. Bowel sounds present. Non-tender.  Extremities: No lower extremity edema. Pulses are 2 + in the bilateral DP/PT.  EKG:  EKG is *** ordered today. The ekg ordered today demonstrates   Echo January 2020: - Left ventricle: The cavity size was normal. Wall thickness was    normal. Systolic function was vigorous. The estimated ejection    fraction was in the range of 65% to 70%. Wall motion was normal;    there were no regional wall motion abnormalities. Doppler    parameters are consistent with abnormal left ventricular    relaxation (grade 1 diastolic dysfunction).  - Mitral valve: There was trivial regurgitation.   Recent Labs: No  results found for requested labs within last 365 days.   Lipid Panel    Component Value Date/Time   CHOL 143 01/24/2017 0856   TRIG 107 01/24/2017 0856   HDL 57 01/24/2017 0856   CHOLHDL 2.5 01/24/2017 0856   CHOLHDL 3 10/07/2014 1055   VLDL 25.8 10/07/2014 1055   LDLCALC 65 01/24/2017 0856   LDLDIRECT 102.3 10/17/2011 0941     Wt Readings from  Last 3 Encounters:  12/14/20 148 lb (67.1 kg)  08/20/19 144 lb 9.6 oz (65.6 kg)  08/05/18 157 lb (71.2 kg)    Assessment and Plan:   1. Microvascular angina: No chest pain. Will continue Imdur and Norvasc. Toprol stopped in November 2020 due to bradycardia.   2. Hyperlipidemia: She has not tolerated statins due to muscle aches Labs/ tests ordered today include:   No orders of the defined types were placed in this encounter.  Disposition:   F/U with me in 12 months  Signed, Lauree Chandler, MD 12/20/2021 4:03 PM    [facility] [address] [place], [place], [place]  [address] Phone: [phone]; Fax: [phone]

## 2021-12-21 ENCOUNTER — Encounter: Payer: Self-pay | Admitting: Cardiovascular Disease

## 2021-12-21 ENCOUNTER — Other Ambulatory Visit (HOSPITAL_COMMUNITY): Payer: Self-pay

## 2021-12-21 ENCOUNTER — Ambulatory Visit: Payer: Medicare Other | Attending: Cardiovascular Disease | Admitting: Cardiovascular Disease

## 2021-12-21 VITALS — BP 148/80 | HR 67 | Ht 64.5 in | Wt 152.0 lb

## 2021-12-21 DIAGNOSIS — I2089 Other forms of angina pectoris: Secondary | ICD-10-CM | POA: Diagnosis not present

## 2021-12-21 DIAGNOSIS — I10 Essential (primary) hypertension: Secondary | ICD-10-CM

## 2021-12-21 MED ORDER — AMLODIPINE BESYLATE 5 MG PO TABS
5.0000 mg | ORAL_TABLET | Freq: Every day | ORAL | 3 refills | Status: DC
  Filled 2021-12-21: qty 90, 90d supply, fill #0
  Filled 2022-03-20: qty 90, 90d supply, fill #1
  Filled 2022-06-24: qty 90, 90d supply, fill #2
  Filled 2022-09-17: qty 90, 90d supply, fill #3

## 2021-12-21 MED ORDER — ROSUVASTATIN CALCIUM 20 MG PO TABS
20.0000 mg | ORAL_TABLET | Freq: Every day | ORAL | 3 refills | Status: DC
  Filled 2021-12-21: qty 90, 90d supply, fill #0
  Filled 2022-03-17: qty 90, 90d supply, fill #1
  Filled 2022-06-16: qty 90, 90d supply, fill #2
  Filled 2022-09-17: qty 90, 90d supply, fill #3

## 2021-12-21 MED ORDER — ISOSORBIDE MONONITRATE ER 30 MG PO TB24
30.0000 mg | ORAL_TABLET | Freq: Every day | ORAL | 3 refills | Status: DC
  Filled 2021-12-21 (×2): qty 90, 90d supply, fill #0
  Filled 2022-04-04: qty 90, 90d supply, fill #1
  Filled 2022-07-03: qty 90, 90d supply, fill #2
  Filled 2022-10-06: qty 90, 90d supply, fill #3

## 2021-12-21 NOTE — Patient Instructions (Signed)
Medication Instructions:  No changes *If you need a refill on your cardiac medications before your next appointment, please call your pharmacy*   Lab Work: none  Testing/Procedures: none   Follow-Up: At [facility], you and your health needs are our priority.  As part of our continuing mission to provide you with exceptional heart care, we have created designated Provider Care Teams.  These Care Teams include your primary Cardiologist (physician) and Advanced Practice Providers (APPs -  Physician Assistants and Nurse Practitioners) who all work together to provide you with the care you need, when you need it.   Your next appointment:   12 month(s)  The format for your next appointment:   In Person  Provider:   Christopher McAlhany, MD   Important Information About Sugar       

## 2022-03-17 ENCOUNTER — Other Ambulatory Visit (HOSPITAL_COMMUNITY): Payer: Self-pay

## 2022-03-21 ENCOUNTER — Other Ambulatory Visit: Payer: Self-pay

## 2022-04-05 ENCOUNTER — Other Ambulatory Visit (HOSPITAL_COMMUNITY): Payer: Self-pay

## 2022-12-15 ENCOUNTER — Other Ambulatory Visit: Payer: Self-pay | Admitting: Cardiovascular Disease

## 2022-12-15 ENCOUNTER — Other Ambulatory Visit (HOSPITAL_COMMUNITY): Payer: Self-pay

## 2022-12-15 MED ORDER — ROSUVASTATIN CALCIUM 20 MG PO TABS
20.0000 mg | ORAL_TABLET | Freq: Every day | ORAL | 0 refills | Status: DC
  Filled 2022-12-15: qty 30, 30d supply, fill #0

## 2022-12-18 ENCOUNTER — Other Ambulatory Visit: Payer: Self-pay | Admitting: Cardiovascular Disease

## 2022-12-19 ENCOUNTER — Other Ambulatory Visit (HOSPITAL_COMMUNITY): Payer: Self-pay

## 2022-12-20 ENCOUNTER — Other Ambulatory Visit (HOSPITAL_COMMUNITY): Payer: Self-pay

## 2022-12-20 MED ORDER — AMLODIPINE BESYLATE 5 MG PO TABS
5.0000 mg | ORAL_TABLET | Freq: Every day | ORAL | 0 refills | Status: DC
  Filled 2022-12-20: qty 30, 30d supply, fill #0

## 2023-01-02 ENCOUNTER — Other Ambulatory Visit: Payer: Self-pay | Admitting: Cardiovascular Disease

## 2023-01-03 ENCOUNTER — Other Ambulatory Visit (HOSPITAL_COMMUNITY): Payer: Self-pay

## 2023-01-03 MED ORDER — ISOSORBIDE MONONITRATE ER 30 MG PO TB24
30.0000 mg | ORAL_TABLET | Freq: Every day | ORAL | 0 refills | Status: DC
  Filled 2023-01-03: qty 90, 90d supply, fill #0

## 2023-01-15 ENCOUNTER — Other Ambulatory Visit: Payer: Self-pay | Admitting: Cardiovascular Disease

## 2023-01-15 NOTE — Telephone Encounter (Signed)
Patient has an appt tomorrow - Will wait to refill until her appt time just incase of any changes.

## 2023-01-15 NOTE — Progress Notes (Unsigned)
Cardiology Office Note:    Date:  01/16/2023  ID:  [name], DOB 1950/05/03, MRN [id number] PCP: Eartha Inch, MD  [facility] Providers Cardiologist:  Verne Carrow, MD       Patient Profile:      Microvascular angina Chronic chest pain-associated with left arm pain and dyspnea Intolerant of nitrates in past >> able to tol now Metoprolol DC'd due to brady Ranolazine too expensive Rx: Amlodipine LHC 06/29/2015: No CAD TTE 02/26/2018: EF 65-70, normal wall motion, GR 1 DD, trivial MR  Hypertension Hyperlipidemia          History of Present Illness:  Discussed the use of AI scribe software for clinical note transcription with the patient, who gave verbal consent to proceed.  [name] is a 72 y.o. female who returns for follow-up of microvascular angina.  She was last seen by Dr. Clifton James in November 2023.  She is here alone. The patient has been asymptomatic with no complaints of chest pain, pressure, tightness, shortness of breath, dizziness, or swelling in the legs. There have been no episodes of passing out.  She has noted elevated blood pressure readings at home.  She also notes weight gain over the past year.  The patient is looking forward to retiring at the end of January and plans to continue volunteering at her church and traveling to see family.      Review of Systems  Gastrointestinal:  Negative for hematochezia and melena.  Genitourinary:  Negative for hematuria.  See HPI     Studies Reviewed:   EKG Interpretation Date/Time:  Tuesday January 16 2023 09:38:13 EST Ventricular Rate:  75 PR Interval:  156 QRS Duration:  80 QT Interval:  370 QTC Calculation: 413 R Axis:   32  Text Interpretation: Normal sinus rhythm Normal ECG No significant change since last tracing Confirmed by Tereso Newcomer [phone]) on 01/16/2023 10:14:02 AM    Results   LABS LDL: 80 (12/2021)      Risk Assessment/Calculations:     HYPERTENSION  CONTROL Vitals:   01/16/23 0932 01/16/23 1006  BP: (!) 143/82 (!) 150/80    The patient's blood pressure is elevated above target today.  In order to address the patient's elevated BP: A current anti-hypertensive medication was adjusted today.          Physical Exam:   VS:  BP (!) 150/80   Pulse 75   Ht 5\' 4"  (1.626 m)   Wt 157 lb 12.8 oz (71.6 kg)   SpO2 96%   BMI 27.09 kg/m    Wt Readings from Last 3 Encounters:  01/16/23 157 lb 12.8 oz (71.6 kg)  12/21/21 152 lb (68.9 kg)  12/14/20 148 lb (67.1 kg)    Constitutional:      Appearance: Healthy appearance. Not in distress.  Neck:     Vascular: No carotid bruit. JVD normal.  Pulmonary:     Breath sounds: Normal breath sounds. No wheezing. No rales.  Cardiovascular:     Normal rate. Regular rhythm.     Murmurs: There is no murmur.  Edema:    Peripheral edema absent.  Abdominal:     Palpations: Abdomen is soft.        Assessment and Plan:   Assessment & Plan Microvascular angina [facility]) She has a history of chest pain with associated left arm pain and dyspnea related to microvascular angina.  She had no CAD at cardiac catheterization in 2017. Echocardiogram in January 2020  showed EF 65-70%. Currently asymptomatic, managed with long-acting nitrates and amlodipine.  - Continue amlodipine - Continue isosorbide mononitrate 30 mg daily - Continue nitroglycerin PRN - Follow-up in one year Essential hypertension Blood pressure above goal.  She plans to work on diet and exercise to lose weight. - Increase amlodipine to 10 mg daily - Continue isosorbide mononitrate 30 mg daily - Monitor blood pressure at home and report if readings remain greater than or equal to 140/90 mmHg - Obtain comprehensive metabolic panel (CMP) Mixed hyperlipidemia Labs from primary care reviewed through care everywhere.  LDL cholesterol optimal at 80 mg/dL in November 0102. Currently on Crestor 20 mg daily. - Continue Crestor 20 mg daily - Obtain  fasting lipid panel, CMET today.         Dispo:  Return in about 1 year (around 01/16/2024) for Routine Follow Up, w/ Dr. Clifton James, or Tereso Newcomer, PA-C.  Signed, Tereso Newcomer, PA-C

## 2023-01-16 ENCOUNTER — Other Ambulatory Visit (HOSPITAL_COMMUNITY): Payer: Self-pay

## 2023-01-16 ENCOUNTER — Encounter: Payer: Self-pay | Admitting: Physician Assistant

## 2023-01-16 ENCOUNTER — Ambulatory Visit: Payer: Medicare Other | Attending: Physician Assistant | Admitting: Physician Assistant

## 2023-01-16 VITALS — BP 150/80 | HR 75 | Ht 64.0 in | Wt 157.8 lb

## 2023-01-16 DIAGNOSIS — I2089 Other forms of angina pectoris: Secondary | ICD-10-CM

## 2023-01-16 DIAGNOSIS — E782 Mixed hyperlipidemia: Secondary | ICD-10-CM

## 2023-01-16 DIAGNOSIS — I10 Essential (primary) hypertension: Secondary | ICD-10-CM | POA: Diagnosis not present

## 2023-01-16 LAB — COMPREHENSIVE METABOLIC PANEL
ALT: 22 [IU]/L (ref 0–32)
AST: 30 [IU]/L (ref 0–40)
Albumin: 4.4 g/dL (ref 3.8–4.8)
Alkaline Phosphatase: 56 [IU]/L (ref 44–121)
BUN/Creatinine Ratio: 18 (ref 12–28)
BUN: 15 mg/dL (ref 8–27)
Bilirubin Total: 0.4 mg/dL (ref 0.0–1.2)
CO2: 26 mmol/L (ref 20–29)
Calcium: 9.8 mg/dL (ref 8.7–10.3)
Chloride: 104 mmol/L (ref 96–106)
Creatinine, Ser: 0.85 mg/dL (ref 0.57–1.00)
Globulin, Total: 2.4 g/dL (ref 1.5–4.5)
Glucose: 89 mg/dL (ref 70–99)
Potassium: 4.5 mmol/L (ref 3.5–5.2)
Sodium: 141 mmol/L (ref 134–144)
Total Protein: 6.8 g/dL (ref 6.0–8.5)
eGFR: 73 mL/min/{1.73_m2} (ref >59)

## 2023-01-16 LAB — LIPID PANEL
Chol/HDL Ratio: 2.5 {ratio} (ref 0.0–4.4)
Cholesterol, Total: 173 mg/dL (ref 100–199)
HDL: 69 mg/dL (ref >39)
LDL Chol Calc (NIH): 77 mg/dL (ref 0–99)
Triglycerides: 158 mg/dL — ABNORMAL HIGH (ref 0–149)
VLDL Cholesterol Cal: 27 mg/dL (ref 5–40)

## 2023-01-16 MED ORDER — ISOSORBIDE MONONITRATE ER 30 MG PO TB24: 30.0000 mg | ORAL_TABLET | Freq: Every day | ORAL | 3 refills | Status: DC

## 2023-01-16 MED ORDER — NITROGLYCERIN 0.4 MG SL SUBL: 0.4000 mg | SUBLINGUAL_TABLET | SUBLINGUAL | 5 refills | Status: DC | PRN

## 2023-01-16 MED ORDER — AMLODIPINE BESYLATE 10 MG PO TABS
10.0000 mg | ORAL_TABLET | Freq: Every day | ORAL | 3 refills | Status: DC
  Filled 2023-01-16: qty 90, 90d supply, fill #0
  Filled 2023-04-11 – 2023-04-12 (×2): qty 90, 90d supply, fill #1

## 2023-01-16 MED ORDER — ROSUVASTATIN CALCIUM 20 MG PO TABS: 20.0000 mg | ORAL_TABLET | Freq: Every day | ORAL | 3 refills | Status: DC

## 2023-01-16 NOTE — Assessment & Plan Note (Signed)
Blood pressure above goal.  She plans to work on diet and exercise to lose weight. - Increase amlodipine to 10 mg daily - Continue isosorbide mononitrate 30 mg daily - Monitor blood pressure at home and report if readings remain greater than or equal to 140/90 mmHg - Obtain comprehensive metabolic panel (CMP)

## 2023-01-16 NOTE — Addendum Note (Signed)
Addended by: Burnetta Sabin on: 01/16/2023 11:18 AM   Modules accepted: Orders

## 2023-01-16 NOTE — Assessment & Plan Note (Signed)
She has a history of chest pain with associated left arm pain and dyspnea related to microvascular angina.  She had no CAD at cardiac catheterization in 2017. Echocardiogram in January 2020 showed EF 65-70%. Currently asymptomatic, managed with long-acting nitrates and amlodipine.  - Continue amlodipine - Continue isosorbide mononitrate 30 mg daily - Continue nitroglycerin PRN - Follow-up in one year

## 2023-01-16 NOTE — Patient Instructions (Signed)
Medication Instructions:  Your physician has recommended you make the following change in your medication:   INCREASE Amlodpine to 10 mg taking 1 daily *If you need a refill on your cardiac medications before your next appointment, please call your pharmacy*   Lab Work: TODAY:  LIPID & CMET  If you have labs (blood work) drawn today and your tests are completely normal, you will receive your results only by: MyChart Message (if you have MyChart) OR A paper copy in the mail If you have any lab test that is abnormal or we need to change your treatment, we will call you to review the results.   Testing/Procedures: None ordered   Follow-Up: At [facility], you and your health needs are our priority.  As part of our continuing mission to provide you with exceptional heart care, we have created designated Provider Care Teams.  These Care Teams include your primary Cardiologist (physician) and Advanced Practice Providers (APPs -  Physician Assistants and Nurse Practitioners) who all work together to provide you with the care you need, when you need it.  We recommend signing up for the patient portal called "MyChart".  Sign up information is provided on this After Visit Summary.  MyChart is used to connect with patients for Virtual Visits (Telemedicine).  Patients are able to view lab/test results, encounter notes, upcoming appointments, etc.  Non-urgent messages can be sent to your provider as well.   To learn more about what you can do with MyChart, go to [email or web address].    Your next appointment:   12 month(s)  Provider:   Verne Carrow, MD     Other Instructions Monitor your blood pressures at home.  If you have readings over 140/90 3 times in a row, call the office.  Your physician has requested that you regularly monitor and record your blood pressure readings at home. Please use the same machine at the same time of day to check your readings and record them  to bring to your follow-up visit.   Please monitor blood pressures and keep a log of your readings.    Make sure to check 2 hours after your medications.    AVOID these things for 30 minutes before checking your blood pressure: No Drinking caffeine. No Drinking alcohol. No Eating. No Smoking. No Exercising.   Five minutes before checking your blood pressure: Pee. Sit in a dining chair. Avoid sitting in a soft couch or armchair. Be quiet. Do not talk

## 2023-01-16 NOTE — Assessment & Plan Note (Signed)
Labs from primary care reviewed through care everywhere.  LDL cholesterol optimal at 80 mg/dL in November 9147. Currently on Crestor 20 mg daily. - Continue Crestor 20 mg daily - Obtain fasting lipid panel, CMET today.

## 2023-01-17 ENCOUNTER — Other Ambulatory Visit (HOSPITAL_COMMUNITY): Payer: Self-pay

## 2023-01-17 MED ORDER — AMLODIPINE BESYLATE 5 MG PO TABS
5.0000 mg | ORAL_TABLET | Freq: Every day | ORAL | 3 refills | Status: DC
  Filled 2023-01-17 – 2023-04-11 (×2): qty 90, 90d supply, fill #0

## 2023-01-22 ENCOUNTER — Other Ambulatory Visit: Payer: Self-pay

## 2023-01-22 ENCOUNTER — Other Ambulatory Visit (HOSPITAL_COMMUNITY): Payer: Self-pay

## 2023-01-22 ENCOUNTER — Telehealth: Payer: Self-pay | Admitting: Cardiovascular Disease

## 2023-01-22 ENCOUNTER — Other Ambulatory Visit: Payer: Self-pay | Admitting: Family Medicine

## 2023-01-22 ENCOUNTER — Encounter: Payer: Self-pay | Admitting: Family Medicine

## 2023-01-22 ENCOUNTER — Other Ambulatory Visit: Payer: Self-pay | Admitting: Cardiovascular Disease

## 2023-01-22 DIAGNOSIS — Z1231 Encounter for screening mammogram for malignant neoplasm of breast: Secondary | ICD-10-CM

## 2023-01-22 MED ORDER — NITROGLYCERIN 0.4 MG SL SUBL
0.4000 mg | SUBLINGUAL_TABLET | SUBLINGUAL | 3 refills | Status: AC | PRN
  Filled 2023-01-22: qty 75, 90d supply, fill #0

## 2023-01-22 NOTE — Telephone Encounter (Signed)
 RX sent to requested Pharmacy

## 2023-01-22 NOTE — Telephone Encounter (Signed)
*  STAT* If patient is at the pharmacy, call can be transferred to refill team.   1. Which medications need to be refilled? (please list name of each medication and dose if known) nitroGLYCERIN (NITROSTAT) 0.4 MG SL tablet    2. Would you like to learn more about the convenience, safety, & potential cost savings by using the [facility]?  Yes   3. Are you open to using the [facility] (Type [facility]. Yes ).   4. Which pharmacy/location (including street and city if local pharmacy) is medication to be sent to? [place] - [facility]    5. Do they need a 30 day or 90 day supply? 90

## 2023-01-24 ENCOUNTER — Other Ambulatory Visit: Payer: Self-pay | Admitting: Cardiovascular Disease

## 2023-01-24 ENCOUNTER — Other Ambulatory Visit (HOSPITAL_COMMUNITY): Payer: Self-pay

## 2023-01-25 ENCOUNTER — Telehealth: Payer: Self-pay | Admitting: Cardiovascular Disease

## 2023-01-25 ENCOUNTER — Other Ambulatory Visit (HOSPITAL_COMMUNITY): Payer: Self-pay

## 2023-01-25 ENCOUNTER — Other Ambulatory Visit: Payer: Self-pay

## 2023-01-25 MED ORDER — ROSUVASTATIN CALCIUM 20 MG PO TABS
20.0000 mg | ORAL_TABLET | Freq: Every day | ORAL | 3 refills | Status: DC
  Filled 2023-01-25: qty 90, 90d supply, fill #0
  Filled 2023-04-22: qty 90, 90d supply, fill #1
  Filled 2023-07-17: qty 90, 90d supply, fill #2
  Filled 2023-10-16: qty 90, 90d supply, fill #3

## 2023-01-25 NOTE — Telephone Encounter (Signed)
*  STAT* If patient is at the pharmacy, call can be transferred to refill team.   1. Which medications need to be refilled? (please list name of each medication and dose if known)   rosuvastatin (CRESTOR) 20 MG tablet    2. Which pharmacy/location (including street and city if local pharmacy) is medication to be sent to? [place] - [facility]   3. Do they need a 30 day or 90 day supply? 90

## 2023-01-25 NOTE — Telephone Encounter (Signed)
 RX sent to requested Pharmacy

## 2023-01-26 ENCOUNTER — Other Ambulatory Visit (HOSPITAL_COMMUNITY): Payer: Self-pay

## 2023-01-31 ENCOUNTER — Ambulatory Visit
Admission: RE | Admit: 2023-01-31 | Discharge: 2023-01-31 | Disposition: A | Discharge status: Home / Self Care | Payer: Medicare Other | Source: Ambulatory Visit | Attending: Family Medicine | Admitting: Family Medicine

## 2023-01-31 DIAGNOSIS — Z1231 Encounter for screening mammogram for malignant neoplasm of breast: Secondary | ICD-10-CM

## 2023-04-10 ENCOUNTER — Other Ambulatory Visit (HOSPITAL_COMMUNITY): Payer: Self-pay

## 2023-04-10 ENCOUNTER — Other Ambulatory Visit: Payer: Self-pay | Admitting: Cardiovascular Disease

## 2023-04-11 ENCOUNTER — Other Ambulatory Visit (HOSPITAL_COMMUNITY): Payer: Self-pay

## 2023-04-11 ENCOUNTER — Other Ambulatory Visit: Payer: Self-pay

## 2023-04-12 ENCOUNTER — Other Ambulatory Visit (HOSPITAL_COMMUNITY): Payer: Self-pay

## 2023-04-12 ENCOUNTER — Other Ambulatory Visit: Payer: Self-pay

## 2023-04-12 ENCOUNTER — Encounter (HOSPITAL_COMMUNITY): Payer: Self-pay

## 2023-04-12 ENCOUNTER — Telehealth: Payer: Self-pay | Admitting: Cardiovascular Disease

## 2023-04-12 MED ORDER — ISOSORBIDE MONONITRATE ER 30 MG PO TB24
30.0000 mg | ORAL_TABLET | Freq: Every day | ORAL | 2 refills | Status: DC
  Filled 2023-04-12: qty 90, 90d supply, fill #0
  Filled 2023-07-06: qty 90, 90d supply, fill #1

## 2023-04-12 NOTE — Telephone Encounter (Signed)
 Pt's medication was sent to pt's pharmacy as requested. Confirmation received.

## 2023-04-12 NOTE — Telephone Encounter (Signed)
*  STAT* If patient is at the pharmacy, call can be transferred to refill team.   1. Which medications need to be refilled? (please list name of each medication and dose if known)   isosorbide mononitrate (IMDUR) 30 MG 24 hr tablet    2. Which pharmacy/location (including street and city if local pharmacy) is medication to be sent to?  [place] - [facility]    3. Do they need a 30 day or 90 day supply? 90

## 2023-06-13 ENCOUNTER — Telehealth: Payer: Self-pay | Admitting: Cardiovascular Disease

## 2023-06-13 DIAGNOSIS — I10 Essential (primary) hypertension: Secondary | ICD-10-CM

## 2023-06-13 NOTE — Telephone Encounter (Signed)
 Spoke with patient and she states she swelling in her feet and ankles. She also have some swelling in her hands. Denies any chest pain  , slight SOB. She states she has been monitoring her salt intake and elevating her legs and feet as much as possible.    Advised she can also try compression stockings , continue to elevate legs and feet and monitoring salt intake.  She is taking amlodipine 

## 2023-06-13 NOTE — Telephone Encounter (Signed)
 Pt c/o swelling: STAT is pt has developed SOB within 24 hours  How much weight have you gained and in what time span? 10lbs over a couple of months   If swelling, where is the swelling located? Feet and hand are swollen  Are you currently taking a fluid pill? no  Are you currently SOB? Slightly   Do you have a log of your daily weights (if so, list)?    Have you gained 3 pounds in a day or 5 pounds in a week? No   Have you traveled recently? no

## 2023-06-14 ENCOUNTER — Other Ambulatory Visit (HOSPITAL_COMMUNITY): Payer: Self-pay

## 2023-06-14 ENCOUNTER — Other Ambulatory Visit: Payer: Self-pay

## 2023-06-14 MED ORDER — LOSARTAN POTASSIUM 25 MG PO TABS
25.0000 mg | ORAL_TABLET | Freq: Every day | ORAL | 3 refills | Status: DC
  Filled 2023-06-14: qty 90, 90d supply, fill #0

## 2023-06-14 NOTE — Telephone Encounter (Signed)
 I called and spoke with patient and she was driving. Per her request will send her a mychart message with new instructions

## 2023-06-25 NOTE — Telephone Encounter (Signed)
 Spoke with patient and she is aware she does not have an appointment with Geralyn Knee tomorrow but she is supposed to get lab work done tomorrow.

## 2023-06-25 NOTE — Telephone Encounter (Signed)
 Patient is calling stating that when she had spoken with a nurse last week, she was scheduled to see PA Marlyse Single on 06/26/23. Patient was wanting to know verify is she needs to be seen in office or come in for lab work. I do not see any appointment scheduled for 06/26/23. Please advise.

## 2023-06-26 ENCOUNTER — Other Ambulatory Visit: Payer: Self-pay

## 2023-06-26 DIAGNOSIS — I10 Essential (primary) hypertension: Secondary | ICD-10-CM

## 2023-06-26 LAB — BASIC METABOLIC PANEL WITH GFR
BUN/Creatinine Ratio: 18 (ref 12–28)
BUN: 15 mg/dL (ref 8–27)
CO2: 22 mmol/L (ref 20–29)
Calcium: 9.7 mg/dL (ref 8.7–10.3)
Chloride: 105 mmol/L (ref 96–106)
Creatinine, Ser: 0.84 mg/dL (ref 0.57–1.00)
Glucose: 85 mg/dL (ref 70–99)
Potassium: 4.5 mmol/L (ref 3.5–5.2)
Sodium: 144 mmol/L (ref 134–144)
eGFR: 74 mL/min/{1.73_m2} (ref >59)

## 2023-06-27 ENCOUNTER — Encounter: Payer: Self-pay | Admitting: *Deleted

## 2023-07-17 ENCOUNTER — Other Ambulatory Visit (HOSPITAL_COMMUNITY): Payer: Self-pay

## 2023-07-17 ENCOUNTER — Other Ambulatory Visit: Payer: Self-pay

## 2023-07-17 ENCOUNTER — Telehealth: Payer: Self-pay | Admitting: Cardiovascular Disease

## 2023-07-17 DIAGNOSIS — I10 Essential (primary) hypertension: Secondary | ICD-10-CM

## 2023-07-17 MED ORDER — LOSARTAN POTASSIUM 50 MG PO TABS
50.0000 mg | ORAL_TABLET | Freq: Every day | ORAL | 3 refills | Status: AC
Start: 2023-07-17 — End: 2024-07-15
  Filled 2023-07-17: qty 90, 90d supply, fill #0
  Filled 2023-10-16: qty 90, 90d supply, fill #1
  Filled 2024-01-13: qty 90, 90d supply, fill #2

## 2023-07-17 MED ORDER — ISOSORBIDE MONONITRATE ER 60 MG PO TB24
60.0000 mg | ORAL_TABLET | Freq: Every day | ORAL | 3 refills | Status: AC
  Filled 2023-07-17: qty 90, 90d supply, fill #0
  Filled 2023-10-16: qty 90, 90d supply, fill #1
  Filled 2024-01-13: qty 90, 90d supply, fill #2

## 2023-07-17 MED ORDER — ESTRADIOL 10 MCG VA TABS
10.0000 ug | ORAL_TABLET | Freq: Every day | VAGINAL | 3 refills | Status: AC
  Filled 2023-07-17: qty 18, 28d supply, fill #0
  Filled 2023-07-17: qty 16, 21d supply, fill #0

## 2023-07-17 NOTE — Telephone Encounter (Signed)
 Increase Losartan  to 50 mg once daily - Rx sent to [facility] Increase Imdur  to 45 mg x 1 week, then 60 mg once daily - Rx sent to [facility] Send BP readings in 2 weeks.  BMET 1 month. Order placed in Epic. Marlyse Single, PA-C    07/17/2023 4:37 PM

## 2023-07-17 NOTE — Telephone Encounter (Signed)
 Pt c/o BP issue: STAT if pt c/o blurred vision, one-sided weakness or slurred speech.  STAT if BP is GREATER than 180/120 TODAY.  STAT if BP is LESS than 90/60 and SYMPTOMATIC TODAY  1. What is your BP concern? Running high  2. Have you taken any BP medication today? takes at night   3. What are your last 5 BP readings?137/76, 151/85, 142/78,180/87, 174/90, 167/86  4. Are you having any other symptoms (ex. Dizziness, headache, blurred vision, passed out)? Headaches,angina

## 2023-07-17 NOTE — Telephone Encounter (Signed)
Spoke with pt, aware of the recommendations.  

## 2023-07-17 NOTE — Telephone Encounter (Signed)
 Spoke with pt, she reports the amlodipine  was stopped due to swelling and since that time her blood pressure is trending high. She denies any other problems. Aware will forward to [facility] for review.

## 2023-08-06 LAB — COLOGUARD: COLOGUARD: NEGATIVE

## 2023-08-14 ENCOUNTER — Other Ambulatory Visit (HOSPITAL_BASED_OUTPATIENT_CLINIC_OR_DEPARTMENT_OTHER): Payer: Self-pay | Admitting: Obstetrics and Gynecology

## 2023-08-14 DIAGNOSIS — M8580 Other specified disorders of bone density and structure, unspecified site: Secondary | ICD-10-CM

## 2023-08-28 ENCOUNTER — Emergency Department (HOSPITAL_BASED_OUTPATIENT_CLINIC_OR_DEPARTMENT_OTHER)
Admission: EM | Admit: 2023-08-28 | Discharge: 2023-08-29 | Disposition: A | Discharge status: Home / Self Care | Attending: Emergency Medicine | Admitting: Emergency Medicine

## 2023-08-28 ENCOUNTER — Encounter (HOSPITAL_BASED_OUTPATIENT_CLINIC_OR_DEPARTMENT_OTHER): Payer: Self-pay

## 2023-08-28 ENCOUNTER — Other Ambulatory Visit: Payer: Self-pay

## 2023-08-28 ENCOUNTER — Emergency Department (HOSPITAL_BASED_OUTPATIENT_CLINIC_OR_DEPARTMENT_OTHER): Admitting: Radiology

## 2023-08-28 DIAGNOSIS — Z79899 Other long term (current) drug therapy: Secondary | ICD-10-CM | POA: Insufficient documentation

## 2023-08-28 DIAGNOSIS — Z7982 Long term (current) use of aspirin: Secondary | ICD-10-CM | POA: Insufficient documentation

## 2023-08-28 DIAGNOSIS — I10 Essential (primary) hypertension: Secondary | ICD-10-CM | POA: Insufficient documentation

## 2023-08-28 DIAGNOSIS — Z85828 Personal history of other malignant neoplasm of skin: Secondary | ICD-10-CM | POA: Insufficient documentation

## 2023-08-28 DIAGNOSIS — R072 Precordial pain: Secondary | ICD-10-CM

## 2023-08-28 DIAGNOSIS — I700 Atherosclerosis of aorta: Secondary | ICD-10-CM | POA: Diagnosis not present

## 2023-08-28 DIAGNOSIS — R911 Solitary pulmonary nodule: Secondary | ICD-10-CM | POA: Diagnosis not present

## 2023-08-28 DIAGNOSIS — R079 Chest pain, unspecified: Secondary | ICD-10-CM | POA: Diagnosis present

## 2023-08-28 DIAGNOSIS — I2510 Atherosclerotic heart disease of native coronary artery without angina pectoris: Secondary | ICD-10-CM | POA: Insufficient documentation

## 2023-08-28 DIAGNOSIS — K5730 Diverticulosis of large intestine without perforation or abscess without bleeding: Secondary | ICD-10-CM | POA: Insufficient documentation

## 2023-08-28 NOTE — ED Triage Notes (Addendum)
 Pt POV with husband  d/t has a sharp pain in center chest while sitting - onset 2215 for 30 minutes. Pt took 3 Nitroglycerins and said it did not work however pt now does nothave pain in chest - only in left scapula.  2/10.  After she had CP she had left neck and left shoulder.  She said she could not even talk during the CP.  Pt also stated she has not felt well since Saturday (lethargic which is not normal for me) Pt states she has had these episodes before but not since being on Isosorbide . Pt did have to increase it at one point.

## 2023-08-29 ENCOUNTER — Emergency Department (HOSPITAL_BASED_OUTPATIENT_CLINIC_OR_DEPARTMENT_OTHER)

## 2023-08-29 LAB — RESP PANEL BY RT-PCR (RSV, FLU A&B, COVID)  RVPGX2
Influenza A by PCR: NEGATIVE
Influenza B by PCR: NEGATIVE
Resp Syncytial Virus by PCR: NEGATIVE
SARS Coronavirus 2 by RT PCR: NEGATIVE

## 2023-08-29 LAB — CBC
HCT: 38.8 % (ref 36.0–46.0)
Hemoglobin: 13 g/dL (ref 12.0–15.0)
MCH: 29.4 pg (ref 26.0–34.0)
MCHC: 33.5 g/dL (ref 30.0–36.0)
MCV: 87.8 fL (ref 80.0–100.0)
Platelets: 188 K/uL (ref 150–400)
RBC: 4.42 MIL/uL (ref 3.87–5.11)
RDW: 13 % (ref 11.5–15.5)
WBC: 6.5 K/uL (ref 4.0–10.5)
nRBC: 0 % (ref 0.0–0.2)

## 2023-08-29 LAB — BASIC METABOLIC PANEL WITH GFR
Anion gap: 11 (ref 5–15)
BUN: 17 mg/dL (ref 8–23)
CO2: 27 mmol/L (ref 22–32)
Calcium: 10.1 mg/dL (ref 8.9–10.3)
Chloride: 104 mmol/L (ref 98–111)
Creatinine, Ser: 0.79 mg/dL (ref 0.44–1.00)
GFR, Estimated: >60 mL/min (ref >60)
Glucose, Bld: 115 mg/dL — ABNORMAL HIGH (ref 70–99)
Potassium: 4.1 mmol/L (ref 3.5–5.1)
Sodium: 142 mmol/L (ref 135–145)

## 2023-08-29 LAB — TROPONIN T, HIGH SENSITIVITY
Troponin T High Sensitivity: <15 ng/L (ref <19)
Troponin T High Sensitivity: <15 ng/L (ref <19)

## 2023-08-29 MED ORDER — IOHEXOL 350 MG/ML SOLN
100.0000 mL | Freq: Once | INTRAVENOUS | Status: AC | PRN
  Administered 2023-08-29: 100 mL via INTRAVENOUS

## 2023-08-29 MED ORDER — KETOROLAC TROMETHAMINE 30 MG/ML IJ SOLN
15.0000 mg | Freq: Once | INTRAMUSCULAR | Status: AC
  Administered 2023-08-29: 15 mg via INTRAVENOUS
  Filled 2023-08-29: qty 1

## 2023-08-29 MED ORDER — ALUM & MAG HYDROXIDE-SIMETH 200-200-20 MG/5ML PO SUSP
30.0000 mL | Freq: Once | ORAL | Status: AC
  Administered 2023-08-29: 30 mL via ORAL
  Filled 2023-08-29: qty 30

## 2023-08-29 MED ORDER — FAMOTIDINE 20 MG PO TABS
20.0000 mg | ORAL_TABLET | Freq: Once | ORAL | Status: AC
  Administered 2023-08-29: 20 mg via ORAL
  Filled 2023-08-29: qty 1

## 2023-08-29 NOTE — ED Provider Notes (Signed)
 [place] EMERGENCY DEPARTMENT AT [facility] Provider Note   CSN: [id number] Arrival date & time: 08/28/23  2339     Patient presents with: Chest Pain   [name] is a 73 y.o. female.   The history is provided by the patient.  Chest Pain Pain location:  L chest Pain quality: dull   Pain radiates to:  R arm Pain severity:  Severe Onset quality:  Sudden Timing:  Constant Progression:  Improving Chronicity:  New Context: at rest   Relieved by:  Nothing Worsened by:  Nothing Ineffective treatments:  Nitroglycerin  Associated symptoms: no claudication, no fever, no headache, no lower extremity edema, no nausea, no numbness, no palpitations, no shortness of breath, no syncope, no vomiting and no weakness   Associated symptoms comment:  Thought it might be indigestion Symptoms started at rest.  No SOB.  No n/v/d.  No travel.  No leg pain but felt under the weather all weekend.  No fevers.     Past Medical History:  Diagnosis Date   Basal cell carcinoma    Hx of cardiac cath    a.  LHC (3/02):  Normal coronary arteries.  Left ventricular function was normal  //  b. LHC 06/29/15 - normal coronary arteries, EF 60-65%   Hx of cardiovascular stress test    a. 7/10 - normal, EF 72% // b. Myoview 4/14 - low risk, apical thinning, no ischemia, EF 80% //  c. ETT-Myoview (11/15):  Normal stress nuclear study. No ischemia.  LV Ejection Fraction: 75%   Hypercholesteremia    Hypertension    Microvascular angina (HCC)    a. possibly - normal cath 2017.   Muscle cramp    Pulmonary nodule    a. by CT 11/2017 with recommendation for f/u 6-12 mo   Sinus problem    Skin cancer    basal cell, squamous cell   Syncope 2008   syncopal episode, probably related to excessive stress, activity, and fatigue         Prior to Admission medications   Medication Sig Start Date End Date Taking? Authorizing Provider  aspirin  EC 81 MG tablet Take 81 mg by mouth at bedtime.     [provider]  BIOTIN PO Take 5,000 mg by mouth daily.    [provider]  Estradiol  (YUVAFEM ) 10 MCG TABS vaginal tablet Place 1 tablet (10 mcg total) vaginally daily for 14 days, then one tablet twice weekly 07/17/23     isosorbide  mononitrate (IMDUR ) 60 MG 24 hr tablet Take 1 tablet (60 mg total) by mouth daily. 07/17/23   Lelon Hamilton T, PA-C  losartan  (COZAAR ) 50 MG tablet Take 1 tablet (50 mg total) by mouth daily. 07/17/23 07/15/24  Lelon Hamilton T, PA-C  MAGNESIUM  PO Take 200 mg by mouth daily.     [provider]  Multiple Vitamins-Minerals (CENTRUM SILVER PO) Take 1 tablet by mouth daily.    [provider]  nitroGLYCERIN  (NITROSTAT ) 0.4 MG SL tablet Place 1 tablet (0.4 mg total) under the tongue every 5 (five) minutes as needed for chest pain. 01/22/23   Verlin Lonni BIRCH, MD  Potassium 99 MG TABS Take 2 tablets by mouth daily.     [provider]  rosuvastatin  (CRESTOR ) 20 MG tablet Take 1 tablet (20 mg total) by mouth at bedtime. 01/25/23   Verlin Lonni BIRCH, MD    Allergies: Adhesive [tape], Aspirin , Lactose intolerance (gi), and Potassium-containing compounds    Review of  Systems  Constitutional:  Negative for fever.  Respiratory:  Negative for shortness of breath.   Cardiovascular:  Positive for chest pain. Negative for palpitations, claudication and syncope.  Gastrointestinal:  Negative for nausea and vomiting.  Neurological:  Negative for weakness, numbness and headaches.    Updated Vital Signs BP (!) 169/89   Pulse (!) 58   Temp 97.9 F (36.6 C) (Temporal)   Resp 17   Ht 5' 4 (1.626 m)   Wt 71.7 kg   SpO2 96%   BMI 27.12 kg/m   Physical Exam Vitals and nursing note reviewed.  Constitutional:      General: She is not in acute distress.    Appearance: Normal appearance. She is well-developed. She is not diaphoretic.  HENT:     Head: Normocephalic and atraumatic.     Nose: Nose normal.  Eyes:     Pupils: Pupils  are equal, round, and reactive to light.  Cardiovascular:     Rate and Rhythm: Normal rate and regular rhythm.     Pulses: Normal pulses.     Heart sounds: Normal heart sounds.  Pulmonary:     Effort: Pulmonary effort is normal. No respiratory distress.     Breath sounds: Normal breath sounds.  Abdominal:     General: There is no distension.     Palpations: Abdomen is soft.     Tenderness: There is no abdominal tenderness. There is no guarding or rebound.     Comments: Gassy throughout   Musculoskeletal:        General: Normal range of motion.     Cervical back: Normal range of motion and neck supple.  Skin:    General: Skin is warm and dry.     Capillary Refill: Capillary refill takes less than 2 seconds.     Findings: No erythema or rash.  Neurological:     General: No focal deficit present.     Mental Status: She is alert and oriented to person, place, and time.     Deep Tendon Reflexes: Reflexes normal.  Psychiatric:        Mood and Affect: Mood normal.     (all labs ordered are listed, but only abnormal results are displayed) Results for orders placed or performed during the hospital encounter of 08/28/23  Basic metabolic panel   Collection Time: 08/28/23 11:53 PM  Result Value Ref Range   Sodium 142 135 - 145 mmol/L   Potassium 4.1 3.5 - 5.1 mmol/L   Chloride 104 98 - 111 mmol/L   CO2 27 22 - 32 mmol/L   Glucose, Bld 115 (H) 70 - 99 mg/dL   BUN 17 8 - 23 mg/dL   Creatinine, Ser 9.20 0.44 - 1.00 mg/dL   Calcium  10.1 8.9 - 10.3 mg/dL   GFR, Estimated >39 >39 mL/min   Anion gap 11 5 - 15  CBC   Collection Time: 08/28/23 11:53 PM  Result Value Ref Range   WBC 6.5 4.0 - 10.5 K/uL   RBC 4.42 3.87 - 5.11 MIL/uL   Hemoglobin 13.0 12.0 - 15.0 g/dL   HCT 61.1 63.9 - 53.9 %   MCV 87.8 80.0 - 100.0 fL   MCH 29.4 26.0 - 34.0 pg   MCHC 33.5 30.0 - 36.0 g/dL   RDW 86.9 88.4 - 84.4 %   Platelets 188 150 - 400 K/uL   nRBC 0.0 0.0 - 0.2 %  Troponin T, High Sensitivity    Collection Time: 08/28/23 11:53 PM  Result Value Ref Range   Troponin T High Sensitivity <15 <19 ng/L  Resp panel by RT-PCR (RSV, Flu A&B, Covid) Anterior Nasal Swab   Collection Time: 08/29/23 12:46 AM   Specimen: Anterior Nasal Swab  Result Value Ref Range   SARS Coronavirus 2 by RT PCR NEGATIVE NEGATIVE   Influenza A by PCR NEGATIVE NEGATIVE   Influenza B by PCR NEGATIVE NEGATIVE   Resp Syncytial Virus by PCR NEGATIVE NEGATIVE   CT Angio Chest/Abd/Pel for Dissection W and/or Wo Contrast Result Date: 08/29/2023 CLINICAL DATA:  Acute aortic syndrome suspected. Sharp central chest pain when sitting. No response to nitroglycerin . EXAM: CT ANGIOGRAPHY CHEST, ABDOMEN AND PELVIS TECHNIQUE: Noncontrast CT chest was initially obtained. Multidetector CT imaging through the chest, abdomen and pelvis was performed using the standard protocol during bolus administration of intravenous contrast. Multiplanar reconstructed images and MIPs were obtained and reviewed to evaluate the vascular anatomy. RADIATION DOSE REDUCTION: This exam was performed according to the departmental dose-optimization program which includes automated exposure control, adjustment of the mA and/or kV according to patient size and/or use of iterative reconstruction technique. CONTRAST:  OMNIPAQUE  IOHEXOL  350 MG/ML SOLN COMPARISON:  Chest radiograph 08/28/2023.  CT chest 12/17/2017 FINDINGS: CTA CHEST FINDINGS Cardiovascular: Unenhanced images of the chest demonstrate minimal scattered calcification of the aorta. Calcified granuloma in the left lung. Minimal calcification of coronary arteries. No acute intramural hematoma. Images obtained during arterial phase after contrast material administration demonstrate normal caliber thoracic aorta. No aneurysm or dissection. Great vessel origins are patent. Normal heart size. No pericardial effusions. Central pulmonary arteries are well opacified. No evidence of significant pulmonary embolus.  Mediastinum/Nodes: No enlarged mediastinal, hilar, or axillary lymph nodes. Thyroid gland, trachea, and esophagus demonstrate no significant findings. Lungs/Pleura: 6 mm ground-glass nodule in the left upper lung, series 7, image 21. No change since previous study. Long-term stability suggests benign etiology and no imaging follow-up is indicated. No airspace disease or consolidation in the lungs. No pleural effusion or pneumothorax. Musculoskeletal: Degenerative changes in the spine. No acute bony abnormalities. Review of the MIP images confirms the above findings. CTA ABDOMEN AND PELVIS FINDINGS VASCULAR Aorta: Normal caliber aorta without aneurysm, dissection, vasculitis or significant stenosis. Calcification of the aorta. Celiac: Patent without evidence of aneurysm, dissection, vasculitis or significant stenosis. SMA: Patent without evidence of aneurysm, dissection, vasculitis or significant stenosis. Renals: Both renal arteries are patent without evidence of aneurysm, dissection, vasculitis, fibromuscular dysplasia or significant stenosis. IMA: Patent without evidence of aneurysm, dissection, vasculitis or significant stenosis. Inflow: Patent without evidence of aneurysm, dissection, vasculitis or significant stenosis. Veins: No obvious venous abnormality within the limitations of this arterial phase study. Review of the MIP images confirms the above findings. NON-VASCULAR Hepatobiliary: No focal liver abnormality is seen. Status post cholecystectomy. No biliary dilatation. Pancreas: Unremarkable. No pancreatic ductal dilatation or surrounding inflammatory changes. Spleen: Normal in size without focal abnormality. Adrenals/Urinary Tract: No adrenal gland nodules. Kidneys are symmetrical. Parapelvic cysts on the left. No imaging follow-up is indicated. No hydronephrosis or hydroureter. No renal, ureteral, or bladder stones. Bladder is normal. Stomach/Bowel: Stomach, small bowel, and colon are not abnormally  distended. No wall thickening or inflammatory changes. Diverticulosis of the sigmoid colon without evidence of acute diverticulitis. Appendix is not identified. Lymphatic: No significant lymphadenopathy. Reproductive: Status post hysterectomy. No adnexal masses. Other: No abdominal wall hernia or abnormality. No abdominopelvic ascites. Musculoskeletal: Degenerative changes in the spine. No acute bony abnormalities. Review of the MIP images confirms the above  findings. IMPRESSION: 1. Aortic atherosclerosis. No evidence of aneurysm or dissection or significant stenosis of the thoracic or abdominal aorta. 2. No evidence of active pulmonary disease. 3. 6 mm nodule in the left upper lung, unchanged since prior study. Five year long-term stability suggests benign etiology. No additional follow-up is indicated. 4. No acute process demonstrated in the abdomen or pelvis. Colonic diverticulosis without evidence of acute diverticulitis. Electronically Signed   By: Elsie Gravely M.D.   On: 08/29/2023 01:32   DG Chest 2 View Result Date: 08/29/2023 CLINICAL DATA:  Chest pain EXAM: CHEST - 2 VIEW COMPARISON:  12/17/2017 FINDINGS: The heart size and mediastinal contours are within normal limits. Both lungs are clear. The visualized skeletal structures are unremarkable. IMPRESSION: No active cardiopulmonary disease. Electronically Signed   By: Franky Crease M.D.   On: 08/29/2023 00:16    EKG: EKG Interpretation Date/Time:  Tuesday August 28 2023 23:46:51 EDT Ventricular Rate:  65 PR Interval:  126 QRS Duration:  70 QT Interval:  390 QTC Calculation: 405 R Axis:   26  Text Interpretation: Normal sinus rhythm Confirmed by Nettie, Palin Tristan ([id number]) on 08/28/2023 11:49:17 PM  Radiology: CT Angio Chest/Abd/Pel for Dissection W and/or Wo Contrast Result Date: 08/29/2023 CLINICAL DATA:  Acute aortic syndrome suspected. Sharp central chest pain when sitting. No response to nitroglycerin . EXAM: CT ANGIOGRAPHY CHEST, ABDOMEN AND  PELVIS TECHNIQUE: Noncontrast CT chest was initially obtained. Multidetector CT imaging through the chest, abdomen and pelvis was performed using the standard protocol during bolus administration of intravenous contrast. Multiplanar reconstructed images and MIPs were obtained and reviewed to evaluate the vascular anatomy. RADIATION DOSE REDUCTION: This exam was performed according to the departmental dose-optimization program which includes automated exposure control, adjustment of the mA and/or kV according to patient size and/or use of iterative reconstruction technique. CONTRAST:  OMNIPAQUE  IOHEXOL  350 MG/ML SOLN COMPARISON:  Chest radiograph 08/28/2023.  CT chest 12/17/2017 FINDINGS: CTA CHEST FINDINGS Cardiovascular: Unenhanced images of the chest demonstrate minimal scattered calcification of the aorta. Calcified granuloma in the left lung. Minimal calcification of coronary arteries. No acute intramural hematoma. Images obtained during arterial phase after contrast material administration demonstrate normal caliber thoracic aorta. No aneurysm or dissection. Great vessel origins are patent. Normal heart size. No pericardial effusions. Central pulmonary arteries are well opacified. No evidence of significant pulmonary embolus. Mediastinum/Nodes: No enlarged mediastinal, hilar, or axillary lymph nodes. Thyroid gland, trachea, and esophagus demonstrate no significant findings. Lungs/Pleura: 6 mm ground-glass nodule in the left upper lung, series 7, image 21. No change since previous study. Long-term stability suggests benign etiology and no imaging follow-up is indicated. No airspace disease or consolidation in the lungs. No pleural effusion or pneumothorax. Musculoskeletal: Degenerative changes in the spine. No acute bony abnormalities. Review of the MIP images confirms the above findings. CTA ABDOMEN AND PELVIS FINDINGS VASCULAR Aorta: Normal caliber aorta without aneurysm, dissection, vasculitis or  significant stenosis. Calcification of the aorta. Celiac: Patent without evidence of aneurysm, dissection, vasculitis or significant stenosis. SMA: Patent without evidence of aneurysm, dissection, vasculitis or significant stenosis. Renals: Both renal arteries are patent without evidence of aneurysm, dissection, vasculitis, fibromuscular dysplasia or significant stenosis. IMA: Patent without evidence of aneurysm, dissection, vasculitis or significant stenosis. Inflow: Patent without evidence of aneurysm, dissection, vasculitis or significant stenosis. Veins: No obvious venous abnormality within the limitations of this arterial phase study. Review of the MIP images confirms the above findings. NON-VASCULAR Hepatobiliary: No focal liver abnormality is seen. Status post cholecystectomy. No  biliary dilatation. Pancreas: Unremarkable. No pancreatic ductal dilatation or surrounding inflammatory changes. Spleen: Normal in size without focal abnormality. Adrenals/Urinary Tract: No adrenal gland nodules. Kidneys are symmetrical. Parapelvic cysts on the left. No imaging follow-up is indicated. No hydronephrosis or hydroureter. No renal, ureteral, or bladder stones. Bladder is normal. Stomach/Bowel: Stomach, small bowel, and colon are not abnormally distended. No wall thickening or inflammatory changes. Diverticulosis of the sigmoid colon without evidence of acute diverticulitis. Appendix is not identified. Lymphatic: No significant lymphadenopathy. Reproductive: Status post hysterectomy. No adnexal masses. Other: No abdominal wall hernia or abnormality. No abdominopelvic ascites. Musculoskeletal: Degenerative changes in the spine. No acute bony abnormalities. Review of the MIP images confirms the above findings. IMPRESSION: 1. Aortic atherosclerosis. No evidence of aneurysm or dissection or significant stenosis of the thoracic or abdominal aorta. 2. No evidence of active pulmonary disease. 3. 6 mm nodule in the left upper  lung, unchanged since prior study. Five year long-term stability suggests benign etiology. No additional follow-up is indicated. 4. No acute process demonstrated in the abdomen or pelvis. Colonic diverticulosis without evidence of acute diverticulitis. Electronically Signed   By: Elsie Gravely M.D.   On: 08/29/2023 01:32   DG Chest 2 View Result Date: 08/29/2023 CLINICAL DATA:  Chest pain EXAM: CHEST - 2 VIEW COMPARISON:  12/17/2017 FINDINGS: The heart size and mediastinal contours are within normal limits. Both lungs are clear. The visualized skeletal structures are unremarkable. IMPRESSION: No active cardiopulmonary disease. Electronically Signed   By: Franky Crease M.D.   On: 08/29/2023 00:16     Procedures   Medications Ordered in the ED  alum & mag hydroxide-simeth (MAALOX/MYLANTA) 200-200-20 MG/5ML suspension 30 mL (30 mLs Oral Given 08/29/23 0049)  iohexol  (OMNIPAQUE ) 350 MG/ML injection 100 mL (100 mLs Intravenous Contrast Given 08/29/23 0115)  ketorolac  (TORADOL ) 30 MG/ML injection 15 mg (15 mg Intravenous Given 08/29/23 0217)  famotidine  (PEPCID ) tablet 20 mg (20 mg Oral Given 08/29/23 0217)                                    Medical Decision Making Over 2 hours of left sided chest and shoulder pain.    Amount and/or Complexity of Data Reviewed Independent Historian: spouse    Details: See above  External Data Reviewed: notes.    Details: Previous notes and cath reviewed  Labs: ordered.    Details: Troponin negative < 15.  Normal sodium 142, normal potassium 4.1, normal creatinine.  Normal white count 6.5, normal hemoglobin 13, normal platelets negative covid and flu  Radiology: ordered and independent interpretation performed.    Details: Negative dissection study by me  ECG/medicine tests: ordered and independent interpretation performed. Decision-making details documented in ED Course.  Risk OTC drugs. Prescription drug management. Risk Details: Ruled out for Mi in the ED.  HEART score is 3 which is low risk with negative catheterization previously. No PE, no Dissection no PNA.  Differential includes GERD and MSK pain.  Patient has improved in the ED.  Stable for discharge with close follow up.  Strict returns.      Final diagnoses:  None  No signs of systemic illness or infection. The patient is nontoxic-appearing on exam and vital signs are within normal limits.  I have reviewed the triage vital signs and the nursing notes. Pertinent labs & imaging results that were available during my care of the patient were reviewed by me  and considered in my medical decision making (see chart for details). After history, exam, and medical workup I feel the patient has been appropriately medically screened and is safe for discharge home. Pertinent diagnoses were discussed with the patient. Patient was given return precautions.     ED Discharge Orders     None          Jerrad Mendibles, MD 08/29/23 9748

## 2024-01-04 ENCOUNTER — Other Ambulatory Visit: Payer: Self-pay | Admitting: Family Medicine

## 2024-01-04 DIAGNOSIS — Z1231 Encounter for screening mammogram for malignant neoplasm of breast: Secondary | ICD-10-CM

## 2024-01-13 ENCOUNTER — Other Ambulatory Visit: Payer: Self-pay | Admitting: Cardiovascular Disease

## 2024-01-14 ENCOUNTER — Other Ambulatory Visit: Payer: Self-pay

## 2024-01-15 ENCOUNTER — Other Ambulatory Visit (HOSPITAL_COMMUNITY): Payer: Self-pay

## 2024-01-15 MED ORDER — ROSUVASTATIN CALCIUM 20 MG PO TABS
20.0000 mg | ORAL_TABLET | Freq: Every day | ORAL | 0 refills | Status: AC
  Filled 2024-01-15: qty 90, 90d supply, fill #0

## 2024-02-04 ENCOUNTER — Ambulatory Visit

## 2024-02-04 ENCOUNTER — Ambulatory Visit
Admission: RE | Admit: 2024-02-04 | Discharge: 2024-02-04 | Disposition: A | Source: Ambulatory Visit | Attending: Family Medicine | Admitting: Family Medicine

## 2024-02-04 DIAGNOSIS — Z1231 Encounter for screening mammogram for malignant neoplasm of breast: Secondary | ICD-10-CM

## 2024-03-06 ENCOUNTER — Other Ambulatory Visit (HOSPITAL_COMMUNITY): Payer: Self-pay

## 2024-03-06 MED ORDER — LOSARTAN POTASSIUM-HCTZ 100-25 MG PO TABS
1.0000 | ORAL_TABLET | Freq: Every day | ORAL | 0 refills | Status: AC
  Filled 2024-03-06: qty 90, 90d supply, fill #0
